# Patient Record
Sex: Female | Born: 1950
Health system: Southern US, Community
[De-identification: ages and names within clinical notes are randomized; demographics above are authoritative.]

## PROBLEM LIST (undated history)

## (undated) DIAGNOSIS — N393 Stress incontinence (female) (male): Secondary | ICD-10-CM

## (undated) DIAGNOSIS — E559 Vitamin D deficiency, unspecified: Secondary | ICD-10-CM

## (undated) DIAGNOSIS — M549 Dorsalgia, unspecified: Secondary | ICD-10-CM

## (undated) DIAGNOSIS — F32A Depression, unspecified: Secondary | ICD-10-CM

## (undated) DIAGNOSIS — H9190 Unspecified hearing loss, unspecified ear: Secondary | ICD-10-CM

## (undated) DIAGNOSIS — S62109A Fracture of unspecified carpal bone, unspecified wrist, initial encounter for closed fracture: Secondary | ICD-10-CM

## (undated) DIAGNOSIS — J189 Pneumonia, unspecified organism: Secondary | ICD-10-CM

## (undated) DIAGNOSIS — K219 Gastro-esophageal reflux disease without esophagitis: Secondary | ICD-10-CM

## (undated) DIAGNOSIS — M810 Age-related osteoporosis without current pathological fracture: Secondary | ICD-10-CM

## (undated) DIAGNOSIS — Z8709 Personal history of other diseases of the respiratory system: Secondary | ICD-10-CM

## (undated) DIAGNOSIS — M1990 Unspecified osteoarthritis, unspecified site: Secondary | ICD-10-CM

## (undated) DIAGNOSIS — J449 Chronic obstructive pulmonary disease, unspecified: Secondary | ICD-10-CM

## (undated) DIAGNOSIS — T7840XA Allergy, unspecified, initial encounter: Secondary | ICD-10-CM

## (undated) DIAGNOSIS — D689 Coagulation defect, unspecified: Secondary | ICD-10-CM

## (undated) DIAGNOSIS — K760 Fatty (change of) liver, not elsewhere classified: Secondary | ICD-10-CM

## (undated) DIAGNOSIS — F419 Anxiety disorder, unspecified: Secondary | ICD-10-CM

## (undated) DIAGNOSIS — E7800 Pure hypercholesterolemia, unspecified: Secondary | ICD-10-CM

## (undated) DIAGNOSIS — R7303 Prediabetes: Secondary | ICD-10-CM

## (undated) DIAGNOSIS — D649 Anemia, unspecified: Secondary | ICD-10-CM

## (undated) DIAGNOSIS — R112 Nausea with vomiting, unspecified: Secondary | ICD-10-CM

## (undated) DIAGNOSIS — I2699 Other pulmonary embolism without acute cor pulmonale: Secondary | ICD-10-CM

## (undated) DIAGNOSIS — K5900 Constipation, unspecified: Secondary | ICD-10-CM

## (undated) DIAGNOSIS — Z780 Asymptomatic menopausal state: Secondary | ICD-10-CM

## (undated) DIAGNOSIS — M2550 Pain in unspecified joint: Secondary | ICD-10-CM

## (undated) DIAGNOSIS — J329 Chronic sinusitis, unspecified: Secondary | ICD-10-CM

## (undated) DIAGNOSIS — T8859XA Other complications of anesthesia, initial encounter: Secondary | ICD-10-CM

## (undated) DIAGNOSIS — I10 Essential (primary) hypertension: Secondary | ICD-10-CM

## (undated) DIAGNOSIS — F1010 Alcohol abuse, uncomplicated: Secondary | ICD-10-CM

## (undated) DIAGNOSIS — T4145XA Adverse effect of unspecified anesthetic, initial encounter: Secondary | ICD-10-CM

## (undated) DIAGNOSIS — F909 Attention-deficit hyperactivity disorder, unspecified type: Secondary | ICD-10-CM

## (undated) DIAGNOSIS — Z87442 Personal history of urinary calculi: Secondary | ICD-10-CM

## (undated) DIAGNOSIS — Z973 Presence of spectacles and contact lenses: Secondary | ICD-10-CM

## (undated) DIAGNOSIS — I82409 Acute embolism and thrombosis of unspecified deep veins of unspecified lower extremity: Secondary | ICD-10-CM

## (undated) DIAGNOSIS — Z9889 Other specified postprocedural states: Secondary | ICD-10-CM

## (undated) DIAGNOSIS — F1910 Other psychoactive substance abuse, uncomplicated: Secondary | ICD-10-CM

## (undated) DIAGNOSIS — F329 Major depressive disorder, single episode, unspecified: Secondary | ICD-10-CM

## (undated) HISTORY — DX: Anemia, unspecified: D64.9

## (undated) HISTORY — DX: Fatty (change of) liver, not elsewhere classified: K76.0

## (undated) HISTORY — DX: Depression, unspecified: F32.A

## (undated) HISTORY — DX: Major depressive disorder, single episode, unspecified: F32.9

## (undated) HISTORY — DX: Alcohol abuse, uncomplicated: F10.10

## (undated) HISTORY — DX: Vitamin D deficiency, unspecified: E55.9

## (undated) HISTORY — DX: Dorsalgia, unspecified: M54.9

## (undated) HISTORY — PX: CYSTECTOMY: SUR359

## (undated) HISTORY — DX: Prediabetes: R73.03

## (undated) HISTORY — DX: Pain in unspecified joint: M25.50

## (undated) HISTORY — PX: CARPAL TUNNEL RELEASE: SHX101

## (undated) HISTORY — DX: Constipation, unspecified: K59.00

## (undated) HISTORY — DX: Coagulation defect, unspecified: D68.9

## (undated) HISTORY — DX: Chronic sinusitis, unspecified: J32.9

## (undated) HISTORY — PX: COLONOSCOPY: SHX174

## (undated) HISTORY — DX: Age-related osteoporosis without current pathological fracture: M81.0

## (undated) HISTORY — DX: Other psychoactive substance abuse, uncomplicated: F19.10

## (undated) HISTORY — DX: Essential (primary) hypertension: I10

## (undated) HISTORY — DX: Allergy, unspecified, initial encounter: T78.40XA

## (undated) HISTORY — DX: Fracture of unspecified carpal bone, unspecified wrist, initial encounter for closed fracture: S62.109A

## (undated) HISTORY — DX: Pure hypercholesterolemia, unspecified: E78.00

## (undated) HISTORY — DX: Asymptomatic menopausal state: Z78.0

## (undated) HISTORY — DX: Unspecified osteoarthritis, unspecified site: M19.90

## (undated) HISTORY — PX: COSMETIC SURGERY: SHX468

## (undated) HISTORY — DX: Anxiety disorder, unspecified: F41.9

---
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
## 1983-01-09 HISTORY — PX: BREAST SURGERY: SHX581

## 1997-07-02 ENCOUNTER — Ambulatory Visit (HOSPITAL_COMMUNITY): Admission: RE | Admit: 1997-07-02 | Discharge: 1997-07-02 | Payer: Self-pay | Admitting: Orthopedic Surgery

## 1997-11-10 ENCOUNTER — Ambulatory Visit (HOSPITAL_BASED_OUTPATIENT_CLINIC_OR_DEPARTMENT_OTHER): Admission: RE | Admit: 1997-11-10 | Discharge: 1997-11-10 | Payer: Self-pay | Admitting: Orthopedic Surgery

## 1998-01-08 HISTORY — PX: NASAL SINUS SURGERY: SHX719

## 1999-07-20 ENCOUNTER — Other Ambulatory Visit: Admission: RE | Admit: 1999-07-20 | Discharge: 1999-07-20 | Payer: Self-pay | Admitting: Gynecology

## 1999-08-02 ENCOUNTER — Other Ambulatory Visit: Admission: RE | Admit: 1999-08-02 | Discharge: 1999-08-02 | Payer: Self-pay | Admitting: Gynecology

## 1999-08-29 ENCOUNTER — Ambulatory Visit (HOSPITAL_COMMUNITY): Admission: RE | Admit: 1999-08-29 | Discharge: 1999-08-29 | Payer: Self-pay | Admitting: Gynecology

## 1999-08-29 ENCOUNTER — Encounter (INDEPENDENT_AMBULATORY_CARE_PROVIDER_SITE_OTHER): Payer: Self-pay | Admitting: Specialist

## 2000-09-27 ENCOUNTER — Other Ambulatory Visit: Admission: RE | Admit: 2000-09-27 | Discharge: 2000-09-27 | Payer: Self-pay | Admitting: Gynecology

## 2001-10-14 ENCOUNTER — Other Ambulatory Visit: Admission: RE | Admit: 2001-10-14 | Discharge: 2001-10-14 | Payer: Self-pay | Admitting: Gynecology

## 2003-02-02 ENCOUNTER — Other Ambulatory Visit: Admission: RE | Admit: 2003-02-02 | Discharge: 2003-02-02 | Payer: Self-pay | Admitting: Family Medicine

## 2003-02-05 ENCOUNTER — Encounter: Admission: RE | Admit: 2003-02-05 | Discharge: 2003-02-05 | Payer: Self-pay | Admitting: Family Medicine

## 2003-02-16 ENCOUNTER — Encounter: Payer: Self-pay | Admitting: Internal Medicine

## 2003-08-19 ENCOUNTER — Encounter: Admission: RE | Admit: 2003-08-19 | Discharge: 2003-08-19 | Payer: Self-pay | Admitting: Family Medicine

## 2005-06-06 ENCOUNTER — Ambulatory Visit: Payer: Self-pay | Admitting: Oncology

## 2005-06-21 LAB — CBC WITH DIFFERENTIAL/PLATELET
BASO%: 0.3 % (ref 0.0–2.0)
Basophils Absolute: 0 10*3/uL (ref 0.0–0.1)
EOS%: 2.9 % (ref 0.0–7.0)
Eosinophils Absolute: 0.1 10*3/uL (ref 0.0–0.5)
HCT: 34.6 % — ABNORMAL LOW (ref 34.8–46.6)
HGB: 11.5 g/dL — ABNORMAL LOW (ref 11.6–15.9)
LYMPH%: 33.3 % (ref 14.0–48.0)
MCH: 31 pg (ref 26.0–34.0)
MCHC: 33.2 g/dL (ref 32.0–36.0)
MCV: 93.6 fL (ref 81.0–101.0)
MONO#: 0.4 10*3/uL (ref 0.1–0.9)
MONO%: 8.3 % (ref 0.0–13.0)
NEUT#: 2.4 10*3/uL (ref 1.5–6.5)
NEUT%: 55.2 % (ref 39.6–76.8)
Platelets: 255 10*3/uL (ref 145–400)
RBC: 3.7 10*6/uL (ref 3.70–5.32)
RDW: 13.4 % (ref 11.3–14.5)
WBC: 4.3 10*3/uL (ref 3.9–10.0)
lymph#: 1.4 10*3/uL (ref 0.9–3.3)

## 2005-06-21 LAB — CHCC SMEAR

## 2005-06-25 LAB — COMPREHENSIVE METABOLIC PANEL
ALT: 15 U/L (ref 0–40)
AST: 18 U/L (ref 0–37)
Albumin: 4.4 g/dL (ref 3.5–5.2)
Alkaline Phosphatase: 66 U/L (ref 39–117)
BUN: 13 mg/dL (ref 6–23)
CO2: 29 mEq/L (ref 19–32)
Calcium: 9 mg/dL (ref 8.4–10.5)
Chloride: 103 mEq/L (ref 96–112)
Creatinine, Ser: 0.88 mg/dL (ref 0.40–1.20)
Glucose, Bld: 108 mg/dL — ABNORMAL HIGH (ref 70–99)
Potassium: 4.2 mEq/L (ref 3.5–5.3)
Sodium: 143 mEq/L (ref 135–145)
Total Bilirubin: 0.4 mg/dL (ref 0.3–1.2)
Total Protein: 6.7 g/dL (ref 6.0–8.3)

## 2005-06-25 LAB — IGG, IGA, IGM
IgA: 138 mg/dL (ref 68–378)
IgG (Immunoglobin G), Serum: 866 mg/dL (ref 694–1618)
IgM, Serum: 106 mg/dL (ref 60–263)

## 2005-06-25 LAB — LACTATE DEHYDROGENASE: LDH: 191 U/L (ref 94–250)

## 2005-06-25 LAB — HEMOGLOBINOPATHY EVALUATION
Hgb A2 Quant: 2.4 % (ref 2.2–3.2)
Hgb A: 97.6 % (ref 96.8–97.8)
Hgb F Quant: 0 % (ref 0.0–0.5)
Hgb S Quant: 0 % (ref 0.0–0.0)

## 2005-06-25 LAB — IRON AND TIBC
%SAT: 24 % (ref 20–55)
Iron: 84 ug/dL (ref 42–145)
TIBC: 353 ug/dL (ref 250–470)
UIBC: 269 ug/dL

## 2005-06-25 LAB — FERRITIN: Ferritin: 55 ng/mL (ref 10–291)

## 2005-06-25 LAB — ERYTHROPOIETIN: Erythropoietin: 21.2 m[IU]/mL (ref 2.6–34.0)

## 2005-06-25 LAB — FOLATE: Folate: 18.5 ng/mL

## 2005-12-10 ENCOUNTER — Ambulatory Visit: Payer: Self-pay | Admitting: Oncology

## 2005-12-12 LAB — CBC WITH DIFFERENTIAL/PLATELET
BASO%: 1.5 % (ref 0.0–2.0)
Basophils Absolute: 0.1 10*3/uL (ref 0.0–0.1)
EOS%: 2.7 % (ref 0.0–7.0)
Eosinophils Absolute: 0.2 10*3/uL (ref 0.0–0.5)
HCT: 34.5 % — ABNORMAL LOW (ref 34.8–46.6)
HGB: 11.6 g/dL (ref 11.6–15.9)
LYMPH%: 24.9 % (ref 14.0–48.0)
MCH: 32 pg (ref 26.0–34.0)
MCHC: 33.7 g/dL (ref 32.0–36.0)
MCV: 95 fL (ref 81.0–101.0)
MONO#: 0.6 10*3/uL (ref 0.1–0.9)
MONO%: 8.1 % (ref 0.0–13.0)
NEUT#: 4.5 10*3/uL (ref 1.5–6.5)
NEUT%: 62.8 % (ref 39.6–76.8)
Platelets: 269 10*3/uL (ref 145–400)
RBC: 3.64 10*6/uL — ABNORMAL LOW (ref 3.70–5.32)
RDW: 14.2 % (ref 11.3–14.5)
WBC: 7.2 10*3/uL (ref 3.9–10.0)
lymph#: 1.8 10*3/uL (ref 0.9–3.3)

## 2007-04-19 ENCOUNTER — Emergency Department (HOSPITAL_COMMUNITY): Admission: EM | Admit: 2007-04-19 | Discharge: 2007-04-20 | Payer: Self-pay | Admitting: Emergency Medicine

## 2007-04-25 ENCOUNTER — Encounter: Payer: Self-pay | Admitting: Internal Medicine

## 2007-04-25 ENCOUNTER — Ambulatory Visit (HOSPITAL_COMMUNITY): Admission: RE | Admit: 2007-04-25 | Discharge: 2007-04-25 | Payer: Self-pay | Admitting: Internal Medicine

## 2007-05-05 ENCOUNTER — Telehealth: Payer: Self-pay | Admitting: Internal Medicine

## 2007-05-06 ENCOUNTER — Ambulatory Visit: Payer: Self-pay | Admitting: Internal Medicine

## 2007-06-18 DIAGNOSIS — K449 Diaphragmatic hernia without obstruction or gangrene: Secondary | ICD-10-CM | POA: Insufficient documentation

## 2007-06-18 DIAGNOSIS — K7689 Other specified diseases of liver: Secondary | ICD-10-CM | POA: Insufficient documentation

## 2007-06-23 ENCOUNTER — Ambulatory Visit: Payer: Self-pay | Admitting: Internal Medicine

## 2007-12-27 ENCOUNTER — Emergency Department (HOSPITAL_COMMUNITY): Admission: EM | Admit: 2007-12-27 | Discharge: 2007-12-27 | Payer: Self-pay | Admitting: Emergency Medicine

## 2008-01-07 ENCOUNTER — Telehealth: Payer: Self-pay | Admitting: Internal Medicine

## 2008-02-04 ENCOUNTER — Telehealth: Payer: Self-pay | Admitting: Gastroenterology

## 2008-02-06 ENCOUNTER — Encounter: Admission: RE | Admit: 2008-02-06 | Discharge: 2008-02-06 | Payer: Self-pay | Admitting: Family Medicine

## 2008-02-06 ENCOUNTER — Telehealth: Payer: Self-pay | Admitting: Internal Medicine

## 2010-05-26 NOTE — Op Note (Signed)
[facility]  Patient:    Sharon Daniels, Sharon Daniels                  MRN: [id number] Proc. Date: 08/29/99 Adm. Date:  [id number] Attending:  Merrily Pew                           Operative Report  PREOPERATIVE DIAGNOSIS:       Menorrhagia.  POSTOPERATIVE DIAGNOSIS:      Endometrial polyp to rule out submucous myoma.  PROCEDURE:                    Hysteroscopic resection endometrial polyp.  SURGEON:                      Timothy P. Fontaine, M.D.  ANESTHESIA:                   General.  SPECIMEN:                     1. Endometrial polyps.                               2. Endometrial curetting.                               3. ______.  FLUIDS:                       Sorbitol discrepancy approximately 100 cc.  ESTIMATED BLOOD LOSS:         Minimal.  COMPLICATIONS:                None.  FINDINGS:                     Multiple endometrial polyps filling the endometrial cavity with some rounded polypoid changes consistent either with small submucous myomas versus sessile polyps.  At the end of the procedure cavity was empty.  Right and left tubal ostia identified.  Fundus, anterior/posterior surfaces, lower uterine segment, endometrial cervical canal all normal.  PROCEDURE:                    Patient underwent general anesthesia.  Was placed in the low dorsal lithotomy position.  Received a perineal vaginal preparation of Betadine scrub and solution and the bladder emptied with in-and-out Foley catheterization by nursing personnel.  Patient was draped in the usual fashion.  Cervix was visualized with a speculum and the anterior lip grasped with a single tooth tenaculum.  The cervix was gently dilated to admit the diagnostic hysteroscope and hysteroscopy was performed with findings of multiple polyps filling the cavity.  The diagnostic hysteroscope was removed and the operative hysteroscope was then replaced with a right angle resectoscopic loop  and through progressive passes the endometrial polyps and sessile changes were excised and sent as a separate specimen.  Subsequently, a sharp curettage was performed with abundant return.  This was initially sent as one specimen and then multiple passes with the polyp forceps removed the residual scrapings within the endometrial cavity and these were sent as a second specimen.  Rehysteroscopy showed an empty cavity with good distention. No evidence of perforation.  The instruments were removed.  Cervix showed adequate hemostasis.  Patient  subsequently awakened, placed in the supine position, and taken to recovery room in good condition having tolerated procedure well.  Of note, initial setup of the tubing from the patient was not connected to the aspirator and initial Sorbitol irrigation was on the operating floor leading to an estimation of the Sorbitol absorption which was reported at 100 cc, although most likely was less than. DD:  08/29/99 TD:  08/29/99 Job: [id number]

## 2010-05-26 NOTE — H&P (Signed)
[facility]  Patient:    Sharon Daniels, Sharon Daniels                    MRN: [id number] Adm. Date:  08/29/99 Attending:  Marcial Pacas P. Fontaine, M.D.                         History and Physical  CHIEF COMPLAINT:              Menorrhagia.  HISTORY OF PRESENT ILLNESS:   A 60 year old G3 P2 AB1 female with history of heavy menses and anemia over the last several months.  The patient underwent ultrasound, which showed a thickened endometrial cavity measuring 16 mm.  She is scheduled for a hysteroscopy D&C.  Patient had begun on Megace 20 mg b.i.d. and has been continued on this, due to her heavy bleeding with her anemia with a hemoglobin in the 9 range, and she is going to continue on this through her hysteroscopy D&C.  PAST SURGICAL HISTORY:        Cesarean section x 2, arthroscopic knee surgery, and sinus surgery.  MEDICATIONS:                  Flonase and Megace.  ALLERGIES:                    None.  HABITS:                       Cigarettes positive, a pack per day.  Alcohol - none.  REVIEW OF SYSTEMS:            Noncontributory.  FAMILY HISTORY:               Noncontributory, noting she does have a history of breast cancer in her mother.  ADMISSION PHYSICAL EXAMINATION:  VITAL SIGNS:                  Afebrile.  Vital signs are stable.  HEENT:                        Normal.  LUNGS:                        Clear.  CARDIAC:                      Regular rate, no rubs, murmurs, or gallops.  ABDOMEN:                      Benign.  PELVIC:                       External, BUS, vagina normal.  Cervix grossly normal.  Uterus normal size, nontender.  Adnexa without masses or tenderness.  ASSESSMENT:                   A 60 year old G3 P2 AB1 female, history of menorrhagia, with intermenstrual bleeding of the last several months, with heavy flow and anemia.  Currently on Megace for bleeding control.  For hysteroscopy D&C.  Patient had an ultrasound which  showed a thickened endometrial cavity, and the options and possibilities, to include submucous myoma, polyps, as well as hyperplastic changes, was discussed with her.  She did undergo a Pipelle biopsy in the office, which showed benign disordered proliferative pattern with  stromal breakdown.  There was one area of simple hyperplasia suggested, without atypia.  The procedure of hysteroscopy D&C was discussed with the patient, to include instrumentation, possible resectoscopic surgery.  The risks, to include bleeding, infection, transfusion, uterine perforation, damage to internal organs, necessitating major exploratory reparative surgeries and future reparative surgeries including ostomy formation was all discussed with her, understood, and accepted.  The patients questions were answered to her satisfaction, and she is ready to proceed with surgery.DD:  08/28/99 TD:  08/28/99 Job: [id number]

## 2010-10-03 LAB — CLOTEST (H. PYLORI), BIOPSY: Helicobacter screen: NEGATIVE

## 2010-10-03 LAB — COMPREHENSIVE METABOLIC PANEL
ALT: 18
AST: 33
Albumin: 4.1
Alkaline Phosphatase: 78
BUN: 9
CO2: 25
Calcium: 9.1
Chloride: 107
Creatinine, Ser: 0.67
GFR calc Af Amer: >60
GFR calc non Af Amer: >60
Glucose, Bld: 126 — ABNORMAL HIGH
Potassium: 4.1
Sodium: 138
Total Bilirubin: 0.9
Total Protein: 6.6

## 2010-10-03 LAB — POCT CARDIAC MARKERS
CKMB, poc: 1.7
Myoglobin, poc: 46.2
Operator id: 1192
Troponin i, poc: <0.05

## 2010-10-03 LAB — URINALYSIS, ROUTINE W REFLEX MICROSCOPIC
Bilirubin Urine: NEGATIVE
Glucose, UA: NEGATIVE
Hgb urine dipstick: NEGATIVE
Ketones, ur: 15 — AB
Nitrite: NEGATIVE
Protein, ur: NEGATIVE
Specific Gravity, Urine: 1.018
Urobilinogen, UA: 0.2
pH: 6.5

## 2010-10-03 LAB — LIPASE, BLOOD: Lipase: 22

## 2011-01-09 HISTORY — PX: COSMETIC SURGERY: SHX468

## 2012-03-30 ENCOUNTER — Ambulatory Visit (INDEPENDENT_AMBULATORY_CARE_PROVIDER_SITE_OTHER): Payer: 59 | Admitting: Family Medicine

## 2012-03-30 VITALS — BP 122/72 | HR 92 | Temp 98.1°F | Resp 16 | Ht 68.0 in | Wt 208.0 lb

## 2012-03-30 DIAGNOSIS — R059 Cough, unspecified: Secondary | ICD-10-CM

## 2012-03-30 DIAGNOSIS — R05 Cough: Secondary | ICD-10-CM

## 2012-03-30 DIAGNOSIS — J0100 Acute maxillary sinusitis, unspecified: Secondary | ICD-10-CM

## 2012-03-30 MED ORDER — IPRATROPIUM BROMIDE 0.03 % NA SOLN: 2.0000 | Freq: Two times a day (BID) | NASAL | Status: DC

## 2012-03-30 MED ORDER — FLUTICASONE PROPIONATE 50 MCG/ACT NA SUSP: 2.0000 | Freq: Every day | NASAL | Status: DC

## 2012-03-30 MED ORDER — AMOXICILLIN-POT CLAVULANATE 875-125 MG PO TABS: 1.0000 | ORAL_TABLET | Freq: Two times a day (BID) | ORAL | Status: DC

## 2012-03-30 NOTE — Progress Notes (Signed)
[address]   [place], [place]  [address]   [phone]  Subjective:    Patient ID: Sharon Daniels, female    DOB: 1950-10-22, 62 y.o.   MRN: [id number]  HPI This 62 y.o. female presents for evaluation of cough, congestion.  Onset of symptoms ten days ago.  History of sinus surgery in past.  No improvement.  +clammy; no fever; +sweats; + chills.  Slept all day yesterday.  +HA.  +ST constant initially but now intermittent.  No ear pain.  Scant rhinorrhea until started taking Mucinex.  +nasal congestion; drainage is thick.  +coughing with green sputum.  +PND.  Foul odor to sputum.  +SOB mild.  No wheezing; using inhaler twice from baseline.  No v/d.  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  No tobacco; quit 12/30/11; has gained eight pounds.  Taking Mucinex-D and Excedrin, Tylenol, Ibuprofen.  S/p flu vaccine.  Started Singulair two weeks ago.  No history of asthma but will wheeze in the Spring with allergies.  PCP:  Andy/[facility]  Review of Systems  Constitutional: Negative for chills, diaphoresis and fatigue.  HENT: Positive for congestion, rhinorrhea, sneezing, voice change, postnasal drip and sinus pressure. Negative for ear pain, sore throat and trouble swallowing.   Respiratory: Positive for cough and shortness of breath. Negative for wheezing.   Gastrointestinal: Negative for nausea, vomiting and diarrhea.  Skin: Negative for rash.  Neurological: Positive for headaches. Negative for dizziness and light-headedness.        Past Medical History  Diagnosis Date  . Allergy   . Arthritis   . Depression   . Substance abuse   . Anemia   . Hypertension     Past Surgical History  Procedure Laterality Date  . Cosmetic surgery      Prior to Admission medications   Medication Sig Start Date End Date Taking? Authorizing Provider  citalopram (CELEXA) 40 MG tablet Take 40 mg by mouth daily.   Yes Historical Provider, MD  estradiol (ESTRACE) 0.1 MG/GM vaginal cream Place 2 g vaginally  daily.   Yes Historical Provider, MD  valsartan (DIOVAN) 80 MG tablet Take 80 mg by mouth daily.   Yes Historical Provider, MD  amoxicillin-clavulanate (AUGMENTIN) 875-125 MG per tablet Take 1 tablet by mouth 2 (two) times daily. 03/30/12   Ethelda Chick, MD  fluticasone (FLONASE) 50 MCG/ACT nasal spray Place 2 sprays into the nose daily. 03/30/12   Ethelda Chick, MD  ipratropium (ATROVENT) 0.03 % nasal spray Place 2 sprays into the nose 2 (two) times daily. 03/30/12   Ethelda Chick, MD    Allergies  Allergen Reactions  . Morphine     REACTION: rash    History   Social History  . Marital Status: Married    Spouse Name: N/A    Number of Children: N/A  . Years of Education: N/A   Occupational History  . Social Worker    Social History Main Topics  . Smoking status: Former Smoker    Types: Cigarettes    Quit date: 12/30/2011  . Smokeless tobacco: Not on file  . Alcohol Use: No  . Drug Use: No  . Sexually Active: Not on file   Other Topics Concern  . Not on file   Social History Narrative  . No narrative on file    Family History  Problem Relation Age of Onset  . Kidney disease Mother   . Atrial fibrillation Mother   . Heart attack Father  Objective:   Physical Exam  Nursing note and vitals reviewed. Constitutional: She is oriented to person, place, and time. She appears well-developed and well-nourished. No distress.  HENT:  Head: Normocephalic and atraumatic.  Right Ear: External ear normal.  Left Ear: External ear normal.  Nose: Mucosal edema and rhinorrhea present. Right sinus exhibits maxillary sinus tenderness. Right sinus exhibits no frontal sinus tenderness. Left sinus exhibits maxillary sinus tenderness. Left sinus exhibits no frontal sinus tenderness.  Mouth/Throat: Oropharynx is clear and moist.  Eyes: Conjunctivae are normal. Pupils are equal, round, and reactive to light.  Neck: Normal range of motion. Neck supple. No thyromegaly present.    Cardiovascular: Normal rate, regular rhythm and normal heart sounds.  Exam reveals no gallop and no friction rub.   No murmur heard. Pulmonary/Chest: Effort normal and breath sounds normal. No respiratory distress. She has no wheezes. She has no rales.  Lymphadenopathy:    She has cervical adenopathy.  Neurological: She is alert and oriented to person, place, and time.  Skin: Skin is warm and dry. No rash noted. She is not diaphoretic.  Psychiatric: She has a normal mood and affect. Her behavior is normal.       Assessment & Plan:  Acute maxillary sinusitis - Plan: amoxicillin-clavulanate (AUGMENTIN) 875-125 MG per tablet, fluticasone (FLONASE) 50 MCG/ACT nasal spray, ipratropium (ATROVENT) 0.03 % nasal spray  Cough    1. Acute Maxillary Sinusitis:  New.  Rx for Augmentin, Flonase, Atrovent nasal spray provided.  Supportive care with rest, fluids. 2.  Cough:  New.  Continue Mucinex D; also continue Albuterol bid for next week.  Supportive care with rest, fluids.    Meds ordered this encounter  Medications  . citalopram (CELEXA) 40 MG tablet    Sig: Take 40 mg by mouth daily.  Marland Kitchen estradiol (ESTRACE) 0.1 MG/GM vaginal cream    Sig: Place 2 g vaginally daily.  . valsartan (DIOVAN) 80 MG tablet    Sig: Take 80 mg by mouth daily.  Marland Kitchen amoxicillin-clavulanate (AUGMENTIN) 875-125 MG per tablet    Sig: Take 1 tablet by mouth 2 (two) times daily.    Dispense:  20 tablet    Refill:  0  . fluticasone (FLONASE) 50 MCG/ACT nasal spray    Sig: Place 2 sprays into the nose daily.    Dispense:  16 g    Refill:  6  . ipratropium (ATROVENT) 0.03 % nasal spray    Sig: Place 2 sprays into the nose 2 (two) times daily.    Dispense:  30 mL    Refill:  5

## 2012-03-30 NOTE — Patient Instructions (Addendum)
Acute maxillary sinusitis - Plan: amoxicillin-clavulanate (AUGMENTIN) 875-125 MG per tablet, fluticasone (FLONASE) 50 MCG/ACT nasal spray, ipratropium (ATROVENT) 0.03 % nasal spray

## 2013-01-08 DIAGNOSIS — I82409 Acute embolism and thrombosis of unspecified deep veins of unspecified lower extremity: Secondary | ICD-10-CM

## 2013-01-08 HISTORY — DX: Acute embolism and thrombosis of unspecified deep veins of unspecified lower extremity: I82.409

## 2013-01-12 ENCOUNTER — Ambulatory Visit (INDEPENDENT_AMBULATORY_CARE_PROVIDER_SITE_OTHER): Payer: 59 | Admitting: Family Medicine

## 2013-01-12 DIAGNOSIS — Z713 Dietary counseling and surveillance: Secondary | ICD-10-CM

## 2013-01-14 ENCOUNTER — Encounter: Payer: Self-pay | Admitting: Internal Medicine

## 2013-05-09 ENCOUNTER — Other Ambulatory Visit: Payer: Self-pay | Admitting: Orthopedic Surgery

## 2013-05-25 ENCOUNTER — Encounter (HOSPITAL_COMMUNITY): Payer: Self-pay | Admitting: Pharmacy Technician

## 2013-05-27 ENCOUNTER — Ambulatory Visit (HOSPITAL_COMMUNITY)
Admission: RE | Admit: 2013-05-27 | Discharge: 2013-05-27 | Disposition: A | Discharge status: Home / Self Care | Payer: 59 | Source: Ambulatory Visit | Attending: Anesthesiology | Admitting: Anesthesiology

## 2013-05-27 ENCOUNTER — Encounter (HOSPITAL_COMMUNITY): Payer: Self-pay

## 2013-05-27 ENCOUNTER — Encounter (INDEPENDENT_AMBULATORY_CARE_PROVIDER_SITE_OTHER): Payer: Self-pay

## 2013-05-27 ENCOUNTER — Encounter (HOSPITAL_COMMUNITY)
Admission: RE | Admit: 2013-05-27 | Discharge: 2013-05-27 | Disposition: A | Discharge status: Home / Self Care | Payer: 59 | Source: Ambulatory Visit | Attending: Orthopedic Surgery | Admitting: Orthopedic Surgery

## 2013-05-27 DIAGNOSIS — Z01812 Encounter for preprocedural laboratory examination: Secondary | ICD-10-CM | POA: Insufficient documentation

## 2013-05-27 DIAGNOSIS — Z87891 Personal history of nicotine dependence: Secondary | ICD-10-CM | POA: Insufficient documentation

## 2013-05-27 DIAGNOSIS — Z01818 Encounter for other preprocedural examination: Secondary | ICD-10-CM | POA: Insufficient documentation

## 2013-05-27 DIAGNOSIS — Z01810 Encounter for preprocedural cardiovascular examination: Secondary | ICD-10-CM | POA: Insufficient documentation

## 2013-05-27 HISTORY — DX: Pneumonia, unspecified organism: J18.9

## 2013-05-27 HISTORY — DX: Adverse effect of unspecified anesthetic, initial encounter: T41.45XA

## 2013-05-27 HISTORY — DX: Personal history of other diseases of the respiratory system: Z87.09

## 2013-05-27 HISTORY — DX: Personal history of urinary calculi: Z87.442

## 2013-05-27 HISTORY — DX: Other complications of anesthesia, initial encounter: T88.59XA

## 2013-05-27 LAB — COMPREHENSIVE METABOLIC PANEL
ALT: 18 U/L (ref 0–35)
AST: 18 U/L (ref 0–37)
Albumin: 3.9 g/dL (ref 3.5–5.2)
Alkaline Phosphatase: 84 U/L (ref 39–117)
BUN: 19 mg/dL (ref 6–23)
CALCIUM: 9.2 mg/dL (ref 8.4–10.5)
CO2: 28 meq/L (ref 19–32)
Chloride: 104 mEq/L (ref 96–112)
Creatinine, Ser: 0.73 mg/dL (ref 0.50–1.10)
GLUCOSE: 99 mg/dL (ref 70–99)
Potassium: 4.4 mEq/L (ref 3.7–5.3)
Sodium: 142 mEq/L (ref 137–147)
Total Protein: 6.7 g/dL (ref 6.0–8.3)

## 2013-05-27 LAB — PROTIME-INR
INR: 1.01 (ref 0.00–1.49)
Prothrombin Time: 13.1 seconds (ref 11.6–15.2)

## 2013-05-27 LAB — APTT: aPTT: 28 seconds (ref 24–37)

## 2013-05-27 LAB — CBC
HCT: 32.9 % — ABNORMAL LOW (ref 36.0–46.0)
HEMOGLOBIN: 10.9 g/dL — AB (ref 12.0–15.0)
MCH: 31.1 pg (ref 26.0–34.0)
MCHC: 33.1 g/dL (ref 30.0–36.0)
MCV: 93.7 fL (ref 78.0–100.0)
PLATELETS: 235 10*3/uL (ref 150–400)
RBC: 3.51 MIL/uL — ABNORMAL LOW (ref 3.87–5.11)
RDW: 13.9 % (ref 11.5–15.5)
WBC: 5.8 10*3/uL (ref 4.0–10.5)

## 2013-05-27 LAB — URINALYSIS, ROUTINE W REFLEX MICROSCOPIC
Bilirubin Urine: NEGATIVE
GLUCOSE, UA: NEGATIVE mg/dL
Hgb urine dipstick: NEGATIVE
Ketones, ur: NEGATIVE mg/dL
LEUKOCYTES UA: NEGATIVE
NITRITE: NEGATIVE
PROTEIN: NEGATIVE mg/dL
Specific Gravity, Urine: 1.025 (ref 1.005–1.030)
Urobilinogen, UA: 0.2 mg/dL (ref 0.0–1.0)
pH: 5.5 (ref 5.0–8.0)

## 2013-05-27 LAB — SURGICAL PCR SCREEN
MRSA, PCR: NEGATIVE
Staphylococcus aureus: NEGATIVE

## 2013-05-27 NOTE — Progress Notes (Signed)
Surgery clearance note 04/28/13 Dr. Ernie Hew on chart

## 2013-05-27 NOTE — Patient Instructions (Signed)
20 Sharon Daniels  05/27/2013   Your procedure is scheduled on: 06/05/13  Report to [facility] at 7:15 AM.  Call this number if you have problems the morning of surgery [phone]: [phone]   Remember:   Do not eat food or drink liquids After Midnight.     Take these medicines the morning of surgery with A SIP OF WATER: omeprazole, claritin, flonase, singulair   Do not wear jewelry, make-up or nail polish.  Do not wear lotions, powders, or perfumes. You may wear deodorant.  Do not shave 48 hours prior to surgery. Men may shave face and neck.  Do not bring valuables to the hospital.  Contacts, dentures or bridgework may not be worn into surgery.  Leave suitcase in the car. After surgery it may be brought to your room.  For patients admitted to the hospital, checkout time is 11:00 AM the day of discharge.   Please read over the following fact sheets that you were given: MRSA Information Paulette Blanch, RN  pre op nurse call if needed [phone]    [facility] - Preparing for Surgery Before surgery, you can play an important role.  Because skin is not sterile, your skin needs to be as free of germs as possible.  You can reduce the number of germs on your skin by washing with CHG (chlorahexidine gluconate) soap before surgery.  CHG is an antiseptic cleaner which kills germs and bonds with the skin to continue killing germs even after washing. Please DO NOT use if you have an allergy to CHG or antibacterial soaps.  If your skin becomes reddened/irritated stop using the CHG and inform your nurse when you arrive at Short Stay. Do not shave (including legs and underarms) for at least 48 hours prior to the first CHG shower.  You may shave your face/neck. Please follow these instructions carefully:  1.  Shower with CHG Soap the night before surgery and the  morning of Surgery.  2.  If you choose to wash your hair, wash your hair first as usual with your  normal  Shampoo.  3.   After you shampoo, rinse your hair and body thoroughly to remove the  shampoo.                            4.  Use CHG as you would any other liquid soap.  You can apply chg directly  to the skin and wash                       Gently with a scrungie or clean washcloth.  5.  Apply the CHG Soap to your body ONLY FROM THE NECK DOWN.   Do not use on face/ open                           Wound or open sores. Avoid contact with eyes, ears mouth and genitals (private parts).                       Wash face,  Genitals (private parts) with your normal soap.             6.  Wash thoroughly, paying special attention to the area where your surgery  will be performed.  7.  Thoroughly rinse your body with warm water from the neck down.  8.  DO NOT shower/wash with your normal soap after using and rinsing off  the CHG Soap.                9.  Pat yourself dry with a clean towel.            10.  Wear clean pajamas.            11.  Place clean sheets on your bed the night of your first shower and do not  sleep with pets. Day of Surgery : Do not apply any lotions/deodorants the morning of surgery.  Please wear clean clothes to the hospital/surgery center.  FAILURE TO FOLLOW THESE INSTRUCTIONS MAY RESULT IN THE CANCELLATION OF YOUR SURGERY PATIENT SIGNATURE_________________________________  NURSE SIGNATURE__________________________________  ________________________________________________________________________  WHAT IS A BLOOD TRANSFUSION? Blood Transfusion Information  A transfusion is the replacement of blood or some of its parts. Blood is made up of multiple cells which provide different functions.  Red blood cells carry oxygen and are used for blood loss replacement.  White blood cells fight against infection.  Platelets control bleeding.  Plasma helps clot blood.  Other blood products are available for specialized needs, such as hemophilia or other clotting disorders. BEFORE THE TRANSFUSION   Who gives blood for transfusions?   Healthy volunteers who are fully evaluated to make sure their blood is safe. This is blood bank blood. Transfusion therapy is the safest it has ever been in the practice of medicine. Before blood is taken from a donor, a complete history is taken to make sure that person has no history of diseases nor engages in risky social behavior (examples are intravenous drug use or sexual activity with multiple partners). The donor's travel history is screened to minimize risk of transmitting infections, such as malaria. The donated blood is tested for signs of infectious diseases, such as HIV and hepatitis. The blood is then tested to be sure it is compatible with you in order to minimize the chance of a transfusion reaction. If you or a relative donates blood, this is often done in anticipation of surgery and is not appropriate for emergency situations. It takes many days to process the donated blood. RISKS AND COMPLICATIONS Although transfusion therapy is very safe and saves many lives, the main dangers of transfusion include:   Getting an infectious disease.  Developing a transfusion reaction. This is an allergic reaction to something in the blood you were given. Every precaution is taken to prevent this. The decision to have a blood transfusion has been considered carefully by your caregiver before blood is given. Blood is not given unless the benefits outweigh the risks. AFTER THE TRANSFUSION  Right after receiving a blood transfusion, you will usually feel much better and more energetic. This is especially true if your red blood cells have gotten low (anemic). The transfusion raises the level of the red blood cells which carry oxygen, and this usually causes an energy increase.  The nurse administering the transfusion will monitor you carefully for complications. HOME CARE INSTRUCTIONS  No special instructions are needed after a transfusion. You may find your energy  is better. Speak with your caregiver about any limitations on activity for underlying diseases you may have. SEEK MEDICAL CARE IF:   Your condition is not improving after your transfusion.  You develop redness or irritation at the intravenous (IV) site. SEEK IMMEDIATE MEDICAL CARE IF:  Any of the following symptoms occur over the next 12 hours:  Shaking chills.  You  have a temperature by mouth above 102 F (38.9 C), not controlled by medicine.  Chest, back, or muscle pain.  People around you feel you are not acting correctly or are confused.  Shortness of breath or difficulty breathing.  Dizziness and fainting.  You get a rash or develop hives.  You have a decrease in urine output.  Your urine turns a dark color or changes to pink, red, or brown. Any of the following symptoms occur over the next 10 days:  You have a temperature by mouth above 102 F (38.9 C), not controlled by medicine.  Shortness of breath.  Weakness after normal activity.  The white part of the eye turns yellow (jaundice).  You have a decrease in the amount of urine or are urinating less often.  Your urine turns a dark color or changes to pink, red, or brown. Document Released: 12/23/1999 Document Revised: 03/19/2011 Document Reviewed: 08/11/2007 ExitCare Patient Information 2014 [place].  _______________________________________________________________________  Incentive Spirometer  An incentive spirometer is a tool that can help keep your lungs clear and active. This tool measures how well you are filling your lungs with each breath. Taking long deep breaths may help reverse or decrease the chance of developing breathing (pulmonary) problems (especially infection) following:  A long period of time when you are unable to move or be active. BEFORE THE PROCEDURE   If the spirometer includes an indicator to show your best effort, your nurse or respiratory therapist will set it to a desired  goal.  If possible, sit up straight or lean slightly forward. Try not to slouch.  Hold the incentive spirometer in an upright position. INSTRUCTIONS FOR USE  1. Sit on the edge of your bed if possible, or sit up as far as you can in bed or on a chair. 2. Hold the incentive spirometer in an upright position. 3. Breathe out normally. 4. Place the mouthpiece in your mouth and seal your lips tightly around it. 5. Breathe in slowly and as deeply as possible, raising the piston or the ball toward the top of the column. 6. Hold your breath for 3-5 seconds or for as long as possible. Allow the piston or ball to fall to the bottom of the column. 7. Remove the mouthpiece from your mouth and breathe out normally. 8. Rest for a few seconds and repeat Steps 1 through 7 at least 10 times every 1-2 hours when you are awake. Take your time and take a few normal breaths between deep breaths. 9. The spirometer may include an indicator to show your best effort. Use the indicator as a goal to work toward during each repetition. 10. After each set of 10 deep breaths, practice coughing to be sure your lungs are clear. If you have an incision (the cut made at the time of surgery), support your incision when coughing by placing a pillow or rolled up towels firmly against it. Once you are able to get out of bed, walk around indoors and cough well. You may stop using the incentive spirometer when instructed by your caregiver.  RISKS AND COMPLICATIONS  Take your time so you do not get dizzy or light-headed.  If you are in pain, you may need to take or ask for pain medication before doing incentive spirometry. It is harder to take a deep breath if you are having pain. AFTER USE  Rest and breathe slowly and easily.  It can be helpful to keep track of a log of your  progress. Your caregiver can provide you with a simple table to help with this. If you are using the spirometer at home, follow these instructions: [place] IF:   You are having difficultly using the spirometer.  You have trouble using the spirometer as often as instructed.  Your pain medication is not giving enough relief while using the spirometer.  You develop fever of 100.5 F (38.1 C) or higher. SEEK IMMEDIATE MEDICAL CARE IF:   You cough up bloody sputum that had not been present before.  You develop fever of 102 F (38.9 C) or greater.  You develop worsening pain at or near the incision site. MAKE SURE YOU:   Understand these instructions.  Will watch your condition.  Will get help right away if you are not doing well or get worse. Document Released: 05/07/2006 Document Revised: 03/19/2011 Document Reviewed: 07/08/2006 [facility] Patient Information 2014 [place], [place].   ________________________________________________________________________

## 2013-06-01 ENCOUNTER — Other Ambulatory Visit: Payer: Self-pay | Admitting: Orthopedic Surgery

## 2013-06-01 NOTE — H&P (Signed)
Sharon Daniels. [name] DOB: 1950/04/23 Married / Language: English / Race: White Female  Date of Admission:  06-05-2013  Chief Complaint:  Bilateral Knee Pain  History of Present Illness The patient is a 63 year old female who comes in for a preoperative History and Physical. The patient is scheduled for a bilateral total knee arthroplasty to be performed by Dr. Dione Plover. Aluisio, MD at [facility] on 06-05-2013. The patient is a 63 year old female who presents for follow up of their knee. The patient is being followed for their bilateral knee pain and osteoarthritis. They are now 7 year(s) out from the last visit. Symptoms reported today include: pain. The patient feels that they are doing poorly and report their pain level to be moderate. Current treatment includes: activity modification. The following medication has been used for pain control: antiinflammatory medication (Aleve). Note for "Follow-up Knee": She has been seeing her PCP over the years and has had a series of viscosupplementation, which helped some. It has been two years since her last injections with her PCP. She states that both knees hurt terribly at this time. They are limiting what she can and cannot do. She has had cortisone and viscosupplement injections with minimal benefit. She is at a stage where the knees are effectively taking over her life and she would like to do something to make herself more active again and to get a more pain-free lifestyle.  She is ready to proceed with surgery.  Risks and benefits of proceeding with one versus both of the knees has been discussed with the patient.and she would like to proceed with bilateral knee replacements. They have been treated conservatively in the past for the above stated problem and despite conservative measures, they continue to have progressive pain and severe functional limitations and dysfunction. They have failed non-operative management including home  exercise, medications, and injections. It is felt that they would benefit from undergoing total joint replacements. Risks and benefits of the procedure have been discussed with the patient and they elect to proceed with surgery. There are no active contraindications to surgery such as ongoing infection or rapidly progressive neurological disease.   Allergies Morphine Sulfate *ANALGESICS - OPIOID*. Itching.   Problem List/Past Medical Primary osteoarthritis of both knees (715.16  M17.0) Osteoarthritis Depression High blood pressure Kidney Stone Anemia Measles Bronchitis. Past History Pneumonia. Past History Alcoholism. Treatment 1992  Family History Chronic Obstructive Lung Disease. mother Depression. mother Drug / Alcohol Addiction. mother Cancer. mother Heart Disease. father Hypertension. mother Osteoporosis. mother   Social History Drug/Alcohol Rehab (Previously). yes Exercise. Exercises weekly; does gym / weights Illicit drug use. no Drug/Alcohol Rehab (Currently). no Alcohol use. current drinker; drinks beer, wine and hard liquor; 15 or more per week Children. 2 Current work status. working part time Tobacco use. former smoker; smoke(d) 1 pack(s) per day Living situation. live with spouse Marital status. married Pain Contract. no    Medication History Aleve (220MG  Tablet, Oral) Active. Valsartan (80MG  Tablet, Oral) Active. Montelukast Sodium (10MG  Tablet, Oral) Active. Claritin (10MG  Tablet, Oral) Active. Combivent Respimat (20-100MCG/ACT Aerosol Soln, Inhalation) Active. Estrace (0.1MG /GM Cream, Vaginal) Active. Primrose Oil Active. Flonase (50MCG/ACT Suspension, Nasal) Active. Magnesium Gluconate (250MG  Tablet, Oral) Active. Metamucil (0.52GM Capsule, Oral) Active. Multivital ( Oral) Active. Nystatin (Mouth-Throat) ( Mouth/Throat) Active. Omeprazole (40MG  Capsule DR, Oral) Active. Potassium (99MG  Tablet, Oral)  Active. Tumeric Active. Vitamin C (500MG  Tablet Chewable, Oral) Active. Vitamin D3 (5000UNIT Capsule, Oral) Active.   Past Surgical History Carpal  Tunnel Repair. right Cesarean Delivery. 2 times Sinus Surgery    Review of Systems General:Not Present- Chills, Fever, Night Sweats, Fatigue, Weight Gain, Weight Loss and Memory Loss. Skin:Not Present- Hives, Itching, Rash, Eczema and Lesions. HEENT:Not Present- Tinnitus, Headache, Double Vision, Visual Loss, Hearing Loss and Dentures. Respiratory:Not Present- Shortness of breath with exertion, Shortness of breath at rest, Allergies, Coughing up blood and Chronic Cough. Cardiovascular:Not Present- Chest Pain, Racing/skipping heartbeats, Difficulty Breathing Lying Down, Murmur, Swelling and Palpitations. Gastrointestinal:Not Present- Bloody Stool, Heartburn, Abdominal Pain, Vomiting, Nausea, Constipation, Diarrhea, Difficulty Swallowing, Jaundice and Loss of appetitie. Female Genitourinary:Not Present- Blood in Urine, Urinary frequency, Weak urinary stream, Discharge, Flank Pain, Incontinence, Painful Urination, Urgency, Urinary Retention and Urinating at Night. Musculoskeletal:Not Present- Muscle Weakness, Muscle Pain, Joint Swelling, Joint Pain, Back Pain, Morning Stiffness and Spasms. Neurological:Not Present- Tremor, Dizziness, Blackout spells, Paralysis, Difficulty with balance and Weakness. Psychiatric:Not Present- Insomnia.    Vitals Pulse: 84 (Regular) Resp.: 14 (Unlabored) BP: 122/78 (Sitting, Right Arm, Standard)   Physical Exam The physical exam findings are as follows:   General Mental Status - Alert, cooperative and good historian. General Appearance- pleasant. Not in acute distress. Orientation- Oriented X3. Build & Nutrition- Well nourished and Well developed.   Head and Neck Head- normocephalic, atraumatic . Neck Global Assessment- supple. no bruit auscultated on the right and  no bruit auscultated on the left.   Eye Vision- Wears corrective lenses. Pupil- Bilateral- Regular and Round. Motion- Bilateral- EOMI.   Chest and Lung Exam Auscultation: Breath sounds:- clear at anterior chest wall and - clear at posterior chest wall. Adventitious sounds:- No Adventitious sounds.   Cardiovascular Auscultation:Rhythm- Regular rate and rhythm. Heart Sounds- S1 WNL and S2 WNL. Murmurs & Other Heart Sounds:Auscultation of the heart reveals - No Murmurs.   Abdomen Inspection:Contour- Generalized mild distention. Palpation/Percussion:Tenderness- Abdomen is non-tender to palpation. Rigidity (guarding)- Abdomen is soft. Auscultation:Auscultation of the abdomen reveals - Bowel sounds normal.   Female Genitourinary Not done, not pertinent to present illness  Musculoskeletal On exam she is alert and oriented in no apparent distress. Her hips show normal range of motion with no discomfort. Both knees show no effusion.  Left knee range is about 5-125 degrees. Moderate crepitus on range of motion. Tenderness medial greater than lateral with no instability.  Right knee with no effusion. Range 5-120 degrees. Moderate crepitus on range of motion. Tenderness medial greater than lateral with no instability. Pulses, sensation, and motor intact in both lower extremities.  RADIOGRAPHS AP of both knees and lateral show severe end stage arthritic change in both knees, medial and patellofemoral with significant varus deformity and lateral subluxation of both tibias. The right side may be slightly worse than the left, but not a tremendous amount.   Assessment & Plan Primary osteoarthritis of both knees (715.16  M17.0)  Note: Plan is for a Bilateral Total Knee Replacements by Dr. Wynelle Link.  Plan is to go to [facility].  PCP - Dr. Ernie Hew - Patient has been seen preoperatively and felt to be stable for surgery.  The patient does not have any contraindications and  will receive TXA (tranexamic acid) prior to surgery.  Signed electronically by Joelene Millin, III PA-C

## 2013-06-05 ENCOUNTER — Encounter (HOSPITAL_COMMUNITY)
Admission: RE | Disposition: A | Discharge status: Rehabilitation Facility | Payer: Self-pay | Source: Ambulatory Visit | Attending: Orthopedic Surgery

## 2013-06-05 ENCOUNTER — Encounter (HOSPITAL_COMMUNITY): Payer: 59 | Admitting: Certified Registered"

## 2013-06-05 ENCOUNTER — Inpatient Hospital Stay (HOSPITAL_COMMUNITY): Payer: 59 | Admitting: Certified Registered"

## 2013-06-05 ENCOUNTER — Encounter (HOSPITAL_COMMUNITY): Payer: Self-pay | Admitting: *Deleted

## 2013-06-05 ENCOUNTER — Inpatient Hospital Stay (HOSPITAL_COMMUNITY)
Admission: RE | Admit: 2013-06-05 | Discharge: 2013-06-09 | DRG: 462 | Disposition: A | Discharge status: Rehabilitation Facility | Payer: 59 | Source: Ambulatory Visit | Attending: Orthopedic Surgery | Admitting: Orthopedic Surgery

## 2013-06-05 DIAGNOSIS — D62 Acute posthemorrhagic anemia: Secondary | ICD-10-CM | POA: Diagnosis not present

## 2013-06-05 DIAGNOSIS — Z6831 Body mass index (BMI) 31.0-31.9, adult: Secondary | ICD-10-CM

## 2013-06-05 DIAGNOSIS — Z01812 Encounter for preprocedural laboratory examination: Secondary | ICD-10-CM

## 2013-06-05 DIAGNOSIS — Z87891 Personal history of nicotine dependence: Secondary | ICD-10-CM

## 2013-06-05 DIAGNOSIS — Z96653 Presence of artificial knee joint, bilateral: Secondary | ICD-10-CM

## 2013-06-05 DIAGNOSIS — M179 Osteoarthritis of knee, unspecified: Secondary | ICD-10-CM | POA: Diagnosis present

## 2013-06-05 DIAGNOSIS — Z8249 Family history of ischemic heart disease and other diseases of the circulatory system: Secondary | ICD-10-CM

## 2013-06-05 DIAGNOSIS — I10 Essential (primary) hypertension: Secondary | ICD-10-CM | POA: Diagnosis present

## 2013-06-05 DIAGNOSIS — Z9289 Personal history of other medical treatment: Secondary | ICD-10-CM

## 2013-06-05 DIAGNOSIS — Z79899 Other long term (current) drug therapy: Secondary | ICD-10-CM

## 2013-06-05 DIAGNOSIS — Z87442 Personal history of urinary calculi: Secondary | ICD-10-CM

## 2013-06-05 DIAGNOSIS — M1710 Unilateral primary osteoarthritis, unspecified knee: Principal | ICD-10-CM | POA: Diagnosis present

## 2013-06-05 HISTORY — PX: TOTAL KNEE ARTHROPLASTY: SHX125

## 2013-06-05 LAB — ABO/RH: ABO/RH(D): A POS

## 2013-06-05 SURGERY — ARTHROPLASTY, KNEE, TOTAL
Anesthesia: Epidural | Site: Knee | Laterality: Bilateral

## 2013-06-05 MED ORDER — PHENYLEPHRINE HCL 10 MG/ML IJ SOLN
INTRAMUSCULAR | Status: DC | PRN
  Administered 2013-06-05 (×3): 80 ug via INTRAVENOUS

## 2013-06-05 MED ORDER — PROMETHAZINE HCL 25 MG/ML IJ SOLN: 6.2500 mg | INTRAMUSCULAR | Status: DC | PRN

## 2013-06-05 MED ORDER — OXYCODONE HCL 5 MG PO TABS
5.0000 mg | ORAL_TABLET | ORAL | Status: DC | PRN
  Administered 2013-06-05: 10 mg via ORAL
  Administered 2013-06-05: 5 mg via ORAL
  Administered 2013-06-05 – 2013-06-09 (×27): 10 mg via ORAL
  Filled 2013-06-05 (×9): qty 2
  Filled 2013-06-05: qty 1
  Filled 2013-06-05 (×20): qty 2

## 2013-06-05 MED ORDER — DEXAMETHASONE SODIUM PHOSPHATE 10 MG/ML IJ SOLN
10.0000 mg | Freq: Once | INTRAMUSCULAR | Status: AC
  Administered 2013-06-05: 10 mg via INTRAVENOUS

## 2013-06-05 MED ORDER — LACTATED RINGERS IV SOLN: INTRAVENOUS | Status: DC

## 2013-06-05 MED ORDER — EPHEDRINE SULFATE 50 MG/ML IJ SOLN
INTRAMUSCULAR | Status: DC | PRN
  Administered 2013-06-05: 10 mg via INTRAVENOUS

## 2013-06-05 MED ORDER — ACETAMINOPHEN 650 MG RE SUPP
650.0000 mg | Freq: Four times a day (QID) | RECTAL | Status: DC | PRN
Start: 2013-06-05 — End: 2013-06-09

## 2013-06-05 MED ORDER — SODIUM CHLORIDE 0.9 % IR SOLN
Status: DC | PRN
  Administered 2013-06-05: 1000 mL

## 2013-06-05 MED ORDER — POLYETHYLENE GLYCOL 3350 17 G PO PACK
17.0000 g | PACK | Freq: Every day | ORAL | Status: DC | PRN
  Administered 2013-06-06 – 2013-06-07 (×2): 17 g via ORAL

## 2013-06-05 MED ORDER — CEFAZOLIN SODIUM-DEXTROSE 2-3 GM-% IV SOLR
2.0000 g | Freq: Four times a day (QID) | INTRAVENOUS | Status: AC
  Administered 2013-06-05 (×2): 2 g via INTRAVENOUS
  Filled 2013-06-05 (×2): qty 50

## 2013-06-05 MED ORDER — SODIUM BICARBONATE 8.4 % IV SOLN
INTRAVENOUS | Status: DC | PRN
  Administered 2013-06-05: 16 mL via EPIDURAL
  Administered 2013-06-05: 6 mL via EPIDURAL
  Administered 2013-06-05: 7 mL via EPIDURAL

## 2013-06-05 MED ORDER — METOCLOPRAMIDE HCL 10 MG PO TABS: 5.0000 mg | ORAL_TABLET | Freq: Three times a day (TID) | ORAL | Status: DC | PRN

## 2013-06-05 MED ORDER — NYSTATIN 100000 UNIT/ML MT SUSP
5.0000 mL | Freq: Every day | OROMUCOSAL | Status: DC
  Administered 2013-06-05 – 2013-06-09 (×5): 500000 [IU] via ORAL
  Filled 2013-06-05 (×5): qty 5

## 2013-06-05 MED ORDER — DEXAMETHASONE SODIUM PHOSPHATE 10 MG/ML IJ SOLN
INTRAMUSCULAR | Status: AC
  Filled 2013-06-05: qty 1

## 2013-06-05 MED ORDER — TRANEXAMIC ACID 100 MG/ML IV SOLN
1000.0000 mg | INTRAVENOUS | Status: AC
  Administered 2013-06-05: 1000 mg via INTRAVENOUS
  Filled 2013-06-05: qty 10

## 2013-06-05 MED ORDER — PROPOFOL 10 MG/ML IV BOLUS
INTRAVENOUS | Status: AC
  Filled 2013-06-05: qty 20

## 2013-06-05 MED ORDER — HYDROMORPHONE HCL PF 1 MG/ML IJ SOLN
0.2500 mg | INTRAMUSCULAR | Status: DC | PRN
  Administered 2013-06-05 (×4): 0.25 mg via INTRAVENOUS

## 2013-06-05 MED ORDER — MONTELUKAST SODIUM 10 MG PO TABS
10.0000 mg | ORAL_TABLET | Freq: Every day | ORAL | Status: DC
Start: 2013-06-05 — End: 2013-06-09
  Administered 2013-06-05 – 2013-06-09 (×5): 10 mg via ORAL
  Filled 2013-06-05 (×5): qty 1

## 2013-06-05 MED ORDER — FENTANYL CITRATE 0.05 MG/ML IJ SOLN
INTRAMUSCULAR | Status: AC
  Filled 2013-06-05: qty 2

## 2013-06-05 MED ORDER — PHENOL 1.4 % MT LIQD
1.0000 | OROMUCOSAL | Status: DC | PRN
  Filled 2013-06-05: qty 177

## 2013-06-05 MED ORDER — BISACODYL 10 MG RE SUPP
10.0000 mg | Freq: Every day | RECTAL | Status: DC | PRN
  Administered 2013-06-08: 10 mg via RECTAL
  Filled 2013-06-05: qty 1

## 2013-06-05 MED ORDER — MIDAZOLAM HCL 5 MG/5ML IJ SOLN
INTRAMUSCULAR | Status: DC | PRN
  Administered 2013-06-05 (×4): 1 mg via INTRAVENOUS

## 2013-06-05 MED ORDER — IRBESARTAN 75 MG PO TABS
75.0000 mg | ORAL_TABLET | Freq: Every day | ORAL | Status: DC
  Administered 2013-06-06 – 2013-06-09 (×4): 75 mg via ORAL
  Filled 2013-06-05 (×5): qty 1

## 2013-06-05 MED ORDER — LACTATED RINGERS IV SOLN
INTRAVENOUS | Status: DC | PRN
  Administered 2013-06-05 (×2): via INTRAVENOUS

## 2013-06-05 MED ORDER — MIDAZOLAM HCL 2 MG/2ML IJ SOLN
INTRAMUSCULAR | Status: AC
  Filled 2013-06-05: qty 2

## 2013-06-05 MED ORDER — NYSTATIN 100000 UNIT/GM EX POWD
Freq: Two times a day (BID) | CUTANEOUS | Status: DC
  Administered 2013-06-05 – 2013-06-09 (×8): via TOPICAL
  Filled 2013-06-05: qty 15

## 2013-06-05 MED ORDER — PHENYLEPHRINE 40 MCG/ML (10ML) SYRINGE FOR IV PUSH (FOR BLOOD PRESSURE SUPPORT)
PREFILLED_SYRINGE | INTRAVENOUS | Status: AC
  Filled 2013-06-05: qty 10

## 2013-06-05 MED ORDER — ONDANSETRON HCL 4 MG PO TABS: 4.0000 mg | ORAL_TABLET | Freq: Four times a day (QID) | ORAL | Status: DC | PRN

## 2013-06-05 MED ORDER — DEXTROSE 5 % IV SOLN
500.0000 mg | Freq: Four times a day (QID) | INTRAVENOUS | Status: DC | PRN
  Administered 2013-06-05: 500 mg via INTRAVENOUS
  Filled 2013-06-05: qty 5

## 2013-06-05 MED ORDER — IPRATROPIUM-ALBUTEROL 20-100 MCG/ACT IN AERS: 1.0000 | INHALATION_SPRAY | Freq: Every day | RESPIRATORY_TRACT | Status: DC

## 2013-06-05 MED ORDER — 0.9 % SODIUM CHLORIDE (POUR BTL) OPTIME
TOPICAL | Status: DC | PRN
  Administered 2013-06-05: 1000 mL

## 2013-06-05 MED ORDER — ACETAMINOPHEN 325 MG PO TABS
650.0000 mg | ORAL_TABLET | Freq: Four times a day (QID) | ORAL | Status: DC | PRN
  Administered 2013-06-07 – 2013-06-08 (×3): 650 mg via ORAL
  Filled 2013-06-05 (×3): qty 2

## 2013-06-05 MED ORDER — PROPOFOL INFUSION 10 MG/ML OPTIME
INTRAVENOUS | Status: DC | PRN
  Administered 2013-06-05: 100 ug/kg/min via INTRAVENOUS

## 2013-06-05 MED ORDER — ONDANSETRON HCL 4 MG/2ML IJ SOLN
INTRAMUSCULAR | Status: AC
  Filled 2013-06-05: qty 2

## 2013-06-05 MED ORDER — HYDROMORPHONE HCL PF 1 MG/ML IJ SOLN
0.5000 mg | INTRAMUSCULAR | Status: DC | PRN
  Administered 2013-06-05: 1 mg via INTRAVENOUS
  Administered 2013-06-05 (×3): 0.5 mg via INTRAVENOUS
  Administered 2013-06-06 – 2013-06-07 (×2): 1 mg via INTRAVENOUS
  Filled 2013-06-05 (×6): qty 1

## 2013-06-05 MED ORDER — IPRATROPIUM-ALBUTEROL 0.5-2.5 (3) MG/3ML IN SOLN
3.0000 mL | Freq: Every day | RESPIRATORY_TRACT | Status: DC
  Administered 2013-06-05 – 2013-06-09 (×5): 3 mL via RESPIRATORY_TRACT
  Filled 2013-06-05 (×5): qty 3

## 2013-06-05 MED ORDER — SODIUM CHLORIDE 0.9 % IJ SOLN
INTRAMUSCULAR | Status: AC
  Filled 2013-06-05: qty 10

## 2013-06-05 MED ORDER — TRAMADOL HCL 50 MG PO TABS: 50.0000 mg | ORAL_TABLET | Freq: Four times a day (QID) | ORAL | Status: DC | PRN

## 2013-06-05 MED ORDER — SODIUM CHLORIDE 0.9 % IR SOLN: Status: DC | PRN

## 2013-06-05 MED ORDER — DOCUSATE SODIUM 100 MG PO CAPS
100.0000 mg | ORAL_CAPSULE | Freq: Two times a day (BID) | ORAL | Status: DC
  Administered 2013-06-05 – 2013-06-09 (×8): 100 mg via ORAL

## 2013-06-05 MED ORDER — DIPHENHYDRAMINE HCL 12.5 MG/5ML PO ELIX: 12.5000 mg | ORAL_SOLUTION | ORAL | Status: DC | PRN

## 2013-06-05 MED ORDER — METOCLOPRAMIDE HCL 5 MG/ML IJ SOLN: 5.0000 mg | Freq: Three times a day (TID) | INTRAMUSCULAR | Status: DC | PRN

## 2013-06-05 MED ORDER — DEXAMETHASONE SODIUM PHOSPHATE 10 MG/ML IJ SOLN
10.0000 mg | Freq: Every day | INTRAMUSCULAR | Status: AC
  Filled 2013-06-05: qty 1

## 2013-06-05 MED ORDER — ACETAMINOPHEN 500 MG PO TABS
1000.0000 mg | ORAL_TABLET | Freq: Four times a day (QID) | ORAL | Status: AC
  Administered 2013-06-05 – 2013-06-06 (×4): 1000 mg via ORAL
  Filled 2013-06-05 (×4): qty 2

## 2013-06-05 MED ORDER — PANTOPRAZOLE SODIUM 40 MG PO TBEC
80.0000 mg | DELAYED_RELEASE_TABLET | Freq: Every day | ORAL | Status: DC
  Administered 2013-06-06 – 2013-06-09 (×4): 80 mg via ORAL
  Filled 2013-06-05 (×4): qty 2

## 2013-06-05 MED ORDER — ONDANSETRON HCL 4 MG/2ML IJ SOLN
4.0000 mg | Freq: Four times a day (QID) | INTRAMUSCULAR | Status: DC | PRN
  Administered 2013-06-05: 4 mg via INTRAVENOUS
  Filled 2013-06-05: qty 2

## 2013-06-05 MED ORDER — BUPIVACAINE HCL (PF) 0.75 % IJ SOLN
INTRAMUSCULAR | Status: DC
  Administered 2013-06-05: 12:00:00 via EPIDURAL
  Filled 2013-06-05 (×6): qty 20

## 2013-06-05 MED ORDER — PHENYLEPHRINE HCL 10 MG/ML IJ SOLN
10.0000 mg | INTRAVENOUS | Status: DC | PRN
  Administered 2013-06-05: 50 ug/min via INTRAVENOUS

## 2013-06-05 MED ORDER — CEFAZOLIN SODIUM-DEXTROSE 2-3 GM-% IV SOLR
2.0000 g | INTRAVENOUS | Status: AC
  Administered 2013-06-05: 2 g via INTRAVENOUS

## 2013-06-05 MED ORDER — FLUTICASONE PROPIONATE 50 MCG/ACT NA SUSP
1.0000 | Freq: Every day | NASAL | Status: DC | PRN
  Filled 2013-06-05: qty 16

## 2013-06-05 MED ORDER — LIDOCAINE-EPINEPHRINE (PF) 2 %-1:200000 IJ SOLN
INTRAMUSCULAR | Status: AC
  Filled 2013-06-05: qty 20

## 2013-06-05 MED ORDER — FENTANYL CITRATE 0.05 MG/ML IJ SOLN
INTRAMUSCULAR | Status: DC | PRN
Start: 2013-06-05 — End: 2013-06-05
  Administered 2013-06-05 (×2): 50 ug via INTRAVENOUS

## 2013-06-05 MED ORDER — ACETAMINOPHEN 10 MG/ML IV SOLN
1000.0000 mg | Freq: Once | INTRAVENOUS | Status: AC
  Administered 2013-06-05: 1000 mg via INTRAVENOUS
  Filled 2013-06-05: qty 100

## 2013-06-05 MED ORDER — PHENYLEPHRINE HCL 10 MG/ML IJ SOLN
INTRAMUSCULAR | Status: AC
  Filled 2013-06-05: qty 1

## 2013-06-05 MED ORDER — METHOCARBAMOL 500 MG PO TABS
500.0000 mg | ORAL_TABLET | Freq: Four times a day (QID) | ORAL | Status: DC | PRN
  Administered 2013-06-05 – 2013-06-09 (×11): 500 mg via ORAL
  Filled 2013-06-05 (×11): qty 1

## 2013-06-05 MED ORDER — EPHEDRINE SULFATE 50 MG/ML IJ SOLN
INTRAMUSCULAR | Status: AC
  Filled 2013-06-05: qty 1

## 2013-06-05 MED ORDER — LORATADINE 10 MG PO TABS
10.0000 mg | ORAL_TABLET | Freq: Every day | ORAL | Status: DC
  Administered 2013-06-05 – 2013-06-09 (×5): 10 mg via ORAL
  Filled 2013-06-05 (×5): qty 1

## 2013-06-05 MED ORDER — MENTHOL 3 MG MT LOZG
1.0000 | LOZENGE | OROMUCOSAL | Status: DC | PRN
  Filled 2013-06-05 (×2): qty 9

## 2013-06-05 MED ORDER — HYDROMORPHONE HCL PF 1 MG/ML IJ SOLN
INTRAMUSCULAR | Status: AC
  Filled 2013-06-05: qty 1

## 2013-06-05 MED ORDER — ONDANSETRON HCL 4 MG/2ML IJ SOLN
INTRAMUSCULAR | Status: DC | PRN
  Administered 2013-06-05: 4 mg via INTRAVENOUS

## 2013-06-05 MED ORDER — FLEET ENEMA 7-19 GM/118ML RE ENEM: 1.0000 | ENEMA | Freq: Once | RECTAL | Status: AC | PRN

## 2013-06-05 MED ORDER — SODIUM CHLORIDE 0.9 % IV SOLN: INTRAVENOUS | Status: DC

## 2013-06-05 MED ORDER — DEXAMETHASONE 6 MG PO TABS
10.0000 mg | ORAL_TABLET | Freq: Every day | ORAL | Status: AC
  Administered 2013-06-06: 10 mg via ORAL
  Filled 2013-06-05: qty 1

## 2013-06-05 MED ORDER — CEFAZOLIN SODIUM-DEXTROSE 2-3 GM-% IV SOLR
INTRAVENOUS | Status: AC
Start: 2013-06-05 — End: 2013-06-05
  Filled 2013-06-05: qty 50

## 2013-06-05 MED ORDER — DEXTROSE-NACL 5-0.9 % IV SOLN
INTRAVENOUS | Status: DC
  Administered 2013-06-05 – 2013-06-06 (×3): via INTRAVENOUS

## 2013-06-05 SURGICAL SUPPLY — 66 items
BAG SPEC THK2 15X12 ZIP CLS (MISCELLANEOUS) ×3
BAG ZIPLOCK 12X15 (MISCELLANEOUS) ×6 IMPLANT
BANDAGE ELASTIC 6 VELCRO ST LF (GAUZE/BANDAGES/DRESSINGS) ×5 IMPLANT
BANDAGE ESMARK 6X9 LF (GAUZE/BANDAGES/DRESSINGS) ×3 IMPLANT
BLADE HEX COATED 2.75 (ELECTRODE) ×1 IMPLANT
BLADE SAG 18X100X1.27 (BLADE) ×4 IMPLANT
BLADE SAW SGTL 11.0X1.19X90.0M (BLADE) ×4 IMPLANT
BLADE SURG SZ10 CARB STEEL (BLADE) ×4 IMPLANT
BNDG CMPR 9X6 STRL LF SNTH (GAUZE/BANDAGES/DRESSINGS) ×3
BNDG COHESIVE 6X5 TAN STRL LF (GAUZE/BANDAGES/DRESSINGS) ×2 IMPLANT
BNDG ESMARK 6X9 LF (GAUZE/BANDAGES/DRESSINGS) ×6
BOWL SMART MIX CTS (DISPOSABLE) ×6 IMPLANT
CAP KNEE ATTUNE RP ×2 IMPLANT
CEMENT HV SMART SET (Cement) ×10 IMPLANT
CUFF TOURN SGL QUICK 34 (TOURNIQUET CUFF) ×6
CUFF TRNQT CYL 34X4X40X1 (TOURNIQUET CUFF) ×3 IMPLANT
DECANTER SPIKE VIAL GLASS SM (MISCELLANEOUS) ×2 IMPLANT
DRAPE EXTREMITY BILATERAL (DRAPE) ×2 IMPLANT
DRAPE EXTREMITY T 121X128X90 (DRAPE) ×2 IMPLANT
DRAPE INCISE IOBAN 66X45 STRL (DRAPES) ×2 IMPLANT
DRAPE POUCH INSTRU U-SHP 10X18 (DRAPES) ×4 IMPLANT
DRAPE U-SHAPE 47X51 STRL (DRAPES) ×8 IMPLANT
DRSG ADAPTIC 3X8 NADH LF (GAUZE/BANDAGES/DRESSINGS) ×5 IMPLANT
DURAPREP 26ML APPLICATOR (WOUND CARE) ×6 IMPLANT
ELECT REM PT RETURN 9FT ADLT (ELECTROSURGICAL) ×4
ELECTRODE REM PT RTRN 9FT ADLT (ELECTROSURGICAL) ×2 IMPLANT
EVACUATOR 1/8 PVC DRAIN (DRAIN) ×6 IMPLANT
FACESHIELD WRAPAROUND (MASK) ×16 IMPLANT
FACESHIELD WRAPAROUND OR TEAM (MASK) ×13 IMPLANT
GLOVE BIO SURGEON STRL SZ7.5 (GLOVE) ×4 IMPLANT
GLOVE BIO SURGEON STRL SZ8 (GLOVE) ×6 IMPLANT
GLOVE BIOGEL PI IND STRL 6.5 (GLOVE) IMPLANT
GLOVE BIOGEL PI IND STRL 8 (GLOVE) ×3 IMPLANT
GLOVE BIOGEL PI INDICATOR 6.5 (GLOVE)
GLOVE BIOGEL PI INDICATOR 8 (GLOVE) ×3
GLOVE SURG SS PI 6.5 STRL IVOR (GLOVE) IMPLANT
GOWN STRL REUS W/TWL LRG LVL3 (GOWN DISPOSABLE) ×4 IMPLANT
GOWN STRL REUS W/TWL XL LVL3 (GOWN DISPOSABLE) ×2 IMPLANT
HANDPIECE INTERPULSE COAX TIP (DISPOSABLE) ×4
IMMOBILIZER KNEE 20 (SOFTGOODS) ×5 IMPLANT
KIT BASIN OR (CUSTOM PROCEDURE TRAY) ×4 IMPLANT
MANIFOLD NEPTUNE II (INSTRUMENTS) ×4 IMPLANT
NDL SAFETY ECLIPSE 18X1.5 (NEEDLE) ×2 IMPLANT
NEEDLE HYPO 18GX1.5 SHARP (NEEDLE) ×4
NS IRRIG 1000ML POUR BTL (IV SOLUTION) ×4 IMPLANT
PACK TOTAL JOINT (CUSTOM PROCEDURE TRAY) ×4 IMPLANT
PAD ABD 8X10 STRL (GAUZE/BANDAGES/DRESSINGS) ×2 IMPLANT
PADDING CAST COTTON 6X4 STRL (CAST SUPPLIES) ×12 IMPLANT
POSITIONER SURGICAL ARM (MISCELLANEOUS) ×2 IMPLANT
SET HNDPC FAN SPRY TIP SCT (DISPOSABLE) ×2 IMPLANT
SPONGE GAUZE 4X4 12PLY (GAUZE/BANDAGES/DRESSINGS) ×4 IMPLANT
SPONGE LAP 18X18 X RAY DECT (DISPOSABLE) ×3 IMPLANT
STOCKINETTE 8 INCH (MISCELLANEOUS) ×2 IMPLANT
STRIP CLOSURE SKIN 1/2X4 (GAUZE/BANDAGES/DRESSINGS) ×10 IMPLANT
SUCTION FRAZIER 12FR DISP (SUCTIONS) ×4 IMPLANT
SUT MNCRL AB 4-0 PS2 18 (SUTURE) ×6 IMPLANT
SUT VIC AB 2-0 CT1 27 (SUTURE) ×18
SUT VIC AB 2-0 CT1 TAPERPNT 27 (SUTURE) ×9 IMPLANT
SUT VLOC 180 0 24IN GS25 (SUTURE) ×6 IMPLANT
SYR 20CC LL (SYRINGE) ×2 IMPLANT
SYR 50ML LL SCALE MARK (SYRINGE) ×2 IMPLANT
TOWEL OR 17X26 10 PK STRL BLUE (TOWEL DISPOSABLE) ×6 IMPLANT
TOWEL OR NON WOVEN STRL DISP B (DISPOSABLE) IMPLANT
TRAY FOLEY CATH 14FRSI W/METER (CATHETERS) ×3 IMPLANT
WATER STERILE IRR 1500ML POUR (IV SOLUTION) ×6 IMPLANT
WRAP KNEE MAXI GEL POST OP (GAUZE/BANDAGES/DRESSINGS) ×6 IMPLANT

## 2013-06-05 NOTE — Op Note (Signed)
Pre-operative diagnosis- Osteoarthritis  Bilateral knee(s)  Post-operative diagnosis- Osteoarthritis Bilateral knee(s)  Procedure-  Bilateral  Total Knee Arthroplasty  Surgeon- Dione Plover. Marchele Decock, MD  Assistant- Arlee Muslim, PA-C   Anesthesia-  Epidural  EBL-* No blood loss amount entered *   Drains Hemovac x 1 each side  Tourniquet time-  Total Tourniquet Time Documented: Thigh (Right) - 35 minutes Total: Thigh (Right) - 35 minutes  Thigh (Left) - 34 minutes Total: Thigh (Left) - 34 minutes     Complications- None  Condition-PACU - hemodynamically stable.   Brief Clinical Note  Sharon Daniels is a 63 y.o. year old female with end stage OA of both knees with progressively worsening pain and dysfunction. She has constant pain, with activity and at rest and significant functional deficits with difficulties even with ADLs. She has had extensive non-op management including analgesics, injections of cortisone and viscosupplements, and home exercise program, but remains in significant pain with significant dysfunction. We discussed replacing both knees in the same setting versus one at a time including procedure, risks, potential complications, rehab course, and pros and cons associated with each and the patient elects to do both knees at the same time. She presents now for right Total Knee Arthroplasty.     Procedure in detail---   The patient is brought into the operating room and positioned supine on the operating table. After successful administration of  Epidural,   a tourniquet is placed high on the  Bilateral thigh(s) and the lower extremities are prepped and draped in the usual sterile fashion. Time out is performed by the operating team and then the  Left lower extremity is wrapped in Esmarch, knee flexed and the tourniquet inflated to 300 mmHg.       A midline incision is made with a ten blade through the subcutaneous tissue to the level of the extensor mechanism. A fresh  blade is used to make a medial parapatellar arthrotomy. Soft tissue over the proximal medial tibia is subperiosteally elevated to the joint line with a knife and into the semimembranosus bursa with a Cobb elevator. Soft tissue over the proximal lateral tibia is elevated with attention being paid to avoiding the patellar tendon on the tibial tubercle. The patella is everted, knee flexed 90 degrees and the ACL and PCL are removed. Findings are Bone on bone medial and patellofemoral with large medial and patellar osteophytes.        The drill is used to create a starting hole in the distal femur and the canal is thoroughly irrigated with sterile saline to remove the fatty contents. The 5 degree Left  valgus alignment guide is placed into the femoral canal and the distal femoral cutting block is pinned to remove 9 mm off the distal femur. Resection is made with an oscillating saw.      The tibia is subluxed forward and the menisci are removed. The extramedullary alignment guide is placed referencing proximally at the medial aspect of the tibial tubercle and distally along the second metatarsal axis and tibial crest. The block is pinned to remove 70mm off the more deficient medial  side. Resection is made with an oscillating saw. Size 5is the most appropriate size for the tibia and the proximal tibia is prepared with the modular drill and keel punch for that size.      The femoral sizing guide is placed and size 6 is most appropriate. Rotation is marked off the epicondylar axis and confirmed by creating a rectangular  flexion gap at 90 degrees. The size 6 cutting block is pinned in this rotation and the anterior, posterior and chamfer cuts are made with the oscillating saw. The intercondylar block is then placed and that cut is made.      Trial size 5 tibial component, trial size 6 posterior stabilized femur and a 7  mm posterior stabilized rotating platform insert trial is placed. Full extension is achieved with  excellent varus/valgus and anterior/posterior balance throughout full range of motion. The patella is everted and thickness measured to be 22  mm. Free hand resection is taken to 12 mm, a 38 template is placed, lug holes are drilled, trial patella is placed, and it tracks normally. Osteophytes are removed off the posterior femur with the trial in place. All trials are removed and the cut bone surfaces prepared with pulsatile lavage. Cement is mixed and once ready for implantation, the size 5 tibial implant, size  6 posterior stabilized femoral component, and the size 38 patella are cemented in place and the patella is held with the clamp. The trial insert is placed and the knee held in full extension.  All extruded cement is removed and once the cement is hard the permanent 7 mm posterior stabilized rotating platform insert is placed into the tibial tray.      The wound is copiously irrigated with saline solution and the extensor mechanism closed over a hemovac drain with #1 V-loc suture. The tourniquet is released for a total tourniquet time of 34  minutes. Flexion against gravity is 140 degrees and the patella tracks normally. Subcutaneous tissue is closed with 2.0 vicryl and subcuticular with running 4.0 Monocryl.       The  Right lower extremity is wrapped in Esmarch, knee flexed and the tourniquet inflated to 300 mmHg.       A midline incision is made with a ten blade through the subcutaneous tissue to the level of the extensor mechanism. A fresh blade is used to make a medial parapatellar arthrotomy. Soft tissue over the proximal medial tibia is subperiosteally elevated to the joint line with a knife and into the semimembranosus bursa with a Cobb elevator. Soft tissue over the proximal lateral tibia is elevated with attention being paid to avoiding the patellar tendon on the tibial tubercle. The patella is everted, knee flexed 90 degrees and the ACL and PCL are removed. Findings are  bone on bone medial and  patellofemoral with large medial and patellar osteophytes.       The drill is used to create a starting hole in the distal femur and the canal is thoroughly irrigated with sterile saline to remove the fatty contents. The 5 degree Right  valgus alignment guide is placed into the femoral canal and the distal femoral cutting block is pinned to remove 9 mm off the distal femur. Resection is made with an oscillating saw.      The tibia is subluxed forward and the menisci are removed. The extramedullary alignment guide is placed referencing proximally at the medial aspect of the tibial tubercle and distally along the second metatarsal axis and tibial crest. The block is pinned to remove 10mm off the more deficient medial  side. Resection is made with an oscillating saw. Size 5is the most appropriate size for the tibia and the proximal tibia is prepared with the modular drill and keel punch for that size.      The femoral sizing guide is placed and size 6 is most appropriate. Rotation  is marked off the epicondylar axis and confirmed by creating a rectangular flexion gap at 90 degrees. The size 6 cutting block is pinned in this rotation and the anterior, posterior and chamfer cuts are made with the oscillating saw. The intercondylar block is then placed and that cut is made.      Trial size 5 tibial component, trial size 6 posterior stabilized femur and a 7  mm posterior stabilized rotating platform insert trial is placed. Full extension is achieved with excellent varus/valgus and anterior/posterior balance throughout full range of motion. The patella is everted and thickness measured to be 22  mm. Free hand resection is taken to 12 mm, a 38 template is placed, lug holes are drilled, trial patella is placed, and it tracks normally. Osteophytes are removed off the posterior femur with the trial in place. All trials are removed and the cut bone surfaces prepared with pulsatile lavage. Cement is mixed and once ready for  implantation, the size 5 tibial implant, size  6 posterior stabilized femoral component, and the size 38 patella are cemented in place and the patella is held with the clamp. The trial insert is placed and the knee held in full extension.   All extruded cement is removed and once the cement is hard the permanent 7 mm posterior stabilized rotating platform insert is placed into the tibial tray.      The wound is copiously irrigated with saline solution and the extensor mechanism closed over a hemovac drain with #1 V-loc suture. The tourniquet is released for a total tourniquet time of 35  minutes. Flexion against gravity is 140 degrees and the patella tracks normally. Subcutaneous tissue is closed with 2.0 vicryl and subcuticular with running 4.0 Monocryl. The incisions are cleaned and dried and steri-strips and  bulky sterile dressings are applied. The limbs are placed into knee immobilizers and the patient is awakened and transported to recovery in stable condition.            Please note that a surgical assistant was a medical necessity for this procedure in order to perform it in a safe and expeditious manner. Surgical assistant was necessary to retract the ligaments and vital neurovascular structures to prevent injury to them and also necessary for proper positioning of the limb to allow for anatomic placement of the prosthesis.   Dione Plover Lucelia Lacey, MD    06/05/2013, 11:25 AM

## 2013-06-05 NOTE — Anesthesia Procedure Notes (Signed)
Epidural Patient location during procedure: holding area Start time: 06/05/2013 8:35 AM End time: 06/05/2013 8:45 AM  Staffing Anesthesiologist: Freddie Apley F Performed by: anesthesiologist   Preanesthetic Checklist Completed: patient identified, site marked, surgical consent, pre-op evaluation, timeout performed, IV checked, risks and benefits discussed, monitors and equipment checked and post-op pain management  Epidural Patient position: sitting Prep: Betadine Patient monitoring: heart rate, continuous pulse ox and blood pressure Approach: midline Location: L2-L3  Needle:  Needle type: Hustead  Needle gauge: 18 G Needle length: 9 cm and 9 Catheter type: closed end flexible Catheter size: 20 Guage Test dose: negative, 1.5% lidocaine and 2% lidocaine with Epi 1:200 K  Additional Notes Test dose 1.5% Lidocaine with epi 1:200,000  Patient tolerated the insertion well without complications.Reason for block:post-op pain management

## 2013-06-05 NOTE — Anesthesia Postprocedure Evaluation (Signed)
Anesthesia Post Note  Patient: Sharon Daniels  Procedure(s) Performed: Procedure(s) (LRB): BILATERAL TOTAL KNEE ARTHROPLASTY (Bilateral)  Anesthesia type: General  Patient location: PACU  Post pain: Pain level controlled  Post assessment: Post-op Vital signs reviewed  Last Vitals:  Filed Vitals:   06/05/13 1353  BP: 111/73  Pulse: 83  Temp: 36.6 C  Resp: 14    Post vital signs: Reviewed  Level of consciousness: sedated  Complications: No apparent anesthesia complications

## 2013-06-05 NOTE — Transfer of Care (Signed)
Immediate Anesthesia Transfer of Care Note  Patient: Sharon Daniels  Procedure(s) Performed: Procedure(s): BILATERAL TOTAL KNEE ARTHROPLASTY (Bilateral)  Patient Location: PACU  Anesthesia Type:Epidural  Level of Consciousness: sedated  Airway & Oxygen Therapy: Patient Spontanous Breathing and Patient connected to nasal cannula oxygen  Post-op Assessment: Report given to PACU RN and Post -op Vital signs reviewed and stable  Post vital signs: Reviewed and stable  Complications: No apparent anesthesia complications

## 2013-06-05 NOTE — H&P (View-Only) (Signed)
Sharon Daniels. Meine DOB: 1950/12/01 Married / Language: English / Race: White Female  Date of Admission:  06-05-2013  Chief Complaint:  Bilateral Knee Pain  History of Present Illness The patient is a 63 year old female who comes in for a preoperative History and Physical. The patient is scheduled for a bilateral total knee arthroplasty to be performed by Dr. Dione Plover. Aluisio, MD at [facility] on 06-05-2013. The patient is a 63 year old female who presents for follow up of their knee. The patient is being followed for their bilateral knee pain and osteoarthritis. They are now 7 year(s) out from the last visit. Symptoms reported today include: pain. The patient feels that they are doing poorly and report their pain level to be moderate. Current treatment includes: activity modification. The following medication has been used for pain control: antiinflammatory medication (Aleve). Note for "Follow-up Knee": She has been seeing her PCP over the years and has had a series of viscosupplementation, which helped some. It has been two years since her last injections with her PCP. She states that both knees hurt terribly at this time. They are limiting what she can and cannot do. She has had cortisone and viscosupplement injections with minimal benefit. She is at a stage where the knees are effectively taking over her life and she would like to do something to make herself more active again and to get a more pain-free lifestyle.  She is ready to proceed with surgery.  Risks and benefits of proceeding with one versus both of the knees has been discussed with the patient.and she would like to proceed with bilateral knee replacements. They have been treated conservatively in the past for the above stated problem and despite conservative measures, they continue to have progressive pain and severe functional limitations and dysfunction. They have failed non-operative management including home  exercise, medications, and injections. It is felt that they would benefit from undergoing total joint replacements. Risks and benefits of the procedure have been discussed with the patient and they elect to proceed with surgery. There are no active contraindications to surgery such as ongoing infection or rapidly progressive neurological disease.   Allergies Morphine Sulfate *ANALGESICS - OPIOID*. Itching.   Problem List/Past Medical Primary osteoarthritis of both knees (715.16  M17.0) Osteoarthritis Depression High blood pressure Kidney Stone Anemia Measles Bronchitis. Past History Pneumonia. Past History Alcoholism. Treatment 1992  Family History Chronic Obstructive Lung Disease. mother Depression. mother Drug / Alcohol Addiction. mother Cancer. mother Heart Disease. father Hypertension. mother Osteoporosis. mother   Social History Drug/Alcohol Rehab (Previously). yes Exercise. Exercises weekly; does gym / weights Illicit drug use. no Drug/Alcohol Rehab (Currently). no Alcohol use. current drinker; drinks beer, wine and hard liquor; 15 or more per week Children. 2 Current work status. working part time Tobacco use. former smoker; smoke(d) 1 pack(s) per day Living situation. live with spouse Marital status. married Pain Contract. no    Medication History Aleve (220MG  Tablet, Oral) Active. Valsartan (80MG  Tablet, Oral) Active. Montelukast Sodium (10MG  Tablet, Oral) Active. Claritin (10MG  Tablet, Oral) Active. Combivent Respimat (20-100MCG/ACT Aerosol Soln, Inhalation) Active. Estrace (0.1MG /GM Cream, Vaginal) Active. Primrose Oil Active. Flonase (50MCG/ACT Suspension, Nasal) Active. Magnesium Gluconate (250MG  Tablet, Oral) Active. Metamucil (0.52GM Capsule, Oral) Active. Multivital ( Oral) Active. Nystatin (Mouth-Throat) ( Mouth/Throat) Active. Omeprazole (40MG  Capsule DR, Oral) Active. Potassium (99MG  Tablet, Oral)  Active. Tumeric Active. Vitamin C (500MG  Tablet Chewable, Oral) Active. Vitamin D3 (5000UNIT Capsule, Oral) Active.   Past Surgical History Carpal  Tunnel Repair. right Cesarean Delivery. 2 times Sinus Surgery    Review of Systems General:Not Present- Chills, Fever, Night Sweats, Fatigue, Weight Gain, Weight Loss and Memory Loss. Skin:Not Present- Hives, Itching, Rash, Eczema and Lesions. HEENT:Not Present- Tinnitus, Headache, Double Vision, Visual Loss, Hearing Loss and Dentures. Respiratory:Not Present- Shortness of breath with exertion, Shortness of breath at rest, Allergies, Coughing up blood and Chronic Cough. Cardiovascular:Not Present- Chest Pain, Racing/skipping heartbeats, Difficulty Breathing Lying Down, Murmur, Swelling and Palpitations. Gastrointestinal:Not Present- Bloody Stool, Heartburn, Abdominal Pain, Vomiting, Nausea, Constipation, Diarrhea, Difficulty Swallowing, Jaundice and Loss of appetitie. Female Genitourinary:Not Present- Blood in Urine, Urinary frequency, Weak urinary stream, Discharge, Flank Pain, Incontinence, Painful Urination, Urgency, Urinary Retention and Urinating at Night. Musculoskeletal:Not Present- Muscle Weakness, Muscle Pain, Joint Swelling, Joint Pain, Back Pain, Morning Stiffness and Spasms. Neurological:Not Present- Tremor, Dizziness, Blackout spells, Paralysis, Difficulty with balance and Weakness. Psychiatric:Not Present- Insomnia.    Vitals Pulse: 84 (Regular) Resp.: 14 (Unlabored) BP: 122/78 (Sitting, Right Arm, Standard)   Physical Exam The physical exam findings are as follows:   General Mental Status - Alert, cooperative and good historian. General Appearance- pleasant. Not in acute distress. Orientation- Oriented X3. Build & Nutrition- Well nourished and Well developed.   Head and Neck Head- normocephalic, atraumatic . Neck Global Assessment- supple. no bruit auscultated on the right and  no bruit auscultated on the left.   Eye Vision- Wears corrective lenses. Pupil- Bilateral- Regular and Round. Motion- Bilateral- EOMI.   Chest and Lung Exam Auscultation: Breath sounds:- clear at anterior chest wall and - clear at posterior chest wall. Adventitious sounds:- No Adventitious sounds.   Cardiovascular Auscultation:Rhythm- Regular rate and rhythm. Heart Sounds- S1 WNL and S2 WNL. Murmurs & Other Heart Sounds:Auscultation of the heart reveals - No Murmurs.   Abdomen Inspection:Contour- Generalized mild distention. Palpation/Percussion:Tenderness- Abdomen is non-tender to palpation. Rigidity (guarding)- Abdomen is soft. Auscultation:Auscultation of the abdomen reveals - Bowel sounds normal.   Female Genitourinary Not done, not pertinent to present illness  Musculoskeletal On exam she is alert and oriented in no apparent distress. Her hips show normal range of motion with no discomfort. Both knees show no effusion.  Left knee range is about 5-125 degrees. Moderate crepitus on range of motion. Tenderness medial greater than lateral with no instability.  Right knee with no effusion. Range 5-120 degrees. Moderate crepitus on range of motion. Tenderness medial greater than lateral with no instability. Pulses, sensation, and motor intact in both lower extremities.  RADIOGRAPHS AP of both knees and lateral show severe end stage arthritic change in both knees, medial and patellofemoral with significant varus deformity and lateral subluxation of both tibias. The right side may be slightly worse than the left, but not a tremendous amount.   Assessment & Plan Primary osteoarthritis of both knees (715.16  M17.0)  Note: Plan is for a Bilateral Total Knee Replacements by Dr. Wynelle Link.  Plan is to go to CIR.  PCP - Dr. Ernie Hew - Patient has been seen preoperatively and felt to be stable for surgery.  The patient does not have any contraindications and  will receive TXA (tranexamic acid) prior to surgery.  Signed electronically by Joelene Millin, III PA-C

## 2013-06-05 NOTE — Interval H&P Note (Signed)
History and Physical Interval Note:  06/05/2013 8:28 AM  Sharon Daniels  has presented today for surgery, with the diagnosis of OA BILATERAL KNEE  The various methods of treatment have been discussed with the patient and family. After consideration of risks, benefits and other options for treatment, the patient has consented to  Procedure(s): BILATERAL TOTAL KNEE ARTHROPLASTY (Bilateral) as a surgical intervention .  The patient's history has been reviewed, patient examined, no change in status, stable for surgery.  I have reviewed the patient's chart and labs.  Questions were answered to the patient's satisfaction.     Pilar Plate Caragh Gasper V

## 2013-06-05 NOTE — Anesthesia Preprocedure Evaluation (Signed)
Anesthesia Evaluation  Patient identified by MRN, date of birth, ID band Patient awake    Reviewed: Allergy & Precautions, H&P , NPO status , Patient's Chart, lab work & pertinent test results  Airway Mallampati: II TM Distance: >3 FB Neck ROM: Full    Dental  (+) Teeth Intact, Dental Advisory Given   Pulmonary neg pulmonary ROS, former smoker,  + rhonchi   Pulmonary exam normal       Cardiovascular hypertension, Rhythm:Regular Rate:Normal     Neuro/Psych Depression negative neurological ROS  negative psych ROS   GI/Hepatic negative GI ROS, Neg liver ROS,   Endo/Other  negative endocrine ROS  Renal/GU negative Renal ROS  negative genitourinary   Musculoskeletal negative musculoskeletal ROS (+)   Abdominal   Peds negative pediatric ROS (+)  Hematology negative hematology ROS (+)   Anesthesia Other Findings   Reproductive/Obstetrics negative OB ROS                           Anesthesia Physical Anesthesia Plan  ASA: II  Anesthesia Plan: Epidural   Post-op Pain Management:    Induction: Intravenous  Airway Management Planned: Simple Face Mask  Additional Equipment:   Intra-op Plan:   Post-operative Plan:   Informed Consent: I have reviewed the patients History and Physical, chart, labs and discussed the procedure including the risks, benefits and alternatives for the proposed anesthesia with the patient or authorized representative who has indicated his/her understanding and acceptance.   Dental advisory given  Plan Discussed with: CRNA  Anesthesia Plan Comments:         Anesthesia Quick Evaluation

## 2013-06-06 LAB — CBC
HCT: 24.4 % — ABNORMAL LOW (ref 36.0–46.0)
Hemoglobin: 7.9 g/dL — ABNORMAL LOW (ref 12.0–15.0)
MCH: 30.9 pg (ref 26.0–34.0)
MCHC: 32.4 g/dL (ref 30.0–36.0)
MCV: 95.3 fL (ref 78.0–100.0)
Platelets: 229 10*3/uL (ref 150–400)
RBC: 2.56 MIL/uL — ABNORMAL LOW (ref 3.87–5.11)
RDW: 13.9 % (ref 11.5–15.5)
WBC: 10.3 10*3/uL (ref 4.0–10.5)

## 2013-06-06 LAB — BASIC METABOLIC PANEL
BUN: 15 mg/dL (ref 6–23)
CHLORIDE: 103 meq/L (ref 96–112)
CO2: 28 mEq/L (ref 19–32)
Calcium: 8.5 mg/dL (ref 8.4–10.5)
Creatinine, Ser: 0.66 mg/dL (ref 0.50–1.10)
Glucose, Bld: 154 mg/dL — ABNORMAL HIGH (ref 70–99)
POTASSIUM: 4.6 meq/L (ref 3.7–5.3)
SODIUM: 139 meq/L (ref 137–147)

## 2013-06-06 LAB — PROTIME-INR
INR: 1.04 (ref 0.00–1.49)
Prothrombin Time: 13.4 seconds (ref 11.6–15.2)

## 2013-06-06 MED ORDER — SODIUM CHLORIDE 0.9 % IV SOLN
INTRAVENOUS | Status: DC
  Administered 2013-06-06 – 2013-06-07 (×3): via EPIDURAL
  Filled 2013-06-06 (×10): qty 20

## 2013-06-06 MED ORDER — WARFARIN - PHARMACIST DOSING INPATIENT: Freq: Every day | Status: DC

## 2013-06-06 MED ORDER — COUMADIN BOOK
1.0000 | Freq: Once | Status: AC
  Administered 2013-06-06: 1
  Filled 2013-06-06: qty 1

## 2013-06-06 MED ORDER — WARFARIN VIDEO
Freq: Once | Status: AC
  Administered 2013-06-07: 12:00:00

## 2013-06-06 MED ORDER — WARFARIN SODIUM 4 MG PO TABS
4.0000 mg | ORAL_TABLET | Freq: Once | ORAL | Status: AC
  Administered 2013-06-06: 4 mg via ORAL
  Filled 2013-06-06: qty 1

## 2013-06-06 NOTE — Progress Notes (Signed)
ANTICOAGULATION CONSULT NOTE - Initial Consult  Pharmacy Consult for warfarin Indication: VTE prophylaxis  Allergies  Allergen Reactions  . Morphine Itching    REACTION: rash, IV    Patient Measurements: Height: 5\' 8"  (172.7 cm) Weight: 210 lb (95.255 kg) IBW/kg (Calculated) : 63.9 Heparin Dosing Weight:   Vital Signs: Temp: 98.5 F (36.9 C) (05/30 0113) Temp src: Oral (05/30 0113) BP: 105/72 mmHg (05/30 0113) Pulse Rate: 84 (05/30 0113)  Labs:  Recent Labs  06/06/13 0450  HGB 7.9*  HCT 24.4*  PLT 229  LABPROT 13.4  INR 1.04  CREATININE 0.66    Estimated Creatinine Clearance: 89.2 ml/min (by C-G formula based on Cr of 0.66).   Medical History: Past Medical History  Diagnosis Date  . Allergy   . Arthritis   . Substance abuse   . Anemia   . Hypertension   . H/O bronchitis   . Pneumonia     hx of 10 years ago  . Depression     hx of  . History of kidney stones     x3  . Complication of anesthesia     "during breast surgery: could hear the surgery, wake up too soon, anxious    Medications:  Scheduled:  . acetaminophen  1,000 mg Oral 4 times per day  . dexamethasone  10 mg Oral Daily   Or  . dexamethasone  10 mg Intravenous Daily  . docusate sodium  100 mg Oral BID  . ipratropium-albuterol  3 mL Nebulization Daily  . irbesartan  75 mg Oral Daily  . loratadine  10 mg Oral Daily  . montelukast  10 mg Oral Daily  . nystatin  5 mL Oral Daily  . nystatin   Topical BID  . pantoprazole  80 mg Oral Daily   Infusions:  . bupivacaine (MARCAINE, SENSORCAINE) epidural    . dextrose 5 % and 0.9% NaCl 75 mL/hr at 06/06/13 0239  . lactated ringers      Assessment: Patient post ortho surgery with Epidural.    Goal of Therapy:  INR 2-3    Plan:  Start with Coumadin 4 mg tonight. Check PT/INR daily. Provide Coumadin education. Follow up with removal of epidural.   Cyndia Diver. 06/06/2013,5:52 AM

## 2013-06-06 NOTE — Progress Notes (Signed)
Patient having mild discomfort both Knees and shins. Good motor function of foot dorsiflexors and plantar flexors. Epidural infusion running at 49ml per hour. Scheduled to start PT today. Doing well. Will leave epidural catheter in place and d/c epidural infusion in the am.

## 2013-06-06 NOTE — Progress Notes (Signed)
   Subjective: 1 Day Post-Op Procedure(s) (LRB): BILATERAL TOTAL KNEE ARTHROPLASTY (Bilateral) Patient reports pain as mild.  Pain is below both knees. Epidural working well We will start therapy today.  Plan is to go Rehab after hospital stay.  Objective: Vital signs in last 24 hours: Temp:  [97.9 F (36.6 C)-99.1 F (37.3 C)] 99.1 F (37.3 C) (05/30 0646) Pulse Rate:  [69-88] 74 (05/30 0646) Resp:  [14-18] 17 (05/30 0646) BP: (98-115)/(54-73) 102/69 mmHg (05/30 0646) SpO2:  [93 %-100 %] 95 % (05/30 0646) Weight:  [210 lb (95.255 kg)] 210 lb (95.255 kg) (05/29 1340)  Intake/Output from previous day:  Intake/Output Summary (Last 24 hours) at 06/06/13 0722 Last data filed at 06/06/13 0647  Gross per 24 hour  Intake 3756.85 ml  Output   2770 ml  Net 986.85 ml    Intake/Output this shift:    Labs:  Recent Labs  06/06/13 0450  HGB 7.9*    Recent Labs  06/06/13 0450  WBC 10.3  RBC 2.56*  HCT 24.4*  PLT 229    Recent Labs  06/06/13 0450  NA 139  K 4.6  CL 103  CO2 28  BUN 15  CREATININE 0.66  GLUCOSE 154*  CALCIUM 8.5    Recent Labs  06/06/13 0450  INR 1.04    EXAM General - Patient is Alert, Appropriate and Oriented Extremity - Neurologically intact Neurovascular intact No cellulitis present Compartment soft Dressing - dressing C/D/I Motor Function - intact, moving foot and toes well on exam.  Hemovac pulled without difficulty.  Past Medical History  Diagnosis Date  . Allergy   . Arthritis   . Substance abuse   . Anemia   . Hypertension   . H/O bronchitis   . Pneumonia     hx of 10 years ago  . Depression     hx of  . History of kidney stones     x3  . Complication of anesthesia     "during breast surgery: could hear the surgery, wake up too soon, anxious    Assessment/Plan: 1 Day Post-Op Procedure(s) (LRB): BILATERAL TOTAL KNEE ARTHROPLASTY (Bilateral) Principal Problem:   OA (osteoarthritis) of knee   Advance diet Up  with therapy Hopeful transfer to rehab next week  DVT Prophylaxis - Coumadin. Begin Lovenox 30 mg SubQ bid tomorrow 12 hours after epidural is pulled Weight-Bearing as tolerated to bilateral leg Keep foley until tomorrow. D/C Foley tomorrow 6 hours after epidural is pulled   [employer] 06/06/2013, 7:22 AM

## 2013-06-06 NOTE — Progress Notes (Signed)
CARE MANAGEMENT NOTE 06/06/2013  Patient:  Sharon Daniels, Sharon Daniels   Account Number:  [id number]  Date Initiated:  06/06/2013  Documentation initiated by:  [facility]  Subjective/Objective Assessment:   BILATERAL TOTAL KNEE ARTHROPLASTY     Action/Plan:   CIR vs HH   Anticipated DC Date:  06/07/2013   Anticipated DC Plan:  [place]  CM consult      Choice offered to / List presented to:             Status of service:  Completed, signed off Medicare Important Message given?   (If response is "NO", the following Medicare IM given date fields will be blank) Date Medicare IM given:   Date Additional Medicare IM given:    Discharge Disposition:  IP REHAB FACILITY  Per UR Regulation:    If discussed at Long Length of Stay Meetings, dates discussed:    Comments:  06/06/2013 1200 NCM spoke to pt and states she wants to go to IP rehab. Jonnie Finner RN CCM Case Mgmt phone [phone]

## 2013-06-06 NOTE — Evaluation (Signed)
Occupational Therapy Evaluation Patient Details Name: Sharon Daniels MRN: [id number] DOB: [birth year] Today's Date: 06/06/2013    History of Present Illness Bil TKAs   Clinical Impression   This 63 yo female admitted and underwent above presents to acute OT with decreased mobility, decreased bend in Bil LEs, increased pain, all affecting pt's ability to care for herself. She will benefit from acute OT with follow up OT on CIR to get back to her PLOF of Independent or Mod I.    Follow Up Recommendations  SNF    Equipment Recommendations   (TBD next venue)       Precautions / Restrictions Precautions Precautions: Fall;Knee Required Braces or Orthoses: Knee Immobilizer - Right;Knee Immobilizer - Left Knee Immobilizer - Right:  (has Epidural in ) Knee Immobilizer - Left:  (has epidural in) Restrictions Weight Bearing Restrictions: No      Mobility Bed Mobility Overal bed mobility: Needs Assistance;+2 for physical assistance Bed Mobility: Supine to Sit     Supine to sit: Mod assist;+2 for physical assistance;HOB elevated     General bed mobility comments: VCs for sequence and to hold legs until she got forward enough on EOB to put feet on floor  Transfers Overall transfer level: Needs assistance Equipment used: Rolling walker (2 wheeled) Transfers: Sit to/from Stand Sit to Stand: Mod assist;+2 physical assistance         General transfer comment: VCs for safe hand placement    Balance Overall balance assessment: Needs assistance Sitting-balance support: Feet supported;No upper extremity supported Sitting balance-[name]: Fair     Standing balance support: Bilateral upper extremity supported Standing balance-[name]: Poor                              ADL Overall ADL's : Needs assistance/impaired Eating/Feeding: Set up;Sitting   Grooming: Set up;Sitting   Upper Body Bathing: Set up;Sitting   Lower Body Bathing: Total assistance  (with +2 physical A sit<>stand (mod))   Upper Body Dressing : Set up;Sitting   Lower Body Dressing: Total assistance (with +2 physical A sit<>stand (mod))   Toilet Transfer: Moderate assistance;+2 for physical assistance;Ambulation;RW (Bil KIs, bed (raised>recliner))   Toileting- Clothing Manipulation and Hygiene: Total assistance (with +2 physical A (mod) for sit<>stand)       Functional mobility during ADLs: +2 for physical assistance;Moderate assistance                 Pertinent Vitals/Pain Increased pain with supine to sit and stand to sit; relieved once in these positions for 1-2 minutes, ice applied as well at end of session; pre-medicated        Extremity/Trunk Assessment Upper Extremity Assessment Upper Extremity Assessment: Overall WFL for tasks assessed   Lower Extremity Assessment Lower Extremity Assessment: Defer to PT evaluation          Cognition Arousal/Alertness: Awake/alert Behavior During Therapy: WFL for tasks assessed/performed Overall Cognitive Status: Within Functional Limits for tasks assessed                                Home Living Family/patient expects to be discharged to:: Inpatient rehab Living Arrangements: Spouse/significant other  OT Diagnosis: Generalized weakness;Acute pain   OT Problem List: Decreased range of motion;Impaired balance (sitting and/or standing);Pain;Obesity;Decreased knowledge of use of DME or AE   OT Treatment/Interventions: Self-care/ADL training;Patient/family education;Balance training;DME and/or AE instruction    OT Goals(Current goals can be found in the care plan section) Acute Rehab OT Goals Patient Stated Goal: to go to rehab then home OT Goal Formulation: With patient Time For Goal Achievement: 06/13/13 Potential to Achieve Goals: Good  OT Frequency: Min 2X/week           Co-evaluation PT/OT/SLP Co-Evaluation/Treatment:  Yes (partial) Reason for Co-Treatment: Complexity of the patient's impairments (multi-system involvement);For patient/therapist safety   OT goals addressed during session: ADL's and self-care;Strengthening/ROM      End of Session Equipment Utilized During Treatment: Gait belt;Rolling walker Nurse Communication:  (Pt up in chair, call PT if pt ready to get back to bed before PT returns)  Activity Tolerance: Patient tolerated treatment well Patient left: in chair;with call bell/phone within reach;with family/visitor present   Time: 3953-2023 OT Time Calculation (min): 21 min Charges:  OT General Charges $OT Visit: 1 Procedure OT Evaluation $Initial OT Evaluation Tier I: 1 Procedure OT Treatments $Self Care/Home Management : 8-22 mins  Clariza Sickman [phone] 06/06/2013, 11:47 AM

## 2013-06-06 NOTE — Addendum Note (Signed)
Addendum created 06/06/13 7425 by Venia Carbon. Royce Macadamia, MD   Modules edited: Notes Section   Notes Section:  File: [id number]

## 2013-06-06 NOTE — Progress Notes (Signed)
Physical Therapy Treatment Patient Details Name: Sharon Daniels MRN: [id number] DOB: [birth year] Today's Date: 06/06/2013    History of Present Illness Bil TKAs    PT Comments      Follow Up Recommendations  CIR     Equipment Recommendations  None recommended by PT    Recommendations for Other Services OT consult     Precautions / Restrictions Precautions Precautions: Fall;Knee Required Braces or Orthoses: Knee Immobilizer - Right;Knee Immobilizer - Left Knee Immobilizer - Right: Discontinue once straight leg raise with < 10 degree lag Knee Immobilizer - Left: Discontinue once straight leg raise with < 10 degree lag Restrictions Weight Bearing Restrictions: No Other Position/Activity Restrictions: wbat    Mobility  Bed Mobility Overal bed mobility: Needs Assistance;+2 for physical assistance Bed Mobility: Sit to Supine     Supine to sit: Mod assist;+2 for physical assistance;HOB elevated Sit to supine: Mod assist;+2 for physical assistance   General bed mobility comments: cues for sequence with physical assist for Bil LE management and to control trunk for descent to supine  Transfers Overall transfer level: Needs assistance Equipment used: Rolling walker (2 wheeled) Transfers: Sit to/from Stand Sit to Stand: Mod assist;+2 physical assistance         General transfer comment: VCs for safe hand placement and to walk LEs fwd/back   Ambulation/Gait Ambulation/Gait assistance: Mod assist;+2 physical assistance Ambulation Distance (Feet): 4 Feet Assistive device: Rolling walker (2 wheeled) Gait Pattern/deviations: Step-to pattern;Decreased step length - right;Decreased step length - left;Shuffle;Trunk flexed Gait velocity: decr   General Gait Details: cues for posture, sequence and position from [employer] Mobility    Modified Rankin (Stroke Patients Only)       Balance Overall balance assessment: Needs  assistance Sitting-balance support: Feet supported;No upper extremity supported Sitting balance-[name]: Fair     Standing balance support: Bilateral upper extremity supported Standing balance-[name]: Poor                      Cognition Arousal/Alertness: Awake/alert Behavior During Therapy: WFL for tasks assessed/performed Overall Cognitive Status: Within Functional Limits for tasks assessed                      Exercises Total Joint Exercises Ankle Circles/Pumps: AROM;Both;15 reps;Supine Quad Sets: AROM;Both;10 reps;Supine;AAROM Heel Slides: AAROM;Both;10 reps;Supine Straight Leg Raises: AAROM;Both;10 reps;Supine    General Comments        Pertinent Vitals/Pain 4/10; premed, ice packs provided, BP witting 89/52; supine 108/64    Home Living Family/patient expects to be discharged to:: Inpatient rehab Living Arrangements: Spouse/significant other                  Prior Function Level of Independence: Independent with assistive device(s)      Comments: used cane or rollator   PT Goals (current goals can now be found in the care plan section) Acute Rehab PT Goals Patient Stated Goal: to go to rehab then home PT Goal Formulation: With patient Time For Goal Achievement: 06/13/13 Potential to Achieve Goals: Good Progress towards PT goals: Progressing toward goals    Frequency  7X/week    PT Plan Current plan remains appropriate    Co-evaluation   Reason for Co-Treatment: Complexity of the patient's impairments (multi-system involvement);For patient/therapist safety   OT goals addressed during session: ADL's and self-care;Strengthening/ROM     End of Session Equipment  Utilized During Treatment: Gait belt;Right knee immobilizer;Left knee immobilizer Activity Tolerance: Patient tolerated treatment well;Other (comment) (decreased BP at rest 89/52; elevated to 108/64 on return to ) Patient left: in bed;with call bell/phone within  reach     Time: 1322-1345 PT Time Calculation (min): 23 min  Charges:  $Gait Training: 8-22 mins $Therapeutic Exercise: 8-22 mins $Therapeutic Activity: 8-22 mins                    G Codes:      Sharon Daniels 2013/06/11, 2:31 PM

## 2013-06-06 NOTE — Evaluation (Signed)
Physical Therapy Evaluation Patient Details Name: Sharon Daniels MRN: [id number] DOB: 08-28-1950 Today's Date: 06/06/2013   History of Present Illness  Bil TKAs  Clinical Impression  Pt s/p Bil TKRs presents with decreased Bil LE strength/ROM and post op pain limiting functional mobility.  Pt will benefit from follow up rehab at CIR level to maximize IND and safety prior to return home with ltd assist    Follow Up Recommendations CIR    Equipment Recommendations  None recommended by PT    Recommendations for Other Services OT consult     Precautions / Restrictions Precautions Precautions: Fall;Knee Required Braces or Orthoses: Knee Immobilizer - Right;Knee Immobilizer - Left Knee Immobilizer - Right:  (has Epidural in ) Knee Immobilizer - Left:  (has epidural in) Restrictions Weight Bearing Restrictions: No Other Position/Activity Restrictions: wbat      Mobility  Bed Mobility Overal bed mobility: Needs Assistance;+2 for physical assistance Bed Mobility: Supine to Sit     Supine to sit: Mod assist;+2 for physical assistance;HOB elevated     General bed mobility comments: VCs for sequence and to hold legs until she got forward enough on EOB to put feet on floor  Transfers Overall transfer level: Needs assistance Equipment used: Rolling walker (2 wheeled) Transfers: Sit to/from Stand Sit to Stand: Mod assist;+2 physical assistance         General transfer comment: VCs for safe hand placement  Ambulation/Gait Ambulation/Gait assistance: +2 physical assistance;Mod assist Ambulation Distance (Feet): 4 Feet Assistive device: Rolling walker (2 wheeled) Gait Pattern/deviations: Step-to pattern;Decreased step length - right;Decreased step length - left;Shuffle;Trunk flexed Gait velocity: decr   General Gait Details: cues for posture, sequence and position from [employer] Mobility    Modified Rankin (Stroke Patients Only)        Balance Overall balance assessment: Needs assistance Sitting-balance support: Feet supported;No upper extremity supported Sitting balance-[name]: Fair     Standing balance support: Bilateral upper extremity supported Standing balance-[name]: Poor                               Pertinent Vitals/Pain 6/10; premed, epidural in place, cold packs provided    Home Living Family/patient expects to be discharged to:: Inpatient rehab Living Arrangements: Spouse/significant other                    Prior Function Level of Independence: Independent with assistive device(s)         Comments: used cane or rollator     Hand Dominance   Dominant Hand: Left    Extremity/Trunk Assessment   Upper Extremity Assessment: Overall WFL for tasks assessed           Lower Extremity Assessment: Defer to PT evaluation RLE Deficits / Details: quads 2/5 with AAROM at knee -10 - 65 LLE Deficits / Details: 2/5 quads with AAROM at knee -10 - 65  Cervical / Trunk Assessment: Normal  Communication   Communication: No difficulties  Cognition Arousal/Alertness: Awake/alert Behavior During Therapy: WFL for tasks assessed/performed Overall Cognitive Status: Within Functional Limits for tasks assessed                      General Comments      Exercises Total Joint Exercises Ankle Circles/Pumps: AROM;Both;15 reps;Supine Quad Sets: AROM;Both;10 reps;Supine;AAROM Heel Slides: AAROM;Both;10 reps;Supine Straight Leg  Raises: AAROM;Both;10 reps;Supine      Assessment/Plan    PT Assessment Patient needs continued PT services  PT Diagnosis Difficulty walking   PT Problem List Decreased strength;Decreased range of motion;Decreased activity tolerance;Decreased mobility;Decreased knowledge of use of DME;Obesity;Pain  PT Treatment Interventions DME instruction;Gait training;Functional mobility training;Therapeutic activities;Therapeutic  exercise;Patient/family education   PT Goals (Current goals can be found in the Care Plan section) Acute Rehab PT Goals Patient Stated Goal: to go to rehab then home PT Goal Formulation: With patient Time For Goal Achievement: 06/13/13 Potential to Achieve Goals: Good    Frequency 7X/week   Barriers to discharge        Co-evaluation   Reason for Co-Treatment: Complexity of the patient's impairments (multi-system involvement);For patient/therapist safety   OT goals addressed during session: ADL's and self-care;Strengthening/ROM       End of Session Equipment Utilized During Treatment: Gait belt;Right knee immobilizer;Left knee immobilizer Activity Tolerance: Patient tolerated treatment well Patient left: in chair;with call bell/phone within reach;with family/visitor present Nurse Communication: Mobility status         Time: 9379-0240 PT Time Calculation (min): 46 min   Charges:   PT Evaluation $Initial PT Evaluation Tier I: 1 Procedure PT Treatments $Gait Training: 8-22 mins $Therapeutic Exercise: 8-22 mins   PT G Codes:          [name] 06/06/2013, 1:02 PM

## 2013-06-07 LAB — CBC
HCT: 21.9 % — ABNORMAL LOW (ref 36.0–46.0)
Hemoglobin: 7.1 g/dL — ABNORMAL LOW (ref 12.0–15.0)
MCH: 31.1 pg (ref 26.0–34.0)
MCHC: 32.4 g/dL (ref 30.0–36.0)
MCV: 96.1 fL (ref 78.0–100.0)
Platelets: 190 10*3/uL (ref 150–400)
RBC: 2.28 MIL/uL — ABNORMAL LOW (ref 3.87–5.11)
RDW: 14.4 % (ref 11.5–15.5)
WBC: 12.5 10*3/uL — AB (ref 4.0–10.5)

## 2013-06-07 LAB — BASIC METABOLIC PANEL
BUN: 16 mg/dL (ref 6–23)
CO2: 27 mEq/L (ref 19–32)
CREATININE: 0.66 mg/dL (ref 0.50–1.10)
Calcium: 8.5 mg/dL (ref 8.4–10.5)
Chloride: 104 mEq/L (ref 96–112)
GFR calc non Af Amer: >90 mL/min (ref >90)
Glucose, Bld: 124 mg/dL — ABNORMAL HIGH (ref 70–99)
POTASSIUM: 4.2 meq/L (ref 3.7–5.3)
SODIUM: 141 meq/L (ref 137–147)

## 2013-06-07 LAB — PREPARE RBC (CROSSMATCH)

## 2013-06-07 LAB — PROTIME-INR
INR: 1.03 (ref 0.00–1.49)
PROTHROMBIN TIME: 13.3 s (ref 11.6–15.2)

## 2013-06-07 MED ORDER — WARFARIN SODIUM 7.5 MG PO TABS
7.5000 mg | ORAL_TABLET | Freq: Once | ORAL | Status: AC
  Administered 2013-06-07: 7.5 mg via ORAL
  Filled 2013-06-07: qty 1

## 2013-06-07 MED ORDER — ENOXAPARIN SODIUM 30 MG/0.3ML ~~LOC~~ SOLN
30.0000 mg | Freq: Two times a day (BID) | SUBCUTANEOUS | Status: DC
  Administered 2013-06-07 – 2013-06-09 (×4): 30 mg via SUBCUTANEOUS
  Filled 2013-06-07 (×6): qty 0.3

## 2013-06-07 MED ORDER — ALUM & MAG HYDROXIDE-SIMETH 200-200-20 MG/5ML PO SUSP
30.0000 mL | Freq: Three times a day (TID) | ORAL | Status: DC | PRN
  Administered 2013-06-07: 30 mL via ORAL
  Filled 2013-06-07: qty 30

## 2013-06-07 MED ORDER — DIPHENHYDRAMINE HCL 25 MG PO CAPS
25.0000 mg | ORAL_CAPSULE | Freq: Once | ORAL | Status: AC
  Administered 2013-06-07: 25 mg via ORAL
  Filled 2013-06-07: qty 1

## 2013-06-07 MED ORDER — ALUM HYDROXIDE-MAG TRISILICATE 80-20 MG PO CHEW
1.0000 | CHEWABLE_TABLET | Freq: Four times a day (QID) | ORAL | Status: DC | PRN
  Filled 2013-06-07: qty 2

## 2013-06-07 NOTE — Progress Notes (Signed)
Epidural discontinued and removed intact. Good postoperative analgesia. Sterile bandage applied to insertion site. No complications noted.

## 2013-06-07 NOTE — Progress Notes (Signed)
Subjective: 2 Days Post-Op Procedure(s) (LRB): BILATERAL TOTAL KNEE ARTHROPLASTY (Bilateral) Patient reports pain as well controlled. Soreness to bilateral knees. Tolerating PO's. Tolerating post-op care well. Denies HA, CP, SOB, or calf pain. No N/V/F/C. Seen by anesthesia this am and epidural was d/'ed without complication.   Objective: Vital signs in last 24 hours: Temp:  [98.2 F (36.8 C)-99.2 F (37.3 C)] 98.6 F (37 C) (05/31 0524) Pulse Rate:  [83-97] 97 (05/31 0524) Resp:  [14-18] 16 (05/31 0524) BP: (89-153)/(58-84) 135/74 mmHg (05/31 0524) SpO2:  [94 %-98 %] 95 % (05/31 0817)  Intake/Output from previous day: 05/30 0701 - 05/31 0700 In: 2121 [P.O.:480; I.V.:1641] Out: 2375 [Urine:2375] Intake/Output this shift: Total I/O In: 240 [P.O.:240] Out: 200 [Urine:200]   Recent Labs  06/06/13 0450 06/07/13 0538  HGB 7.9* 7.1*    Recent Labs  06/06/13 0450 06/07/13 0538  WBC 10.3 12.5*  RBC 2.56* 2.28*  HCT 24.4* 21.9*  PLT 229 190    Recent Labs  06/06/13 0450 06/07/13 0538  NA 139 141  K 4.6 4.2  CL 103 104  CO2 28 27  BUN 15 16  CREATININE 0.66 0.66  GLUCOSE 154* 124*  CALCIUM 8.5 8.5    Recent Labs  06/06/13 0450 06/07/13 0538  INR 1.04 1.03    Alert and oriented x3. RRR, Lungs clear, BS x4. Left Calf soft and non tender. L knee dressing Changed. No DVT signs. No signs of infection or compartment syndrome. LLE neurovascularly intact.  Right Calf soft and non tender. Right knee dressing Changed. No DVT signs. Compartment soft. No signs of infection.  Right LE neurovascular intact.  Assessment/Plan: 2 Days Post-Op Procedure(s) (LRB): BILATERAL TOTAL KNEE ARTHROPLASTY (Bilateral) Work with PT Dressing changed Lovenox start at 2030, on coumadin D/C foley later today Epidural D/c'ed Continue other current care  Acute post-op anemia: Transfuse 2 units PRBC's today   Langley Flatley L Dorothyann Mourer 06/07/2013, 8:55 AM

## 2013-06-07 NOTE — Addendum Note (Signed)
Addendum created 06/07/13 7356 by Ayesha Mohair, MD   Modules edited: Clinical Notes   Clinical Notes:  File: [id number]

## 2013-06-07 NOTE — Progress Notes (Signed)
ANTICOAGULATION CONSULT NOTE - Follow Up Consult  Pharmacy Consult for Warfarin Indication: VTE prophylaxis  Allergies  Allergen Reactions  . Morphine Itching    REACTION: rash, IV    Patient Measurements: Height: 5\' 8"  (172.7 cm) Weight: 210 lb (95.255 kg) IBW/kg (Calculated) : 63.9  Vital Signs: Temp: 98.6 F (37 C) (05/31 0524) Temp src: Oral (05/31 0524) BP: 135/74 mmHg (05/31 0524) Pulse Rate: 97 (05/31 0524)  Labs:  Recent Labs  06/06/13 0450 06/07/13 0538  HGB 7.9* 7.1*  HCT 24.4* 21.9*  PLT 229 190  LABPROT 13.4 13.3  INR 1.04 1.03  CREATININE 0.66 0.66    Estimated Creatinine Clearance: 89.2 ml/min (by C-G formula based on Cr of 0.66).   Medications:  Scheduled:  . docusate sodium  100 mg Oral BID  . ipratropium-albuterol  3 mL Nebulization Daily  . irbesartan  75 mg Oral Daily  . loratadine  10 mg Oral Daily  . montelukast  10 mg Oral Daily  . nystatin  5 mL Oral Daily  . nystatin   Topical BID  . pantoprazole  80 mg Oral Daily  . warfarin   Does not apply Once  . Warfarin - Pharmacist Dosing Inpatient   Does not apply q1800   Infusions:  . bupivacaine (MARCAINE, SENSORCAINE) epidural 14 mL/hr at 06/07/13 0606  . dextrose 5 % and 0.9% NaCl 20 mL/hr at 06/07/13 0600    Assessment: 66 yoF admitted 5/29 for bilateral total knee arthroplasty.  Post-op she has epidural in place.  Pharmacy is consulted to dose warfarin for VTE prophylaxis.  Noted plan for Lovenox once epidural is removed (planning on removal 5/31).    INR 1.03, subtherapeutic as expected.  Low dose warfarin given 5/30 while epidural still in place  CBC: Hgb decreased to 7.1, Plt 190.  Planning transfusion of 2 units PRBC per MD.  No bleeding or complications reported in chart.  No drug-drug interactions noted.  Epidural removed 5/31 at 0830 by anesthesiology.  Intact, no complications.   Goal of Therapy:  INR 2-3 Monitor platelets by anticoagulation protocol: Yes   Plan:    Warfarin 7.5mg  PO today at 1800  Begin Lovenox 30mg  q12h tonight at 2030 (12 hrs after epidural removal)  Daily INR while inpatient  Warfarin education prior to discharge.   Gretta Arab PharmD, BCPS Pager [phone] 06/07/2013 9:06 AM

## 2013-06-07 NOTE — Progress Notes (Signed)
Vitals took off PCA pump. Pt in no distress at this time. Sats 95% on 2plm Experiment, hr  92,rr 15.

## 2013-06-07 NOTE — Progress Notes (Signed)
Physical Therapy Treatment Patient Details Name: Sharon Daniels MRN: [id number] DOB: 02-07-1950 Today's Date: 06/07/2013    History of Present Illness Bil TKAs    PT Comments    Pt in increased pain today. Pt did briefly stand . Recommend CIR.  Follow Up Recommendations  CIR     Equipment Recommendations  None recommended by PT    Recommendations for Other Services       Precautions / Restrictions Precautions Precautions: Fall;Knee Required Braces or Orthoses: Knee Immobilizer - Right;Knee Immobilizer - Left Knee Immobilizer - Right: Discontinue once straight leg raise with < 10 degree lag Knee Immobilizer - Left: Discontinue once straight leg raise with < 10 degree lag    Mobility  Bed Mobility Overal bed mobility: Needs Assistance;+2 for physical assistance Bed Mobility: Sit to Supine     Supine to sit: Mod assist;+2 for physical assistance;HOB elevated Sit to supine: +2 for physical assistance;Mod assist;Max assist   General bed mobility comments: cues for sequence with physical assist for Bil LE management and to control trunk for descent to supine  Transfers Overall transfer level: Needs assistance Equipment used: Rolling walker (2 wheeled) Transfers: Sit to/from Stand Sit to Stand: Max assist;+2 physical assistance;+2 safety/equipment;From elevated surface         General transfer comment: VCs for safe hand placement and to walk LEs fwd/back , increased pain  Ambulation/Gait                 Stairs            Wheelchair Mobility    Modified Rankin (Stroke Patients Only)       Balance                                    Cognition Arousal/Alertness: Awake/alert Behavior During Therapy: Anxious (tearful)                        Exercises Total Joint Exercises Straight Leg Raises: AAROM;Both;10 reps;Supine    General Comments        Pertinent Vitals/Pain 8-9, RN notified.    Home Living                       Prior Function            PT Goals (current goals can now be found in the care plan section) Progress towards PT goals: Progressing toward goals    Frequency  7X/week    PT Plan Current plan remains appropriate    Co-evaluation             End of Session   Activity Tolerance: Patient limited by pain Patient left: in bed;with call bell/phone within reach     Time: 1411-1451 PT Time Calculation (min): 40 min  Charges:  $Therapeutic Exercise: 8-22 mins $Therapeutic Activity: 23-37 mins                    G Codes:      Claretha Cooper 06/07/2013, 4:28 PM

## 2013-06-08 DIAGNOSIS — Z96659 Presence of unspecified artificial knee joint: Secondary | ICD-10-CM

## 2013-06-08 DIAGNOSIS — M1710 Unilateral primary osteoarthritis, unspecified knee: Secondary | ICD-10-CM

## 2013-06-08 LAB — TYPE AND SCREEN
ABO/RH(D): A POS
Antibody Screen: NEGATIVE
Unit division: 0
Unit division: 0

## 2013-06-08 LAB — CBC
HCT: 27.6 % — ABNORMAL LOW (ref 36.0–46.0)
Hemoglobin: 9.2 g/dL — ABNORMAL LOW (ref 12.0–15.0)
MCH: 30.5 pg (ref 26.0–34.0)
MCHC: 33.3 g/dL (ref 30.0–36.0)
MCV: 91.4 fL (ref 78.0–100.0)
PLATELETS: 188 10*3/uL (ref 150–400)
RBC: 3.02 MIL/uL — ABNORMAL LOW (ref 3.87–5.11)
RDW: 16.4 % — AB (ref 11.5–15.5)
WBC: 11.4 10*3/uL — ABNORMAL HIGH (ref 4.0–10.5)

## 2013-06-08 LAB — PROTIME-INR
INR: 1.37 (ref 0.00–1.49)
Prothrombin Time: 16.5 seconds — ABNORMAL HIGH (ref 11.6–15.2)

## 2013-06-08 MED ORDER — WARFARIN SODIUM 7.5 MG PO TABS
7.5000 mg | ORAL_TABLET | Freq: Once | ORAL | Status: AC
  Administered 2013-06-08: 7.5 mg via ORAL
  Filled 2013-06-08: qty 1

## 2013-06-08 NOTE — Progress Notes (Signed)
ANTICOAGULATION CONSULT NOTE - Follow Up Consult  Pharmacy Consult for Warfarin Indication: VTE prophylaxis  Allergies  Allergen Reactions  . Morphine Itching    REACTION: rash, IV    Patient Measurements: Height: 5\' 8"  (172.7 cm) Weight: 210 lb (95.255 kg) IBW/kg (Calculated) : 63.9  Vital Signs: Temp: 99.3 F (37.4 C) (06/01 0500) Temp src: Oral (06/01 0500) BP: 131/74 mmHg (06/01 0500) Pulse Rate: 105 (06/01 0500)  Labs:  Recent Labs  06/06/13 0450 06/07/13 0538 06/08/13 0533  HGB 7.9* 7.1* 9.2*  HCT 24.4* 21.9* 27.6*  PLT 229 190 188  LABPROT 13.4 13.3 16.5*  INR 1.04 1.03 1.37  CREATININE 0.66 0.66  --     Estimated Creatinine Clearance: 89.2 ml/min (by C-G formula based on Cr of 0.66).   Medications:  Scheduled:  . docusate sodium  100 mg Oral BID  . enoxaparin (LOVENOX) injection  30 mg Subcutaneous Q12H  . ipratropium-albuterol  3 mL Nebulization Daily  . irbesartan  75 mg Oral Daily  . loratadine  10 mg Oral Daily  . montelukast  10 mg Oral Daily  . nystatin  5 mL Oral Daily  . nystatin   Topical BID  . pantoprazole  80 mg Oral Daily  . Warfarin - Pharmacist Dosing Inpatient   Does not apply q1800   Infusions:  . bupivacaine (MARCAINE, SENSORCAINE) epidural 14 mL/hr at 06/07/13 0606  . dextrose 5 % and 0.9% NaCl Stopped (06/07/13 1500)   Inpatient warfarin doses administered 5/30-5/31: 4 mg [capped dose per protocol due to presence of epidural catheter], 7.5 mg.  Goal of Therapy:  INR 2-3 Monitor platelets by anticoagulation protocol: Yes   Assessment: 45 yoF s/p bilateral total knee arthroplasty 5/29.  Post-op she had epidural infusion until AM of 5/31 (epidural catheter removed by anesthesiologist at  [phone] 5/31). Pharmacy was consulted to dose warfarin for VTE prophylaxis beginning POD#1 (5/30).  Prophylactic-dose LMWH was started by ortho on 5/31 twelve hours after removal of the epidural catheter.  6/1 POD#3  INR slowly responding  after two doses of warfarin (first dose was low by protocol due to presence of epidural catheter).  Remains on Lovenox 30 mg SQ q12h per ortho  SCr yesterday WNL and stable.  Current Lovenox dosage remains appropriate while CrCl >= 30 mL/min  Hgb improved after 2 units PRBC 5/31. Pltc WNL  No overt bleeding reported..  No significant drug-drug interactions noted.    Plan:   Warfarin 7.5mg  PO today at 1800  Continue prophylactic-dose Lovenox as ordered by ortho.  Daily INR while inpatient  Clayburn Pert, PharmD, BCPS Pager: [phone] 06/08/2013  8:52 AM

## 2013-06-08 NOTE — Progress Notes (Signed)
Pt is hoping to have rehab at [facility] following hospital d/c. Consult is pending. CSW will assist with SNF placement if CIR is unable to accept pt.  Werner Lean LCSW [phone]

## 2013-06-08 NOTE — Progress Notes (Signed)
   Subjective: 3 Days Post-Op Procedure(s) (LRB): BILATERAL TOTAL KNEE ARTHROPLASTY (Bilateral) Patient reports pain as mild.  Doing much better this AM Plan is to go Rehab after hospital stay.  Objective: Vital signs in last 24 hours: Temp:  [98 F (36.7 C)-99.5 F (37.5 C)] 99.3 F (37.4 C) (06/01 0500) Pulse Rate:  [73-105] 105 (06/01 0500) Resp:  [16-20] 20 (06/01 0500) BP: (111-146)/(72-84) 131/74 mmHg (06/01 0500) SpO2:  [94 %-99 %] 95 % (06/01 0500)  Intake/Output from previous day:  Intake/Output Summary (Last 24 hours) at 06/08/13 0636 Last data filed at 06/08/13 0500  Gross per 24 hour  Intake 1716.25 ml  Output   1825 ml  Net -108.75 ml    Intake/Output this shift: Total I/O In: 300 [P.O.:120; I.V.:180] Out: 850 [Urine:850]  Labs:  Recent Labs  06/06/13 0450 06/07/13 0538 06/08/13 0533  HGB 7.9* 7.1* 9.2*    Recent Labs  06/07/13 0538 06/08/13 0533  WBC 12.5* 11.4*  RBC 2.28* 3.02*  HCT 21.9* 27.6*  PLT 190 188    Recent Labs  06/06/13 0450 06/07/13 0538  NA 139 141  K 4.6 4.2  CL 103 104  CO2 28 27  BUN 15 16  CREATININE 0.66 0.66  GLUCOSE 154* 124*  CALCIUM 8.5 8.5    Recent Labs  06/07/13 0538 06/08/13 0533  INR 1.03 1.37    EXAM General - Patient is Alert, Appropriate and Oriented Extremity - Neurologically intact Neurovascular intact No cellulitis present Compartment soft Dressing/Incision - clean, dry, no drainage Motor Function - intact, moving foot and toes well on exam.   Past Medical History  Diagnosis Date  . Allergy   . Arthritis   . Substance abuse   . Anemia   . Hypertension   . H/O bronchitis   . Pneumonia     hx of 10 years ago  . Depression     hx of  . History of kidney stones     x3  . Complication of anesthesia     "during breast surgery: could hear the surgery, wake up too soon, anxious    Assessment/Plan: 3 Days Post-Op Procedure(s) (LRB): BILATERAL TOTAL KNEE ARTHROPLASTY  (Bilateral) Principal Problem:   OA (osteoarthritis) of knee   Up with therapy Await consult from inpatient rehab  DVT Prophylaxis - Lovenox and Coumadin Weight-Bearing as tolerated to bilateral legs  Gaynelle Arabian V 06/08/2013, 6:36 AM

## 2013-06-08 NOTE — Progress Notes (Signed)
Physical Therapy Treatment Patient Details Name: Sharon Daniels MRN: [id number] DOB: 1950/07/05 Today's Date: 06/08/2013    History of Present Illness Bil TKAs    PT Comments    Pt able to perform active SLR on  L LE but not on R so applied KI to R knee only.  Assisted OOB to BSC+ 2 assist then amb limited distance due to c/o feeling "woozy".  Positioned on recliner chair and performed some TKR TE's.  Follow Up Recommendations  CIR     Equipment Recommendations       Recommendations for Other Services       Precautions / Restrictions Precautions Precautions: Fall Required Braces or Orthoses: Knee Immobilizer - Right Knee Immobilizer - Right: Discontinue once straight leg raise with < 10 degree lag Knee Immobilizer - Left:  (pt able to perform SLR L LE so D/C KI for this session) Restrictions Weight Bearing Restrictions: No Other Position/Activity Restrictions: wbat    Mobility  Bed Mobility Overal bed mobility: Needs Assistance;+2 for physical assistance Bed Mobility: Supine to Sit     Supine to sit: +2 for physical assistance;Min assist     General bed mobility comments: 25% VC's onproper tech and min assist to guide B LE off bed until EOB with B feet on floor.  Pt required increased time.  Transfers Overall transfer level: Needs assistance Equipment used: Rolling walker (2 wheeled) Transfers: Sit to/from Stand Sit to Stand: Max assist;+2 physical assistance         General transfer comment: 25% VCs for safe hand placement and to walk LEs fwd/back , increased pain.  Asssited from elevated bed to [facility] then off BSC to amb.    Ambulation/Gait Ambulation/Gait assistance: +2 physical assistance;Min assist Ambulation Distance (Feet): 9 Feet Assistive device: Rolling walker (2 wheeled) Gait Pattern/deviations: Step-to pattern;Decreased step length - right;Decreased step length - left;Wide base of support Gait velocity: decr   General Gait Details: 25% VC's on  proper walker to self placement and breathing to decrease c/o dizziness.  Limited amb distance and tolerance as pt was c/o feeling "woozy".  Recliner brought to her from behind.   Stairs            Wheelchair Mobility    Modified Rankin (Stroke Patients Only)       Balance                                    Cognition Arousal/Alertness: Awake/alert Behavior During Therapy: WFL for tasks assessed/performed Overall Cognitive Status: Within Functional Limits for tasks assessed                      Exercises   10 reps B AP, knee presses and towel squeezes.     General Comments        Pertinent Vitals/Pain C/o 5/10 B Knee pain Pre medicated    Home Living                      Prior Function            PT Goals (current goals can now be found in the care plan section) Acute Rehab PT Goals Patient Stated Goal: to go to rehab then home Progress towards PT goals: Progressing toward goals    Frequency       PT Plan      Co-evaluation  End of Session Equipment Utilized During Treatment: Gait belt;Right knee immobilizer Activity Tolerance: Patient limited by fatigue Patient left: in chair;with call bell/phone within reach     Time: 1205-1245 PT Time Calculation (min): 40 min  Charges:  $Gait Training: 8-22 mins $Therapeutic Exercise: 8-22 mins $Therapeutic Activity: 8-22 mins                    G Codes:      Rica Koyanagi  PTA [facility]  Acute  Rehab Pager      [phone]

## 2013-06-08 NOTE — Progress Notes (Signed)
RT assessed PT- PT will remain on Duoneb QD in place of Combivent Respimat at home- while here in hospital.

## 2013-06-08 NOTE — Progress Notes (Signed)
Physical Therapy Treatment Patient Details Name: Sharon Daniels MRN: [id number] DOB: [birth year] Today's Date: 06/08/2013    History of Present Illness Bil TKAs    PT Comments    PM session.  Assisted pt out of recliner + 2 assit to [facility] then amb a short distance to bed.  Pt progressing and plans to D/C to CIR.  Follow Up Recommendations  CIR     Equipment Recommendations       Recommendations for Other Services       Precautions / Restrictions Precautions Precautions: Fall Required Braces or Orthoses: Knee Immobilizer - Right Knee Immobilizer - Right: Discontinue once straight leg raise with < 10 degree lag Knee Immobilizer - Left:  (pt able to perform SLR L LE so D/C KI for this session) Restrictions Weight Bearing Restrictions: No Other Position/Activity Restrictions: wbat    Mobility  Bed Mobility Overal bed mobility: Needs Assistance;+2 for physical assistance Bed Mobility: Sit to Supine     Supine to sit: +2 for physical assistance;Min assist Sit to supine: +2 for physical assistance;Mod assist;Max assist   General bed mobility comments: 25% VC's onproper tech and mod assist to guide B LE on bed.  Pt required increased time.  Transfers Overall transfer level: Needs assistance Equipment used: Rolling walker (2 wheeled) Transfers: Sit to/from Stand Sit to Stand: Max assist;+2 physical assistance         General transfer comment: 25% VCs for safe hand placement and to walk LEs fwd/back , increased pain.  Asssited from recliner to [facility] then off BSC to amb a short distance to bed.  Ambulation/Gait Ambulation/Gait assistance: +2 physical assistance;Min assist Ambulation Distance (Feet): 7 Feet Assistive device: Rolling walker (2 wheeled) Gait Pattern/deviations: Step-to pattern;Decreased step length - right;Decreased step length - left;Wide base of support Gait velocity: decr   General Gait Details: 25% VC's on proper walker to self placement and  breathing to decrease c/o dizziness.  Limited amb distance and tolerance as pt was c/o fatigue.  Also noted increased anxiety esp during stand to sit.    Stairs            Wheelchair Mobility    Modified Rankin (Stroke Patients Only)       Balance                                    Cognition Arousal/Alertness: Awake/alert Behavior During Therapy: WFL for tasks assessed/performed Overall Cognitive Status: Within Functional Limits for tasks assessed                      Exercises      General Comments        Pertinent Vitals/Pain     Home Living                      Prior Function            PT Goals (current goals can now be found in the care plan section) Acute Rehab PT Goals Patient Stated Goal: to go to rehab then home Progress towards PT goals: Progressing toward goals    Frequency       PT Plan      Co-evaluation             End of Session Equipment Utilized During Treatment: Gait belt;Right knee immobilizer Activity Tolerance: Patient limited by fatigue Patient left: in  bed;with call bell/phone within reach     Time: 1400-1425 PT Time Calculation (min): 25 min  Charges:  $Gait Training: 8-22 mins $Therapeutic Exercise: 8-22 mins $Therapeutic Activity: 8-22 mins                    G Codes:      Rica Koyanagi  PTA [facility]  Acute  Rehab Pager      [phone]

## 2013-06-08 NOTE — Consult Note (Signed)
Physical Medicine and Rehabilitation Consult  Reason for Consult: DJD bilateral knees Referring Physician: Dr. Wynelle Link   HPI: Sharon Daniels is a 63 y.o. 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 Patient elected to undergo B-TKR on 06/05/13 By Dr. Wynelle Link. Post op WBAT and on lovenox/coumadin bridge for DVT prophylaxis. Epidural cath d/c yesterday and patient has some some increase in pain with activity. ABLA with hgb 7.1 treated with units PRBC.  PT ongoing and CIR recommended by PT and MD.    Review of Systems  Constitutional: Positive for malaise/fatigue. Negative for fever.  Eyes: Negative for blurred vision and double vision.  Respiratory: Negative for cough and hemoptysis.   Cardiovascular: Negative for chest pain and palpitations.  Musculoskeletal: Positive for joint pain.  All other systems reviewed and are negative.   Past Medical History  Diagnosis Date  . Allergy   . Arthritis   . Substance abuse   . Anemia   . Hypertension   . H/O bronchitis   . Pneumonia     hx of 10 years ago  . Depression     hx of  . History of kidney stones     x3  . Complication of anesthesia     "during breast surgery: could hear the surgery, wake up too soon, anxious   Past Surgical History  Procedure Laterality Date  . Cosmetic surgery  2013    in the office  . Cesarean section  1989, 1992  . Breast surgery  1985    abscess  . Nasal sinus surgery  2000  . Total knee arthroplasty Bilateral 06/05/2013    Procedure: BILATERAL TOTAL KNEE ARTHROPLASTY;  Surgeon: Gearlean Alf, MD;  Location: [facility];  Service: Orthopedics;  Laterality: Bilateral;   Family History  Problem Relation Age of Onset  . Kidney disease Mother   . Atrial fibrillation Mother   . Heart attack Father    Social History:   Married. Independent PTA. Works part time as a Education officer, museum for [employer]. Per reports that she quit smoking about 17  months ago. Her smoking use included Cigarettes. She smoked 0.00 packs per day for 30 years. She has never used smokeless tobacco. She has couple of alcoholic drinks daily. Per reports that she does not illicit drugs.   Allergies  Allergen Reactions  . Morphine Itching    REACTION: rash, IV   Medications Prior to Admission  Medication Sig Dispense Refill  . Cholecalciferol (VITAMIN D-3) 5000 UNITS TABS Take 5,000 Units by mouth daily.      Marland Kitchen EVENING PRIMROSE OIL PO Take 1 tablet by mouth once a week. Thursdays      . fluticasone (FLONASE) 50 MCG/ACT nasal spray Place 1 spray into both nostrils daily as needed for allergies or rhinitis.      . Ipratropium-Albuterol (COMBIVENT RESPIMAT) 20-100 MCG/ACT AERS respimat Inhale 1 puff into the lungs daily.      Marland Kitchen loratadine (CLARITIN) 10 MG tablet Take 10 mg by mouth daily.      . montelukast (SINGULAIR) 10 MG tablet Take 10 mg by mouth daily.      . Naproxen Sodium (ALEVE) 220 MG CAPS Take 440 mg by mouth 2 (two) times daily as needed (Pain).      . nystatin (MYCOSTATIN) 100000 UNIT/ML suspension Take 5 mLs by mouth daily.      Marland Kitchen nystatin (MYCOSTATIN/NYSTOP) 100000 UNIT/GM POWD Apply  topically 2 (two) times daily. Under breasts      . omeprazole (PRILOSEC) 40 MG capsule Take 40 mg by mouth daily.      Marland Kitchen POTASSIUM PO Take 1 tablet by mouth daily.      . psyllium (HYDROCIL/METAMUCIL) 95 % PACK Take 1 packet by mouth daily.      . TURMERIC PO Take 1 tablet by mouth daily.      . valsartan (DIOVAN) 80 MG tablet Take 80 mg by mouth every morning.       . diclofenac sodium (VOLTAREN) 1 % GEL Apply 1 application topically daily as needed (Knees).      Marland Kitchen estradiol (ESTRACE) 0.1 MG/GM vaginal cream Place 6.96 Applicatorfuls vaginally daily.       Marland Kitchen MAGNESIUM PO Take 1 tablet by mouth daily.      . Multiple Vitamin (MULTIVITAMIN WITH MINERALS) TABS tablet Take 1 tablet by mouth daily.      Marland Kitchen Propylene Glycol (SYSTANE BALANCE) 0.6 % SOLN Apply 1 drop to  eye as needed (Dry eyes).      . vitamin C (ASCORBIC ACID) 500 MG tablet Take 500 mg by mouth daily.        Home: Home Living Family/patient expects to be discharged to:: Inpatient rehab Living Arrangements: Spouse/significant other  Functional History: Prior Function Level of Independence: Independent with assistive device(s) Comments: used cane or rollator Functional Status:  Mobility: Bed Mobility Overal bed mobility: Needs Assistance;+2 for physical assistance Bed Mobility: Sit to Supine Supine to sit: Mod assist;+2 for physical assistance;HOB elevated Sit to supine: +2 for physical assistance;Mod assist;Max assist General bed mobility comments: cues for sequence with physical assist for Bil LE management and to control trunk for descent to supine Transfers Overall transfer level: Needs assistance Equipment used: Rolling walker (2 wheeled) Transfers: Sit to/from Stand Sit to Stand: Max assist;+2 physical assistance;+2 safety/equipment;From elevated surface General transfer comment: VCs for safe hand placement and to walk LEs fwd/back , increased pain Ambulation/Gait Ambulation/Gait assistance: Mod assist;+2 physical assistance Ambulation Distance (Feet): 4 Feet Assistive device: Rolling walker (2 wheeled) Gait Pattern/deviations: Step-to pattern;Decreased step length - right;Decreased step length - left;Shuffle;Trunk flexed Gait velocity: decr General Gait Details: cues for posture, sequence and position from RW    ADL: ADL Overall ADL's : Needs assistance/impaired Eating/Feeding: Set up;Sitting Grooming: Set up;Sitting Upper Body Bathing: Set up;Sitting Lower Body Bathing: Total assistance (with +2 physical A sit<>stand (mod)) Upper Body Dressing : Set up;Sitting Lower Body Dressing: Total assistance (with +2 physical A sit<>stand (mod)) Toilet Transfer: Moderate assistance;+2 for physical assistance;Ambulation;RW (Bil KIs, bed (raised>recliner)) Toileting- Clothing  Manipulation and Hygiene: Total assistance (with +2 physical A (mod) for sit<>stand) Functional mobility during ADLs: +2 for physical assistance;Moderate assistance  Cognition: Cognition Overall Cognitive Status: Within Functional Limits for tasks assessed Orientation Level: Oriented X4 Cognition Arousal/Alertness: Awake/alert Behavior During Therapy: Anxious (tearful) Overall Cognitive Status: Within Functional Limits for tasks assessed  Blood pressure 131/74, pulse 105, temperature 99.3 F (37.4 C), temperature source Oral, resp. rate 20, height 5\' 8"  (1.727 m), weight 95.255 kg (210 lb), SpO2 95.00%. Physical Exam  Nursing note and vitals reviewed. Constitutional: She appears well-developed.  HENT:  Head: Normocephalic and atraumatic.  Eyes: Conjunctivae are normal. Pupils are equal, round, and reactive to light.  Neck: No JVD present. No tracheal deviation present. No thyromegaly present.  Cardiovascular: Normal rate.   Respiratory: No respiratory distress.  GI: She exhibits no distension.  Musculoskeletal:  Expected edema around either knee.  Both knees tender to basic ROM/palpation.   Neurological:  UE's 5/5. KE and HF limited due to knee pain/surgery. Normal ADF/APF bilaterally. No sensory changes.   Psychiatric: She has a normal mood and affect. Her behavior is normal. Thought content normal.    Results for orders placed during the hospital encounter of 06/05/13 (from the past 24 hour(s))  PREPARE RBC (CROSSMATCH)     Status: None   Collection Time    06/07/13  9:30 AM      Result Value Ref Range   Order Confirmation ORDER PROCESSED BY BLOOD BANK    CBC     Status: Abnormal   Collection Time    06/08/13  5:33 AM      Result Value Ref Range   WBC 11.4 (*) 4.0 - 10.5 K/uL   RBC 3.02 (*) 3.87 - 5.11 MIL/uL   Hemoglobin 9.2 (*) 12.0 - 15.0 g/dL   HCT 27.6 (*) 36.0 - 46.0 %   MCV 91.4  78.0 - 100.0 fL   MCH 30.5  26.0 - 34.0 pg   MCHC 33.3  30.0 - 36.0 g/dL   RDW  16.4 (*) 11.5 - 15.5 %   Platelets 188  150 - 400 K/uL  PROTIME-INR     Status: Abnormal   Collection Time    06/08/13  5:33 AM      Result Value Ref Range   Prothrombin Time 16.5 (*) 11.6 - 15.2 seconds   INR 1.37  0.00 - 1.49   No results found.  Assessment/Plan: Diagnosis: OA of bilateral knees.  1. Does the need for close, 24 hr/day medical supervision in concert with the patient's rehab needs make it unreasonable for this patient to be served in a less intensive setting? Yes 2. Co-Morbidities requiring supervision/potential complications: pain, ABLA 3. Due to bladder management, bowel management, safety, skin/wound care, disease management, medication administration, pain management and patient education, does the patient require 24 hr/day rehab nursing? Yes 4. Does the patient require coordinated care of a physician, rehab nurse, PT (1-2 hrs/day, 5 days/week) and OT (1-2 hrs/day, 5 days/week) to address physical and functional deficits in the context of the above medical diagnosis(es)? Yes Addressing deficits in the following areas: balance, endurance, locomotion, strength, transferring, bowel/bladder control, bathing, dressing, feeding, grooming, toileting and psychosocial support 5. Can the patient actively participate in an intensive therapy program of at least 3 hrs of therapy per day at least 5 days per week? Yes 6. The potential for patient to make measurable gains while on inpatient rehab is excellent 7. Anticipated functional outcomes upon discharge from inpatient rehab are modified independent  with PT, modified independent with OT, n/a with SLP. 8. Estimated rehab length of stay to reach the above functional goals is: 7-10 days 9. Does the patient have adequate social supports to accommodate these discharge functional goals? Yes 10. Anticipated D/C setting: Home 11. Anticipated post D/C treatments: HH therapy and Outpatient therapy 12. Overall Rehab/Functional Prognosis:  excellent  RECOMMENDATIONS: This patient's condition is appropriate for continued rehabilitative care in the following setting: CIR Patient has agreed to participate in recommended program. Yes Note that insurance prior authorization may be required for reimbursement for recommended care.  Comment: Rehab Admissions Coordinator to follow up.  Thanks,  Meredith Staggers, MD, Mellody Drown     06/08/2013

## 2013-06-08 NOTE — Progress Notes (Signed)
Occupational Therapy Treatment Patient Details Name: MICHEL ESKELSON MRN: [id number] DOB: 1950-02-26 Today's Date: 06/08/2013          Follow Up Recommendations  CIR          Precautions / Restrictions Precautions Precautions: Fall Required Braces or Orthoses: Knee Immobilizer - Right;Knee Immobilizer - Left Knee Immobilizer - Right: Discontinue once straight leg raise with < 10 degree lag Knee Immobilizer - Left: Discontinue once straight leg raise with < 10 degree lag              ADL Overall ADL's : Needs assistance/impaired                     Lower Body Dressing: With adaptive equipment;Cueing for safety;Maximal assistance;Sitting/lateral leans                 General ADL Comments: Pt sitting in a chair. OT initiated AE teaching.  Pt instructed and use AE- reacher and sock aid.  Explained where to obtain AE. Pt verbalized understanding.                  Cognition   Behavior During Therapy: WFL for tasks assessed/performed Overall Cognitive Status: Within Functional Limits for tasks assessed                       Extremity/Trunk Assessment                        Frequency Min 2X/week     Progress Toward Goals  OT Goals(current goals can now be found in the care plan section)  Progress towards OT goals: Progressing toward goals  Acute Rehab OT Goals Patient Stated Goal: to go to rehab then home  Plan Discharge plan remains appropriate    Co-evaluation                 End of Session     Activity Tolerance Patient tolerated treatment well   Patient Left in chair;with call bell/phone within reach;with family/visitor present   Nurse Communication          Time: 1337-1350 OT Time Calculation (min): 13 min  Charges: OT General Charges $OT Visit: 1 Procedure OT Treatments $Self Care/Home Management : 8-22 mins  Betsy Pries 06/08/2013, 2:15 PM

## 2013-06-08 NOTE — Progress Notes (Signed)
Inpatient Rehabilitation  Note that order has been written for CIR consult.  Will await consult completion.  Please call if questions.     [place] Admissions Coordinator Cell [phone] Office [phone]

## 2013-06-08 NOTE — Progress Notes (Signed)
Inpatient Rehabilitation  Dr. Naaman Plummer has completed CIR consult and is recommending inpatient rehab.  I am following the case and plan to see the patient in the am as well as contact the insurance company.   Please call if questions.  [place] Admissions Coordinator Cell [phone] Office [phone]

## 2013-06-09 ENCOUNTER — Encounter (HOSPITAL_COMMUNITY): Payer: Self-pay | Admitting: *Deleted

## 2013-06-09 ENCOUNTER — Inpatient Hospital Stay (HOSPITAL_COMMUNITY)
Admission: RE | Admit: 2013-06-09 | Discharge: 2013-06-17 | DRG: 945 | Disposition: A | Discharge status: Home Health | Payer: 59 | Source: Intra-hospital | Attending: Physical Medicine & Rehabilitation | Admitting: Physical Medicine & Rehabilitation

## 2013-06-09 DIAGNOSIS — J301 Allergic rhinitis due to pollen: Secondary | ICD-10-CM | POA: Diagnosis present

## 2013-06-09 DIAGNOSIS — Z5189 Encounter for other specified aftercare: Principal | ICD-10-CM

## 2013-06-09 DIAGNOSIS — Z885 Allergy status to narcotic agent status: Secondary | ICD-10-CM

## 2013-06-09 DIAGNOSIS — I824Z9 Acute embolism and thrombosis of unspecified deep veins of unspecified distal lower extremity: Secondary | ICD-10-CM | POA: Diagnosis present

## 2013-06-09 DIAGNOSIS — M179 Osteoarthritis of knee, unspecified: Secondary | ICD-10-CM | POA: Diagnosis present

## 2013-06-09 DIAGNOSIS — K219 Gastro-esophageal reflux disease without esophagitis: Secondary | ICD-10-CM | POA: Diagnosis present

## 2013-06-09 DIAGNOSIS — D62 Acute posthemorrhagic anemia: Secondary | ICD-10-CM | POA: Diagnosis present

## 2013-06-09 DIAGNOSIS — Z8249 Family history of ischemic heart disease and other diseases of the circulatory system: Secondary | ICD-10-CM

## 2013-06-09 DIAGNOSIS — K567 Ileus, unspecified: Secondary | ICD-10-CM | POA: Diagnosis present

## 2013-06-09 DIAGNOSIS — M1710 Unilateral primary osteoarthritis, unspecified knee: Secondary | ICD-10-CM

## 2013-06-09 DIAGNOSIS — K9189 Other postprocedural complications and disorders of digestive system: Secondary | ICD-10-CM

## 2013-06-09 DIAGNOSIS — I10 Essential (primary) hypertension: Secondary | ICD-10-CM | POA: Diagnosis present

## 2013-06-09 DIAGNOSIS — I2699 Other pulmonary embolism without acute cor pulmonale: Secondary | ICD-10-CM | POA: Diagnosis not present

## 2013-06-09 DIAGNOSIS — Z96659 Presence of unspecified artificial knee joint: Secondary | ICD-10-CM

## 2013-06-09 DIAGNOSIS — F411 Generalized anxiety disorder: Secondary | ICD-10-CM | POA: Diagnosis present

## 2013-06-09 DIAGNOSIS — IMO0002 Reserved for concepts with insufficient information to code with codable children: Secondary | ICD-10-CM

## 2013-06-09 DIAGNOSIS — E876 Hypokalemia: Secondary | ICD-10-CM | POA: Diagnosis present

## 2013-06-09 DIAGNOSIS — K5900 Constipation, unspecified: Secondary | ICD-10-CM | POA: Diagnosis present

## 2013-06-09 DIAGNOSIS — Z9289 Personal history of other medical treatment: Secondary | ICD-10-CM

## 2013-06-09 DIAGNOSIS — Z87891 Personal history of nicotine dependence: Secondary | ICD-10-CM

## 2013-06-09 DIAGNOSIS — Z87442 Personal history of urinary calculi: Secondary | ICD-10-CM

## 2013-06-09 DIAGNOSIS — Z841 Family history of disorders of kidney and ureter: Secondary | ICD-10-CM

## 2013-06-09 DIAGNOSIS — D72829 Elevated white blood cell count, unspecified: Secondary | ICD-10-CM | POA: Diagnosis present

## 2013-06-09 HISTORY — DX: Other pulmonary embolism without acute cor pulmonale: I26.99

## 2013-06-09 LAB — PROTIME-INR
INR: 1.68 — ABNORMAL HIGH (ref 0.00–1.49)
Prothrombin Time: 19.3 seconds — ABNORMAL HIGH (ref 11.6–15.2)

## 2013-06-09 MED ORDER — TRAZODONE HCL 50 MG PO TABS: 25.0000 mg | ORAL_TABLET | Freq: Every evening | ORAL | Status: DC | PRN

## 2013-06-09 MED ORDER — BISACODYL 10 MG RE SUPP: 10.0000 mg | Freq: Every day | RECTAL | Status: DC | PRN

## 2013-06-09 MED ORDER — PROCHLORPERAZINE MALEATE 5 MG PO TABS
5.0000 mg | ORAL_TABLET | Freq: Four times a day (QID) | ORAL | Status: DC | PRN
  Filled 2013-06-09: qty 2

## 2013-06-09 MED ORDER — DIPHENHYDRAMINE HCL 12.5 MG/5ML PO ELIX: 12.5000 mg | ORAL_SOLUTION | ORAL | Status: DC | PRN

## 2013-06-09 MED ORDER — IPRATROPIUM-ALBUTEROL 0.5-2.5 (3) MG/3ML IN SOLN: 3.0000 mL | Freq: Four times a day (QID) | RESPIRATORY_TRACT | Status: DC | PRN

## 2013-06-09 MED ORDER — NYSTATIN 100000 UNIT/GM EX POWD
Freq: Two times a day (BID) | CUTANEOUS | Status: DC
  Administered 2013-06-09 – 2013-06-13 (×8): via TOPICAL
  Administered 2013-06-13: 1 g via TOPICAL
  Administered 2013-06-14 – 2013-06-17 (×6): via TOPICAL
  Filled 2013-06-09: qty 15

## 2013-06-09 MED ORDER — WARFARIN SODIUM 7.5 MG PO TABS
7.5000 mg | ORAL_TABLET | Freq: Once | ORAL | Status: AC
  Administered 2013-06-09: 7.5 mg via ORAL
  Filled 2013-06-09: qty 1

## 2013-06-09 MED ORDER — POLYETHYLENE GLYCOL 3350 17 G PO PACK
17.0000 g | PACK | Freq: Every day | ORAL | Status: DC
  Administered 2013-06-10 – 2013-06-15 (×6): 17 g via ORAL
  Filled 2013-06-09 (×9): qty 1

## 2013-06-09 MED ORDER — IRBESARTAN 75 MG PO TABS
75.0000 mg | ORAL_TABLET | Freq: Every day | ORAL | Status: DC
  Administered 2013-06-10 – 2013-06-17 (×8): 75 mg via ORAL
  Filled 2013-06-09 (×10): qty 1

## 2013-06-09 MED ORDER — FLEET ENEMA 7-19 GM/118ML RE ENEM: 1.0000 | ENEMA | Freq: Once | RECTAL | Status: AC | PRN

## 2013-06-09 MED ORDER — WARFARIN SODIUM 7.5 MG PO TABS
7.5000 mg | ORAL_TABLET | Freq: Once | ORAL | Status: DC
  Filled 2013-06-09: qty 1

## 2013-06-09 MED ORDER — ACETAMINOPHEN 325 MG PO TABS: 650.0000 mg | ORAL_TABLET | Freq: Four times a day (QID) | ORAL | Status: DC | PRN

## 2013-06-09 MED ORDER — METHOCARBAMOL 500 MG PO TABS
500.0000 mg | ORAL_TABLET | Freq: Four times a day (QID) | ORAL | Status: DC | PRN
  Administered 2013-06-10 – 2013-06-12 (×2): 500 mg via ORAL
  Filled 2013-06-09 (×2): qty 1

## 2013-06-09 MED ORDER — ACETAMINOPHEN 325 MG PO TABS: 325.0000 mg | ORAL_TABLET | ORAL | Status: DC | PRN

## 2013-06-09 MED ORDER — MONTELUKAST SODIUM 10 MG PO TABS
10.0000 mg | ORAL_TABLET | Freq: Every day | ORAL | Status: DC
  Administered 2013-06-10 – 2013-06-17 (×8): 10 mg via ORAL
  Filled 2013-06-09 (×9): qty 1

## 2013-06-09 MED ORDER — WARFARIN - PHARMACIST DOSING INPATIENT
Freq: Every day | Status: DC
  Administered 2013-06-13 – 2013-06-16 (×2)

## 2013-06-09 MED ORDER — SORBITOL 70 % SOLN
30.0000 mL | Status: AC
  Administered 2013-06-09: 30 mL via ORAL
  Filled 2013-06-09: qty 30

## 2013-06-09 MED ORDER — POLYETHYLENE GLYCOL 3350 17 G PO PACK: 17.0000 g | PACK | Freq: Every day | ORAL | Status: DC | PRN

## 2013-06-09 MED ORDER — METHOCARBAMOL 500 MG PO TABS: 500.0000 mg | ORAL_TABLET | Freq: Four times a day (QID) | ORAL | Status: DC | PRN

## 2013-06-09 MED ORDER — POLYSACCHARIDE IRON COMPLEX 150 MG PO CAPS
150.0000 mg | ORAL_CAPSULE | Freq: Two times a day (BID) | ORAL | Status: DC
  Administered 2013-06-10 – 2013-06-16 (×13): 150 mg via ORAL
  Filled 2013-06-09 (×16): qty 1

## 2013-06-09 MED ORDER — PHENOL 1.4 % MT LIQD
1.0000 | OROMUCOSAL | Status: DC | PRN
  Filled 2013-06-09: qty 177

## 2013-06-09 MED ORDER — ENOXAPARIN SODIUM 30 MG/0.3ML ~~LOC~~ SOLN
30.0000 mg | Freq: Two times a day (BID) | SUBCUTANEOUS | Status: DC
  Administered 2013-06-09 – 2013-06-11 (×4): 30 mg via SUBCUTANEOUS
  Filled 2013-06-09 (×6): qty 0.3

## 2013-06-09 MED ORDER — ENOXAPARIN SODIUM 30 MG/0.3ML ~~LOC~~ SOLN: 30.0000 mg | Freq: Two times a day (BID) | SUBCUTANEOUS | Status: DC

## 2013-06-09 MED ORDER — METOCLOPRAMIDE HCL 5 MG PO TABS: 5.0000 mg | ORAL_TABLET | Freq: Three times a day (TID) | ORAL | Status: DC | PRN

## 2013-06-09 MED ORDER — OXYCODONE HCL 5 MG PO TABS
5.0000 mg | ORAL_TABLET | ORAL | Status: DC | PRN
  Administered 2013-06-09 – 2013-06-16 (×10): 10 mg via ORAL
  Filled 2013-06-09 (×13): qty 2

## 2013-06-09 MED ORDER — ALUM HYDROXIDE-MAG TRISILICATE 80-20 MG PO CHEW
1.0000 | CHEWABLE_TABLET | Freq: Four times a day (QID) | ORAL | Status: DC | PRN
  Filled 2013-06-09: qty 2

## 2013-06-09 MED ORDER — TRAMADOL HCL 50 MG PO TABS: 50.0000 mg | ORAL_TABLET | Freq: Four times a day (QID) | ORAL | Status: DC | PRN

## 2013-06-09 MED ORDER — ALUM & MAG HYDROXIDE-SIMETH 200-200-20 MG/5ML PO SUSP: 30.0000 mL | Freq: Three times a day (TID) | ORAL | Status: DC | PRN

## 2013-06-09 MED ORDER — OXYCODONE HCL ER 10 MG PO T12A
10.0000 mg | EXTENDED_RELEASE_TABLET | Freq: Two times a day (BID) | ORAL | Status: DC
  Administered 2013-06-09 – 2013-06-17 (×16): 10 mg via ORAL
  Filled 2013-06-09 (×16): qty 1

## 2013-06-09 MED ORDER — ONDANSETRON HCL 4 MG PO TABS: 4.0000 mg | ORAL_TABLET | Freq: Four times a day (QID) | ORAL | Status: DC | PRN

## 2013-06-09 MED ORDER — OXYCODONE HCL 5 MG PO TABS: 5.0000 mg | ORAL_TABLET | ORAL | Status: DC | PRN

## 2013-06-09 MED ORDER — PANTOPRAZOLE SODIUM 40 MG PO TBEC
80.0000 mg | DELAYED_RELEASE_TABLET | Freq: Every day | ORAL | Status: DC
  Administered 2013-06-10 – 2013-06-17 (×8): 80 mg via ORAL
  Filled 2013-06-09 (×10): qty 2

## 2013-06-09 MED ORDER — GUAIFENESIN-DM 100-10 MG/5ML PO SYRP: 5.0000 mL | ORAL_SOLUTION | Freq: Four times a day (QID) | ORAL | Status: DC | PRN

## 2013-06-09 MED ORDER — MENTHOL 3 MG MT LOZG
1.0000 | LOZENGE | OROMUCOSAL | Status: DC | PRN
  Filled 2013-06-09: qty 9

## 2013-06-09 MED ORDER — WARFARIN SODIUM 7.5 MG PO TABS: 7.5000 mg | ORAL_TABLET | Freq: Once | ORAL | Status: DC

## 2013-06-09 MED ORDER — FLUTICASONE PROPIONATE 50 MCG/ACT NA SUSP
1.0000 | Freq: Every day | NASAL | Status: DC | PRN
  Filled 2013-06-09: qty 16

## 2013-06-09 MED ORDER — PROCHLORPERAZINE 25 MG RE SUPP
12.5000 mg | Freq: Four times a day (QID) | RECTAL | Status: DC | PRN
  Filled 2013-06-09: qty 1

## 2013-06-09 MED ORDER — NYSTATIN 100000 UNIT/ML MT SUSP
5.0000 mL | Freq: Every day | OROMUCOSAL | Status: DC
  Administered 2013-06-10 – 2013-06-17 (×8): 500000 [IU] via ORAL
  Filled 2013-06-09 (×9): qty 5

## 2013-06-09 MED ORDER — PROCHLORPERAZINE EDISYLATE 5 MG/ML IJ SOLN
5.0000 mg | Freq: Four times a day (QID) | INTRAMUSCULAR | Status: DC | PRN
  Filled 2013-06-09: qty 2

## 2013-06-09 MED ORDER — LORATADINE 10 MG PO TABS
10.0000 mg | ORAL_TABLET | Freq: Every day | ORAL | Status: DC
  Administered 2013-06-10 – 2013-06-17 (×8): 10 mg via ORAL
  Filled 2013-06-09 (×10): qty 1

## 2013-06-09 MED ORDER — TRAMADOL HCL 50 MG PO TABS
50.0000 mg | ORAL_TABLET | Freq: Four times a day (QID) | ORAL | Status: DC | PRN
  Administered 2013-06-10 – 2013-06-12 (×3): 50 mg via ORAL
  Filled 2013-06-09 (×3): qty 1

## 2013-06-09 MED ORDER — ALUM HYDROXIDE-MAG TRISILICATE 80-20 MG PO CHEW: 1.0000 | CHEWABLE_TABLET | Freq: Four times a day (QID) | ORAL | Status: DC | PRN

## 2013-06-09 MED ORDER — DSS 100 MG PO CAPS: 100.0000 mg | ORAL_CAPSULE | Freq: Two times a day (BID) | ORAL | Status: DC

## 2013-06-09 NOTE — H&P (Signed)
Physical Medicine and Rehabilitation Admission H&P  CC: Endstage OA bilateral Knees.  HPI: Sharon Daniels is a 63 y.o. 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 Patient elected to undergo B-TKR on 06/05/13 By Dr. Wynelle Link. Post op WBAT and on lovenox/coumadin bridge for DVT prophylaxis. Is WBAT with CPM to assist with ROM. ABLA with hgb 7.1 treated with 2 units PRBC. Therapies initiated and patient limited by fatigue, anxiety as well as dizziness question orthostasis. Therapies initiated and CIR recommended by Rehab team and MD therefore patient admitted today.    ROS  Knee pain, occasional hypoxia at night during activities. Constipation Otherwise ROS neg  Past Medical History   Diagnosis  Date   .  Allergy    .  Arthritis    .  Substance abuse    .  Anemia    .  Hypertension    .  H/O bronchitis    .  Pneumonia      hx of 10 years ago   .  Depression      hx of   .  History of kidney stones      x3   .  Complication of anesthesia      "during breast surgery: could hear the surgery, wake up too soon, anxious    Past Surgical History   Procedure  Laterality  Date   .  Cosmetic surgery   2013     in the office   .  Cesarean section   1989, 1992   .  Breast surgery   1985     abscess   .  Nasal sinus surgery   2000   .  Total knee arthroplasty  Bilateral  06/05/2013     Procedure: BILATERAL TOTAL KNEE ARTHROPLASTY; Surgeon: Gearlean Alf, MD; Location: [facility]; Service: Orthopedics; Laterality: Bilateral;    Family History   Problem  Relation  Age of Onset   .  Kidney disease  Mother    .  Atrial fibrillation  Mother    .  Heart attack  Father     Social History: Married. Independent PTA. Works part time as a Education officer, museum for [employer]. Per reports that she quit smoking about 17 months ago. Her smoking use included Cigarettes. She smoked 0.00 packs per day for 30 years. She has never used smokeless tobacco. She  has couple of alcoholic drinks daily. Per reports that she does not illicit drugs.  Allergies   Allergen  Reactions   .  Morphine  Itching     REACTION: rash, IV    Medications Prior to Admission   Medication  Sig  Dispense  Refill   .  Cholecalciferol (VITAMIN D-3) 5000 UNITS TABS  Take 5,000 Units by mouth daily.     Marland Kitchen  EVENING PRIMROSE OIL PO  Take 1 tablet by mouth once a week. Thursdays     .  fluticasone (FLONASE) 50 MCG/ACT nasal spray  Place 1 spray into both nostrils daily as needed for allergies or rhinitis.     .  Ipratropium-Albuterol (COMBIVENT RESPIMAT) 20-100 MCG/ACT AERS respimat  Inhale 1 puff into the lungs daily.     Marland Kitchen  loratadine (CLARITIN) 10 MG tablet  Take 10 mg by mouth daily.     .  montelukast (SINGULAIR) 10 MG tablet  Take 10 mg by mouth daily.     .  Naproxen Sodium (ALEVE) 220 MG  CAPS  Take 440 mg by mouth 2 (two) times daily as needed (Pain).     .  nystatin (MYCOSTATIN) 100000 UNIT/ML suspension  Take 5 mLs by mouth daily.     Marland Kitchen  nystatin (MYCOSTATIN/NYSTOP) 100000 UNIT/GM POWD  Apply topically 2 (two) times daily. Under breasts     .  omeprazole (PRILOSEC) 40 MG capsule  Take 40 mg by mouth daily.     Marland Kitchen  POTASSIUM PO  Take 1 tablet by mouth daily.     .  psyllium (HYDROCIL/METAMUCIL) 95 % PACK  Take 1 packet by mouth daily.     .  TURMERIC PO  Take 1 tablet by mouth daily.     .  valsartan (DIOVAN) 80 MG tablet  Take 80 mg by mouth every morning.     .  diclofenac sodium (VOLTAREN) 1 % GEL  Apply 1 application topically daily as needed (Knees).     Marland Kitchen  estradiol (ESTRACE) 0.1 MG/GM vaginal cream  Place 1.02 Applicatorfuls vaginally daily.     Marland Kitchen  MAGNESIUM PO  Take 1 tablet by mouth daily.     .  Multiple Vitamin (MULTIVITAMIN WITH MINERALS) TABS tablet  Take 1 tablet by mouth daily.     Marland Kitchen  Propylene Glycol (SYSTANE BALANCE) 0.6 % SOLN  Apply 1 drop to eye as needed (Dry eyes).     .  vitamin C (ASCORBIC ACID) 500 MG tablet  Take 500 mg by mouth daily.       Home:  Home Living  Family/patient expects to be discharged to:: Inpatient rehab  Living Arrangements: Spouse/significant other  Available Help at Discharge: Family;Available 24 hours/day;Other (Comment) (husband, daughter coming in from [employer])  Type of Home: House  Home Layout: Two level;Able to live on main level with bedroom/bathroom  Lives With: Spouse  Functional History:  Prior Function  Level of Independence: Independent with assistive device(s)  Comments: used cane or rollator  Functional Status:  Mobility:  Bed Mobility  Overal bed mobility: Needs Assistance;+2 for physical assistance  Bed Mobility: Sit to Supine  Supine to sit: +2 for physical assistance;Min assist  Sit to supine: +2 for physical assistance;Mod assist;Max assist  General bed mobility comments: 25% VC's onproper tech and mod assist to guide B LE on bed. Pt required increased time.  Transfers  Overall transfer level: Needs assistance  Equipment used: Rolling walker (2 wheeled)  Transfers: Sit to/from Stand  Sit to Stand: Max assist;+2 physical assistance  General transfer comment: 25% VCs for safe hand placement and to walk LEs fwd/back , increased pain. Asssited from recliner to [facility] then off BSC to amb a short distance to bed.  Ambulation/Gait  Ambulation/Gait assistance: +2 physical assistance;Min assist  Ambulation Distance (Feet): 7 Feet  Assistive device: Rolling walker (2 wheeled)  Gait Pattern/deviations: Step-to pattern;Decreased step length - right;Decreased step length - left;Wide base of support  Gait velocity: decr  General Gait Details: 25% VC's on proper walker to self placement and breathing to decrease c/o dizziness. Limited amb distance and tolerance as pt was c/o fatigue. Also noted increased anxiety esp during stand to sit.   ADL:  ADL  Overall ADL's : Needs assistance/impaired  Eating/Feeding: Set up;Sitting  Grooming: Set up;Sitting  Upper Body Bathing: Set up;Sitting  Lower  Body Bathing: Total assistance (with +2 physical A sit<>stand (mod))  Upper Body Dressing : Set up;Sitting  Lower Body Dressing: With adaptive equipment;Cueing for safety;Maximal assistance;Sitting/lateral leans  Toilet Transfer: Moderate assistance;+2 for  physical assistance;Ambulation;RW (Bil KIs, bed (raised>recliner))  Toileting- Clothing Manipulation and Hygiene: Total assistance (with +2 physical A (mod) for sit<>stand)  Functional mobility during ADLs: +2 for physical assistance;Moderate assistance  General ADL Comments: Pt sitting in a chair. OT initiated AE teaching. Pt instructed and use AE- reacher and sock aid. Explained where to obtain AE. Pt verbalized understanding.  Cognition:  Cognition  Overall Cognitive Status: Within Functional Limits for tasks assessed  Orientation Level: Oriented X4  Cognition  Arousal/Alertness: Awake/alert  Behavior During Therapy: WFL for tasks assessed/performed  Overall Cognitive Status: Within Functional Limits for tasks assessed    Physical Exam:  Blood pressure 100/64, pulse 101, temperature 99.6 F (37.6 C), temperature source Oral, resp. rate 16, height 5\' 8"  (1.727 m), weight 95.255 kg (210 lb), SpO2 95.00%.    Constitutional: She appears well-developed.  HENT:  Head: Normocephalic and atraumatic.  Eyes: Conjunctivae are normal. Pupils are equal, round, and reactive to light.  Neck: No JVD present. No tracheal deviation present. No thyromegaly present.  Cardiovascular: Normal rate. No murmur Respiratory: No respiratory distress.  GI: She exhibits mild distension. Scarce bowel sounds Musculoskeletal:   edema around either knee. Both knees tender to basic ROM/palpation. Inner thighs red, warm--no breakdown  Neurological: alert and oriented x 3. Cognitively appropriate UE's 5/5. KE and HF limited due to knee pain/surgery--grossly 1+ to 2 respectively. Normal ADF/APF bilaterally. No sensory changes. Dtr;s 1+ Psychiatric: She has a normal  mood and affect. Her behavior is normal. Thought content normal.   Results for orders placed during the hospital encounter of 06/05/13 (from the past 48 hour(s))   CBC Status: Abnormal    Collection Time    06/08/13 5:33 AM   Result  Value  Ref Range    WBC  11.4 (*)  4.0 - 10.5 K/uL    RBC  3.02 (*)  3.87 - 5.11 MIL/uL    Hemoglobin  9.2 (*)  12.0 - 15.0 g/dL    Comment:  DELTA CHECK NOTED     POST TRANSFUSION SPECIMEN    HCT  27.6 (*)  36.0 - 46.0 %    MCV  91.4  78.0 - 100.0 fL    MCH  30.5  26.0 - 34.0 pg    MCHC  33.3  30.0 - 36.0 g/dL    RDW  16.4 (*)  11.5 - 15.5 %    Platelets  188  150 - 400 K/uL   PROTIME-INR Status: Abnormal    Collection Time    06/08/13 5:33 AM   Result  Value  Ref Range    Prothrombin Time  16.5 (*)  11.6 - 15.2 seconds    INR  1.37  0.00 - 1.49   PROTIME-INR Status: Abnormal    Collection Time    06/09/13 5:24 AM   Result  Value  Ref Range    Prothrombin Time  19.3 (*)  11.6 - 15.2 seconds    INR  1.68 (*)  0.00 - 1.49    No results found.  Medical Problem List and Plan:  1. Functional deficits secondary to OA of bilateral knees s/p bilateral TKA's 2. DVT Prophylaxis/Anticoagulation: Pharmaceutical: Coumadin and Lovenox till INR therapeutic X 4 weeks followed by ASA 81 mg for 4 weeks.  3. Pain Management: Will add OxyContin CR 10 mg bid for more consistent relief as is using an average of oxycodone 70 mg/daily since surgery. Will continue prn oxycodone and advised patient to use ice past therapy sessions.  4. Mood: Team to provide ego support to help with anxiety issues. LCSW to follow for evaluation and support.  5. Neuropsych: This patient is capable of making decisions on her own behalf.  6. ABLA: Add iron supplement. Recheck labs in am.  7. HTN: Monitor BP every 8 hours. Blood pressures on low side therefore will set parameters on Avapro. Check orthostatic BP/pulse. Encourage/push po fluids.  8. H/o Seasonal allergies: continue Singulair and  Claritin. Encourage IS every hour to help with pulmonary toilet. Continue oxygen per [place] to maintain adequate oxygenation.  9. GERD: Stable on protonix.  10. Constipation: No BM post op. Will schedule Miralax daily and augment program as needed.  -sorbitol tonight   -consider KUB as she's fairly distended 11. Reactive leucocytosis: Recheck labs in am. Will monitor temp curve as well as incision for signs of infection.    Post Admission Physician Evaluation:  Functional deficits secondary To OA of bilateral knees s/p bilateral TKA's 1. Patient is admitted to receive collaborative, interdisciplinary care between the physiatrist, rehab nursing staff, and therapy team. 2. Patient's level of medical complexity and substantial therapy needs in context of that medical necessity cannot be provided at a lesser intensity of care such as a SNF. 3. Patient has experienced substantial functional loss from his/her baseline which was documented above under the "Functional History" and "Functional Status" headings. Judging by the patient's diagnosis, physical exam, and functional history, the patient has potential for functional progress which will result in measurable gains while on inpatient rehab. These gains will be of substantial and practical use upon discharge in facilitating mobility and self-care at the household level. 4. Physiatrist will provide 24 hour management of medical needs as well as oversight of the therapy plan/treatment and provide guidance as appropriate regarding the interaction of the two. 5. 24 hour rehab nursing will assist with bladder management, bowel management, safety, skin/wound care, disease management, medication administration, pain management and patient education and help integrate therapy concepts, techniques,education, etc. 6. PT will assess and treat for/with: Lower extremity strength, range of motion, stamina, balance, functional mobility, safety, adaptive techniques and  equipment, knee rom, pain. Goals are: mod I. 7. OT will assess and treat for/with: ADL's, functional mobility, safety, upper extremity strength, adaptive techniques and equipment, knee ROM, pain. Goals are: mod I. 8. SLP will assess and treat for/with: n/a. Goals are: n/a. 9. Case Management and Social Worker will assess and treat for psychological issues and discharge planning. 10. Team conference will be held weekly to assess progress toward goals and to determine barriers to discharge. 11. Patient will receive at least 3 hours of therapy per day at least 5 days per week. 12. ELOS: 10 days  13. Prognosis: excellent  Meredith Staggers, MD, [facility]   06/09/2013

## 2013-06-09 NOTE — Progress Notes (Signed)
Inpatient Rehabilitation  I met with Mrs. Cabanilla this am and discussed possible CIR  Admission.  I answered her questions and provided her a booklet on the program. She is requesting rehab admission.  I have submitted for insurance authorization and await their decision. I discussed with Arlee Muslim, PA and with pt's RN Judson Roch. I notified SW of the plan.   I plan to admit today pending authorization.  Please call if questions.  [place] Admissions Coordinator Cell [phone] Office [phone]

## 2013-06-09 NOTE — Progress Notes (Signed)
Came to visit patient on behalf of [facility] Care Management services for [place] employees/dependents with [facility] insurance. Explained [facility] program to her. She states she may be interested but wanted to speak with her doctor about it first. Left brochure and contact information at bedside. Will follow up with patient at later date on inpatient rehab unit. Linden Dolin- RN, BSN- [facility] [phone]

## 2013-06-09 NOTE — Progress Notes (Signed)
PMR Admission Coordinator Pre-Admission Assessment  Patient: Sharon Daniels is an 63 y.o., female  MRN: [id number]  DOB: 03-09-1950  Height: 5\' 8"  (172.7 cm)  Weight: 95.255 kg (210 lb)  Insurance Information  HMO: PPO: PCP: IPA: 80/20: OTHER:  PRIMARYJanit Bern Policy#: [id number] Subscriber: self  CM Name: Anda Kraft Phone#: [phone] Fax#: [phone] attn: Anda Kraft  Pre-Cert#: [id number] Employer: [employer]  Benefits: Phone #: [phone] Name: Fax only  Eff. Date: 04/09/10 Deduct: none Out of Pocket Max: $4000 Life Max: none  CIR: $500-80/20% SNF: 80/20%  Outpatient: 80% Co-Pay: 20%  Home Health: 80% Co-Pay: 20%  DME: 80% Co-Pay: 20%  Providers: In network   SECONDARY: Policy#: Subscriber:  CM Name: Phone#: Fax#:  Pre-Cert#: Employer:  Benefits: Phone #: Name:  Eff. Date: Deduct: Out of Pocket Max: Life Max:  CIR: SNF:  Outpatient: Co-Pay:  Home Health: Co-Pay:  DME: Co-Pay:  Medicaid Application Date: Case Manager:  Disability Application Date: Case Worker:  Emergency Contact Information  Contact Information    Name  Relation  Home  Work  Mobile    [place] A  Spouse  [phone]   [place]  Daughter  [phone]   [phone]      Current Medical History  Patient Admitting Diagnosis: OA of bilateral knees.  History of Present Illness: PHILIP ECKERSLEY is a 63 y.o. 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 Patient elected to undergo B-TKR on 06/05/13 By Dr. Wynelle Link. Post op WBAT and on lovenox/coumadin bridge for DVT prophylaxis. Is WBAT with CPM to assist with ROM. ABLA with hgb 7.1 treated with 2 units PRBC. Therapies initiated and patient limited by fatigue, anxiety as well as dizziness question orthostasis. Therapies initiated and CIR recommended by Rehab team and MD therefore patient admitted today.    Past Medical History  Past Medical History    Diagnosis  Date   .  Allergy    .  Arthritis    .  Substance abuse    .  Anemia    .  Hypertension    .  H/O bronchitis    .  Pneumonia      hx of 10 years ago   .  Depression      hx of   .  History of kidney stones      x3   .  Complication of anesthesia      "during breast surgery: could hear the surgery, wake up too soon, anxious    Family History  family history includes Atrial fibrillation in her mother; Heart attack in her father; Kidney disease in her mother.  Prior Rehab/Hospitalizations: none  Current Medications  Current facility-administered medications:acetaminophen (TYLENOL) suppository 650 mg, 650 mg, Rectal, Q6H PRN, Gearlean Alf, MD; acetaminophen (TYLENOL) tablet 650 mg, 650 mg, Oral, Q6H PRN, Gearlean Alf, MD, 650 mg at 06/08/13 2112; alum & mag hydroxide-simeth (MAALOX/MYLANTA) 200-200-20 MG/5ML suspension 30 mL, 30 mL, Oral, Q8H PRN, Gearlean Alf, MD, 30 mL at 06/07/13 1523  alum hydroxide-mag trisilicate (GAVISCON) 23-53 MG per chewable tablet 1-2 tablet, 1-2 tablet, Oral, QID PRN, Verlin Fester L Stilwell, PA-C; bisacodyl (DULCOLAX) suppository 10 mg, 10 mg, Rectal, Daily PRN, Gaynelle Arabian V, MD, 10 mg at 06/08/13 1738; dextrose 5 %-0.9 % sodium chloride infusion, , Intravenous, Continuous, Gaynelle Arabian V, MD; diphenhydrAMINE (BENADRYL) 12.5 MG/5ML elixir 12.5-25 mg, 12.5-25 mg, Oral, Q4H PRN, Gaynelle Arabian  V, MD  docusate sodium (COLACE) capsule 100 mg, 100 mg, Oral, BID, Gaynelle Arabian V, MD, 100 mg at 06/09/13 0941; enoxaparin (LOVENOX) injection 30 mg, 30 mg, Subcutaneous, Q12H, Bryson L Stilwell, PA-C, 30 mg at 06/09/13 0739; fluticasone (FLONASE) 50 MCG/ACT nasal spray 1 spray, 1 spray, Each Nare, Daily PRN, Gaynelle Arabian V, MD; HYDROmorphone (DILAUDID) injection 0.5-1 mg, 0.5-1 mg, Intravenous, Q1H PRN, Gaynelle Arabian V, MD, 1 mg at 06/07/13 1528  ipratropium-albuterol (DUONEB) 0.5-2.5 (3) MG/3ML nebulizer solution 3 mL, 3 mL, Nebulization, Daily, Gaynelle Arabian V, MD, 3 mL at 06/08/13 0848; irbesartan (AVAPRO) tablet 75 mg, 75 mg, Oral, Daily, Gaynelle Arabian V, MD, 75 mg at 06/09/13 0941; loratadine (CLARITIN) tablet 10 mg, 10 mg, Oral, Daily, Gaynelle Arabian V, MD, 10 mg at 06/09/13 0941; menthol-cetylpyridinium (CEPACOL) lozenge 3 mg, 1 lozenge, Oral, PRN, Gearlean Alf, MD  methocarbamol (ROBAXIN) 500 mg in dextrose 5 % 50 mL IVPB, 500 mg, Intravenous, Q6H PRN, Gaynelle Arabian V, MD, 500 mg at 06/05/13 1412; methocarbamol (ROBAXIN) tablet 500 mg, 500 mg, Oral, Q6H PRN, Gaynelle Arabian V, MD, 500 mg at 06/09/13 0740; metoCLOPramide (REGLAN) injection 5-10 mg, 5-10 mg, Intravenous, Q8H PRN, Gaynelle Arabian V, MD; metoCLOPramide (REGLAN) tablet 5-10 mg, 5-10 mg, Oral, Q8H PRN, Gearlean Alf, MD  montelukast (SINGULAIR) tablet 10 mg, 10 mg, Oral, Daily, Gaynelle Arabian V, MD, 10 mg at 06/09/13 0941; nystatin (MYCOSTATIN) 100000 UNIT/ML suspension 500,000 Units, 5 mL, Oral, Daily, Gaynelle Arabian V, MD, 500,000 Units at 06/09/13 0941; nystatin (MYCOSTATIN/NYSTOP) topical powder, , Topical, BID, Gaynelle Arabian V, MD; ondansetron (ZOFRAN) injection 4 mg, 4 mg, Intravenous, Q6H PRN, Gaynelle Arabian V, MD, 4 mg at 06/05/13 1616  ondansetron (ZOFRAN) tablet 4 mg, 4 mg, Oral, Q6H PRN, Gaynelle Arabian V, MD; oxyCODONE (Oxy IR/ROXICODONE) immediate release tablet 5-10 mg, 5-10 mg, Oral, Q3H PRN, Gaynelle Arabian V, MD, 10 mg at 06/09/13 0941; pantoprazole (PROTONIX) EC tablet 80 mg, 80 mg, Oral, Daily, Gaynelle Arabian V, MD, 80 mg at 06/09/13 0941; phenol (CHLORASEPTIC) mouth spray 1 spray, 1 spray, Mouth/Throat, PRN, Gearlean Alf, MD  polyethylene glycol (MIRALAX / GLYCOLAX) packet 17 g, 17 g, Oral, Daily PRN, Gaynelle Arabian V, MD, 17 g at 06/07/13 2018; traMADol (ULTRAM) tablet 50-100 mg, 50-100 mg, Oral, Q6H PRN, Gaynelle Arabian V, MD; warfarin (COUMADIN) tablet 7.5 mg, 7.5 mg, Oral, ONCE-1800, Dustin Brigitte Pulse, [facility]; Warfarin - Pharmacist Dosing Inpatient, , Does not apply, q1800,  Gearlean Alf, MD  Patients Current Diet: Regular diet  Precautions / Restrictions  Precautions  Precautions: Fall  Precautions/Special Needs: Weight Bearing Status  Precaution Comments: WBAT bilaterally  Restrictions  Weight Bearing Restrictions: No  Other Position/Activity Restrictions: wbat  Prior Activity Level  Community (5-7x/wk): Pt. 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 Recently has not been able to due to limitations from her [place] Devices/Equipment: Eyeglasses;Cane (specify quad or straight);Walker (specify type)  Prior Functional Level  Prior Function  Level of Independence: Independent with assistive device(s)  Comments: used cane or rollator  Current Functional Level  Cognition  Overall Cognitive Status: Within Functional Limits for tasks assessed  Orientation Level: Oriented X4   Extremity Assessment  (includes Sensation/Coordination)     ADLs  Overall ADL's : Needs assistance/impaired  Eating/Feeding: Set up;Sitting  Grooming: Set up;Sitting  Upper Body Bathing: Set  up;Sitting  Lower Body Bathing: Total assistance (with +2 physical A sit<>stand (mod))  Upper Body Dressing : Set up;Sitting  Lower Body Dressing: With adaptive equipment;Cueing for safety;Maximal assistance;Sitting/lateral leans  Toilet Transfer: Moderate assistance;+2 for physical assistance;Ambulation;RW (Bil KIs, bed (raised>recliner))  Toileting- Clothing Manipulation and Hygiene: Total assistance (with +2 physical A (mod) for sit<>stand)  Functional mobility during ADLs: +2 for physical assistance;Moderate assistance  General ADL Comments: Pt sitting in a chair. OT initiated AE teaching. Pt instructed and use AE- reacher and sock aid. Explained where to obtain AE. Pt verbalized understanding.   Mobility  Overal bed mobility: Needs  Assistance;+2 for physical assistance  Bed Mobility: Sit to Supine  Supine to sit: +2 for physical assistance;Min assist  Sit to supine: +2 for physical assistance;Mod assist;Max assist  General bed mobility comments: 25% VC's onproper tech and mod assist to guide B LE on bed. Pt required increased time.   Transfers  Overall transfer level: Needs assistance  Equipment used: Rolling walker (2 wheeled)  Transfers: Sit to/from Stand  Sit to Stand: Max assist;+2 physical assistance  General transfer comment: 25% VCs for safe hand placement and to walk LEs fwd/back , increased pain. Asssited from recliner to [facility] then off BSC to amb a short distance to bed.   Ambulation / Gait / Stairs / Wheelchair Mobility  Ambulation/Gait  Ambulation/Gait assistance: +2 physical assistance;Min assist  Ambulation Distance (Feet): 7 Feet  Assistive device: Rolling walker (2 wheeled)  Gait Pattern/deviations: Step-to pattern;Decreased step length - right;Decreased step length - left;Wide base of support  Gait velocity: decr  General Gait Details: 25% VC's on proper walker to self placement and breathing to decrease c/o dizziness. Limited amb distance and tolerance as pt was c/o fatigue. Also noted increased anxiety esp during stand to sit.   Posture / Balance    Special needs/care consideration  BiPAP/CPAP no  CPM Yes, bilateral  Continuous Drip IV no  Dialysis no  Life Vest no  Oxygen no  Special Bed no  Trach Size no  Wound Vac (area) no  Skin 2 knee surgical incisions, rash under breasts  Bowel mgmt: Small BM 06/08/13  Bladder mgmt: continent, has been using bed pan  Diabetic mgmt no   Previous Home Environment  Living Arrangements: Spouse/significant other  Lives With: Spouse  Available Help at Discharge: Family;Available 24 hours/day;Other (Comment) (husband, daughter coming in from [place])  Type of Home: House  Home Layout: Two level;Able to live on main level with bedroom/bathroom  Bathroom  Shower/Tub: Tourist information centre manager: Handicapped height  Bathroom Accessibility: Yes  How Accessible: Accessible via walker  [place]: No  Discharge Living Setting  Plans for Discharge Living Setting: Patient's home  Type of Home at Discharge: House  Discharge Home Layout: Two level;Able to live on main level with bedroom/bathroom  Discharge Bathroom Shower/Tub: Walk-in shower  Discharge Bathroom Toilet: Handicapped height  Discharge Bathroom Accessibility: Yes  How Accessible: Accessible via walker  Does the patient have any problems obtaining your medications?: No  Social/Family/Support Systems  Patient Roles: Spouse;Parent  Anticipated Caregiver: Husband Alex as well as daughter who will be coming in from [place]  Ability/Limitations of Caregiver: husband works and Publishing copy thru [employer]; daughter can stay for a week  Caregiver Availability: 24/7  Discharge Plan Discussed with Primary Caregiver: No  Does Caregiver/Family have Issues with Lodging/Transportation while Pt is in Rehab?: No  Goals/Additional Needs  Patient/Family Goal for Rehab: mod (I) for PT and OT  Expected length of stay: 7 to 10 days  Cultural Considerations: none  Dietary Needs: regular diet  Equipment Needs: TBD  Pt/Family Agrees to Admission and willing to participate: Yes  Program Orientation Provided & Reviewed with Pt/Caregiver Including Roles & Responsibilities: Yes  Decrease burden of Care through IP rehab admission:  no  Possible need for SNF placement upon discharge: Not anticipated  Patient Condition: This patient's condition remains as documented in the consult dated 06/08/13, in which the Rehabilitation Physician determined and documented that the patient's condition is appropriate for intensive rehabilitative care in an inpatient rehabilitation facility. Will admit to inpatient rehab today.  Preadmission Screen Completed By: Tressie Stalker , 06/09/2013 9:51 AM   ______________________________________________________________________  Discussed status with Dr. Naaman Plummer on 06/09/13 at 1259 and received telephone approval for admission today.  Admission Coordinator: Gerlean Ren, time 1259 Sudie Grumbling 06/09/13  Cosigned by: Meredith Staggers, MD [06/09/2013 1:13 PM]

## 2013-06-09 NOTE — Progress Notes (Signed)
   Subjective: 4 Days Post-Op Procedure(s) (LRB): BILATERAL TOTAL KNEE ARTHROPLASTY (Bilateral) Patient reports pain as mild.   Patient seen in rounds with Dr. Wynelle Link. Patient is well, and has had no acute complaints or problems. Seen by CIR. Appreciate consult.  Possible transfer over to [facility] if approved by insurance.  Possibility for today if approval comes thru.  Plan is to go Rehab after hospital stay.  Objective: Vital signs in last 24 hours: Temp:  [98.8 F (37.1 C)-100 F (37.8 C)] 99.6 F (37.6 C) (06/02 0601) Pulse Rate:  [87-101] 101 (06/02 0601) Resp:  [14-16] 16 (06/02 0601) BP: (97-125)/(64-80) 100/64 mmHg (06/02 0601) SpO2:  [92 %-98 %] 98 % (06/02 0601)  Intake/Output from previous day:  Intake/Output Summary (Last 24 hours) at 06/09/13 0843 Last data filed at 06/09/13 0341  Gross per 24 hour  Intake    240 ml  Output    500 ml  Net   -260 ml    Labs:  Recent Labs  06/07/13 0538 06/08/13 0533  HGB 7.1* 9.2*    Recent Labs  06/07/13 0538 06/08/13 0533  WBC 12.5* 11.4*  RBC 2.28* 3.02*  HCT 21.9* 27.6*  PLT 190 188    Recent Labs  06/07/13 0538  NA 141  K 4.2  CL 104  BUN 16  CREATININE 0.66  GLUCOSE 124*  CALCIUM 8.5    Recent Labs  06/07/13 0538 06/08/13 0533 06/09/13 0524  INR 1.03 1.37 1.68*    EXAM General - Patient is Alert, Appropriate and Oriented Extremity - Neurovascular intact Sensation intact distally Dorsiflexion/Plantar flexion intact No cellulitis present Dressing/Incision - clean, dry, no drainage, healing Motor Function - intact, moving feet and toes well on exam.    Past Medical History  Diagnosis Date  . Allergy   . Arthritis   . Substance abuse   . Anemia   . Hypertension   . H/O bronchitis   . Pneumonia     hx of 10 years ago  . Depression     hx of  . History of kidney stones     x3  . Complication of anesthesia     "during breast surgery: could hear the surgery, wake up too soon,  anxious    Assessment/Plan: 4 Days Post-Op Procedure(s) (LRB): BILATERAL TOTAL KNEE ARTHROPLASTY (Bilateral) Principal Problem:   OA (osteoarthritis) of knee Active Problems:   Postoperative anemia due to acute blood loss   Transfusion history  Estimated body mass index is 31.94 kg/(m^2) as calculated from the following:   Height as of this encounter: 5\' 8"  (1.727 m).   Weight as of this encounter: 95.255 kg (210 lb). Up with therapy HGB 9.2 yesterday following blood transfusion.  DVT Prophylaxis - Lovenox and Coumadin  Weight-Bearing as tolerated to both legs  Take Coumadin for four weeks and then discontinue.  The dose may need to be adjusted based upon the INR.  Please follow the INR and titrate Coumadin dose for a therapeutic range between 2.0 and 3.0 INR.  After completing the four weeks of Coumadin, the patient should take an 81 mg Aspirin daily for four additional weeks.  Lovenox injections will start later this evening after the epidural has been removed and continue until the INR is therapeutic at or greater than 2.0.  When INR reaches the therapeutic level of equal to or greater than 2.0, the patient may discontinue the Lovenox injections.  Arlee Muslim, PA-C Orthopaedic Surgery 06/09/2013, 8:43 AM

## 2013-06-09 NOTE — Progress Notes (Signed)
ANTICOAGULATION CONSULT NOTE - Follow Up Consult  Pharmacy Consult for Warfarin Indication: VTE prophylaxis  Allergies  Allergen Reactions  . Morphine Itching    REACTION: rash, IV    Patient Measurements: Height: 5\' 8"  (172.7 cm) Weight: 210 lb (95.255 kg) IBW/kg (Calculated) : 63.9  Vital Signs:  Temp: 99.6 F (37.6 C) (06/02 0601) Temp src: Oral (06/02 0601) BP: 100/64 mmHg (06/02 0601) Pulse Rate: 101 (06/02 0601)  Labs:  Recent Labs  06/07/13 0538 06/08/13 0533 06/09/13 0524  HGB 7.1* 9.2*  --   HCT 21.9* 27.6*  --   PLT 190 188  --   LABPROT 13.3 16.5* 19.3*  INR 1.03 1.37 1.68*  CREATININE 0.66  --   --     Estimated Creatinine Clearance: 89.2 ml/min (by C-G formula based on Cr of 0.66).   Medications:  Scheduled:  . docusate sodium  100 mg Oral BID  . enoxaparin (LOVENOX) injection  30 mg Subcutaneous Q12H  . ipratropium-albuterol  3 mL Nebulization Daily  . irbesartan  75 mg Oral Daily  . loratadine  10 mg Oral Daily  . montelukast  10 mg Oral Daily  . nystatin  5 mL Oral Daily  . nystatin   Topical BID  . pantoprazole  80 mg Oral Daily  . Warfarin - Pharmacist Dosing Inpatient   Does not apply q1800   Infusions:  . bupivacaine (MARCAINE, SENSORCAINE) epidural 14 mL/hr at 06/07/13 0606  . dextrose 5 % and 0.9% NaCl Stopped (06/07/13 1500)   Inpatient warfarin doses administered 5/30-6/1: 4 mg [capped dose per protocol due to presence of epidural catheter], 7.5 mg x 1, 7.5mg  x 1  Goal of Therapy:  INR 2-3 Monitor platelets by anticoagulation protocol: Yes   Assessment: 21 yoF s/p bilateral total knee arthroplasty 5/29.  Post-op she had epidural infusion until AM of 5/31 (epidural catheter removed by anesthesiologist at  [phone] 5/31). Pharmacy was consulted to dose warfarin for VTE prophylaxis beginning POD#1 (5/30).  Prophylactic-dose LMWH was started by ortho on 5/31 twelve hours after removal of the epidural catheter.  6/2 POD#4  INR =  1.68 - responding to 7.5mg  x 2 doses but remains SUBtherapeutic. (first dose on 5/30 was reduced by protocol due to presence of epidural catheter).  Remains on Lovenox 30 mg SQ q12h per ortho  SCr yesterday WNL and stable.  Current Lovenox dosage remains appropriate while CrCl >= 30 mL/min  Hgb improved after 2 units PRBC 5/31. Pltc WNL  No overt bleeding reported..  No significant drug-drug interactions noted.  Plan:   Warfarin 7.5mg  PO today at 1800 (then expect will need to reduce dose)  Continue prophylactic-dose Lovenox as ordered by ortho until INR > 2.  Daily INR while inpatient  Doreene Eland, PharmD, BCPS.   Pager: [phone] 06/09/2013  8:13 AM

## 2013-06-09 NOTE — PMR Pre-admission (Signed)
PMR Admission Coordinator Pre-Admission Assessment  Patient: Sharon Daniels is an 63 y.o., female MRN: [id number] DOB: 1950-08-18 Height: 5\' 8"  (172.7 cm) Weight: 95.255 kg (210 lb)              Insurance Information HMO:     PPO:      PCP:      IPA:      80/20:      OTHER:  PRIMARYJanit Bern     Policy#: [id number]      Subscriber:  self CM Name:  Anda Kraft      Phone#: [phone] [id number]     Fax#: [phone] attn: Anda Kraft Pre-Cert#: [id number]      Employer:  [employer] Benefits:  Phone #:  [phone]     Name:  Fax only Eff. Date: 04/09/10     Deduct:  none      Out of Pocket Max: $4000      Life Max:  none CIR:  $500-80/20%      SNF: 80/20% Outpatient: 80%     Co-Pay: 20% Home Health: 80%      Co-Pay: 20% DME: 80%     Co-Pay: 20% Providers:  In network  SECONDARY:       Policy#:      Subscriber:  CM Name:       Phone#:      Fax#:  Pre-Cert#:       Employer:  Benefits:  Phone #:      Name:  Eff. Date:      Deduct:       Out of Pocket Max:       Life Max:  CIR:       SNF:  Outpatient:      Co-Pay:  Home Health:       Co-Pay:  DME:      Co-Pay:   Medicaid Application Date:       Case Manager:  Disability Application Date:       Case Worker:   Emergency Contact Information Contact Information   Name Relation Home Work Mobile   Howe A Spouse [phone]  [place] Daughter [phone]  [phone]     Current Medical History  Patient Admitting Diagnosis: OA of bilateral knees.   History of Present Illness: Sharon Daniels is a 63 y.o. 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 Patient elected to undergo B-TKR on 06/05/13 By Dr. Wynelle Link. Post op WBAT and on lovenox/coumadin bridge for DVT prophylaxis. Is WBAT with CPM to assist with ROM. ABLA with hgb 7.1 treated with 2 units PRBC. Therapies initiated and patient limited by fatigue, anxiety as well as dizziness  question orthostasis. Therapies initiated and CIR recommended by Rehab team and MD therefore patient admitted today.       Past Medical History  Past Medical History  Diagnosis Date  . Allergy   . Arthritis   . Substance abuse   . Anemia   . Hypertension   . H/O bronchitis   . Pneumonia     hx of 10 years ago  . Depression     hx of  . History of kidney stones     x3  . Complication of anesthesia     "during breast surgery: could hear the surgery, wake up too soon, anxious    Family History  family history includes Atrial fibrillation in her mother; Heart attack in her  father; Kidney disease in her mother.  Prior Rehab/Hospitalizations: none   Current Medications  Current facility-administered medications:acetaminophen (TYLENOL) suppository 650 mg, 650 mg, Rectal, Q6H PRN, Gearlean Alf, MD;  acetaminophen (TYLENOL) tablet 650 mg, 650 mg, Oral, Q6H PRN, Gearlean Alf, MD, 650 mg at 06/08/13 2112;  alum & mag hydroxide-simeth (MAALOX/MYLANTA) 200-200-20 MG/5ML suspension 30 mL, 30 mL, Oral, Q8H PRN, Gearlean Alf, MD, 30 mL at 06/07/13 1523 alum hydroxide-mag trisilicate (GAVISCON) 35-00 MG per chewable tablet 1-2 tablet, 1-2 tablet, Oral, QID PRN, Verlin Fester L Stilwell, PA-C;  bisacodyl (DULCOLAX) suppository 10 mg, 10 mg, Rectal, Daily PRN, Gearlean Alf, MD, 10 mg at 06/08/13 1738;  dextrose 5 %-0.9 % sodium chloride infusion, , Intravenous, Continuous, Gearlean Alf, MD;  diphenhydrAMINE (BENADRYL) 12.5 MG/5ML elixir 12.5-25 mg, 12.5-25 mg, Oral, Q4H PRN, Gearlean Alf, MD docusate sodium (COLACE) capsule 100 mg, 100 mg, Oral, BID, Gaynelle Arabian V, MD, 100 mg at 06/09/13 0941;  enoxaparin (LOVENOX) injection 30 mg, 30 mg, Subcutaneous, Q12H, Bryson L Stilwell, PA-C, 30 mg at 06/09/13 0739;  fluticasone (FLONASE) 50 MCG/ACT nasal spray 1 spray, 1 spray, Each Nare, Daily PRN, Gaynelle Arabian V, MD;  HYDROmorphone (DILAUDID) injection 0.5-1 mg, 0.5-1 mg, Intravenous, Q1H PRN,  Gaynelle Arabian V, MD, 1 mg at 06/07/13 1528 ipratropium-albuterol (DUONEB) 0.5-2.5 (3) MG/3ML nebulizer solution 3 mL, 3 mL, Nebulization, Daily, Gaynelle Arabian V, MD, 3 mL at 06/08/13 0848;  irbesartan (AVAPRO) tablet 75 mg, 75 mg, Oral, Daily, Gaynelle Arabian V, MD, 75 mg at 06/09/13 0941;  loratadine (CLARITIN) tablet 10 mg, 10 mg, Oral, Daily, Gaynelle Arabian V, MD, 10 mg at 06/09/13 0941;  menthol-cetylpyridinium (CEPACOL) lozenge 3 mg, 1 lozenge, Oral, PRN, Gearlean Alf, MD methocarbamol (ROBAXIN) 500 mg in dextrose 5 % 50 mL IVPB, 500 mg, Intravenous, Q6H PRN, Gaynelle Arabian V, MD, 500 mg at 06/05/13 1412;  methocarbamol (ROBAXIN) tablet 500 mg, 500 mg, Oral, Q6H PRN, Gaynelle Arabian V, MD, 500 mg at 06/09/13 0740;  metoCLOPramide (REGLAN) injection 5-10 mg, 5-10 mg, Intravenous, Q8H PRN, Gaynelle Arabian V, MD;  metoCLOPramide (REGLAN) tablet 5-10 mg, 5-10 mg, Oral, Q8H PRN, Gearlean Alf, MD montelukast (SINGULAIR) tablet 10 mg, 10 mg, Oral, Daily, Gaynelle Arabian V, MD, 10 mg at 06/09/13 0941;  nystatin (MYCOSTATIN) 100000 UNIT/ML suspension 500,000 Units, 5 mL, Oral, Daily, Gaynelle Arabian V, MD, 500,000 Units at 06/09/13 0941;  nystatin (MYCOSTATIN/NYSTOP) topical powder, , Topical, BID, Gaynelle Arabian V, MD;  ondansetron (ZOFRAN) injection 4 mg, 4 mg, Intravenous, Q6H PRN, Gaynelle Arabian V, MD, 4 mg at 06/05/13 1616 ondansetron (ZOFRAN) tablet 4 mg, 4 mg, Oral, Q6H PRN, Gaynelle Arabian V, MD;  oxyCODONE (Oxy IR/ROXICODONE) immediate release tablet 5-10 mg, 5-10 mg, Oral, Q3H PRN, Gaynelle Arabian V, MD, 10 mg at 06/09/13 0941;  pantoprazole (PROTONIX) EC tablet 80 mg, 80 mg, Oral, Daily, Gaynelle Arabian V, MD, 80 mg at 06/09/13 0941;  phenol (CHLORASEPTIC) mouth spray 1 spray, 1 spray, Mouth/Throat, PRN, Gearlean Alf, MD polyethylene glycol (MIRALAX / GLYCOLAX) packet 17 g, 17 g, Oral, Daily PRN, Gaynelle Arabian V, MD, 17 g at 06/07/13 2018;  traMADol (ULTRAM) tablet 50-100 mg, 50-100 mg, Oral, Q6H PRN, Gaynelle Arabian  V, MD;  warfarin (COUMADIN) tablet 7.5 mg, 7.5 mg, Oral, ONCE-1800, [place] Riley, [facility];  Warfarin - Pharmacist Dosing Inpatient, , Does not apply, q1800, Gearlean Alf, MD  Patients Current Diet:   Regular diet  Precautions / Restrictions Precautions Precautions: Fall  Precautions/Special Needs: Weight Bearing Status Precaution Comments: WBAT bilaterally Restrictions Weight Bearing Restrictions: No Other Position/Activity Restrictions: wbat   Prior Activity Level Community (5-7x/wk): Pt. works on weekends as as Education officer, museum at [employer].  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  Recently has not been able to  due to limitations from her [place] Devices/Equipment: Eyeglasses;Cane (specify quad or straight);Walker (specify type)  Prior Functional Level Prior Function Level of Independence: Independent with assistive device(s) Comments: used cane or rollator  Current Functional Level Cognition  Overall Cognitive Status: Within Functional Limits for tasks assessed Orientation Level: Oriented X4    Extremity Assessment (includes Sensation/Coordination)          ADLs  Overall ADL's : Needs assistance/impaired Eating/Feeding: Set up;Sitting Grooming: Set up;Sitting Upper Body Bathing: Set up;Sitting Lower Body Bathing: Total assistance (with +2 physical A sit<>stand (mod)) Upper Body Dressing : Set up;Sitting Lower Body Dressing: With adaptive equipment;Cueing for safety;Maximal assistance;Sitting/lateral leans Toilet Transfer: Moderate assistance;+2 for physical assistance;Ambulation;RW (Bil KIs, bed (raised>recliner)) Toileting- Clothing Manipulation and Hygiene: Total assistance (with +2 physical A (mod) for sit<>stand) Functional mobility during ADLs: +2 for physical assistance;Moderate assistance General ADL Comments: Pt sitting in a chair. OT initiated AE teaching.   Pt instructed and use AE- reacher and sock aid.  Explained where to obtain AE. Pt verbalized understanding.      Mobility  Overal bed mobility: Needs Assistance;+2 for physical assistance Bed Mobility: Sit to Supine Supine to sit: +2 for physical assistance;Min assist Sit to supine: +2 for physical assistance;Mod assist;Max assist General bed mobility comments: 25% VC's onproper tech and mod assist to guide B LE on bed.  Pt required increased time.    Transfers  Overall transfer level: Needs assistance Equipment used: Rolling walker (2 wheeled) Transfers: Sit to/from Stand Sit to Stand: Max assist;+2 physical assistance General transfer comment: 25% VCs for safe hand placement and to walk LEs fwd/back , increased pain.  Asssited from recliner to [facility] then off BSC to amb a short distance to bed.    Ambulation / Gait / Stairs / Wheelchair Mobility  Ambulation/Gait Ambulation/Gait assistance: +2 physical assistance;Min assist Ambulation Distance (Feet): 7 Feet Assistive device: Rolling walker (2 wheeled) Gait Pattern/deviations: Step-to pattern;Decreased step length - right;Decreased step length - left;Wide base of support Gait velocity: decr General Gait Details: 25% VC's on proper walker to self placement and breathing to decrease c/o dizziness.  Limited amb distance and tolerance as pt was c/o fatigue.  Also noted increased anxiety esp during stand to sit.     Posture / Balance      Special needs/care consideration BiPAP/CPAP  no CPM  Yes, bilateral Continuous Drip IV  no Dialysis   no         Life Vest  no Oxygen  no Special Bed  no Trach Size  no Wound Vac (area)  no       Skin  2 knee surgical incisions, rash under breasts                               Bowel mgmt:   Small BM 06/08/13 Bladder mgmt: continent, has been using bed pan Diabetic mgmt no     Previous Home Environment Living Arrangements: Spouse/significant other  Lives With: Spouse Available Help at Discharge:  Family;Available 24 hours/day;Other (Comment) (husband, daughter coming in  from [place]) Type of Home: House Home Layout: Two level;Able to live on main level with bedroom/bathroom Bathroom Shower/Tub: Multimedia programmer: Handicapped height Bathroom Accessibility: Yes How Accessible: Accessible via walker [place]: No  Discharge Living Setting Plans for Discharge Living Setting: Patient's home Type of Home at Discharge: House Discharge Home Layout: Two level;Able to live on main level with bedroom/bathroom Discharge Bathroom Shower/Tub: Walk-in shower Discharge Bathroom Toilet: Handicapped height Discharge Bathroom Accessibility: Yes How Accessible: Accessible via walker Does the patient have any problems obtaining your medications?: No  Social/Family/Support Systems Patient Roles: Spouse;Parent Anticipated Caregiver: Husband Alex as well as daughter who will be coming in from [place] Ability/Limitations of Caregiver: husband works and Publishing copy thru [employer]; daughter can stay for a week Caregiver Availability: 24/7 Discharge Plan Discussed with Primary Caregiver: No Does Caregiver/Family have Issues with Lodging/Transportation while Pt is in Rehab?: No    Goals/Additional Needs Patient/Family Goal for Rehab: mod (I) for PT and OT Expected length of stay: 7 to 10 days Cultural Considerations: none Dietary Needs: regular diet Equipment Needs: TBD Pt/Family Agrees to Admission and willing to participate: Yes Program Orientation Provided & Reviewed with Pt/Caregiver Including Roles  & Responsibilities: Yes   Decrease burden of Care through IP rehab admission:  no  Possible need for SNF placement upon discharge:  Not anticipated   Patient Condition: This patient's condition remains as documented in the consult dated 06/08/13, in which the Rehabilitation Physician determined and documented that the patient's condition is appropriate for intensive  rehabilitative care in an inpatient rehabilitation facility. Will admit to inpatient rehab today.  Preadmission Screen Completed By:  Tressie Stalker , 06/09/2013 9:51 AM ______________________________________________________________________   Discussed status with Dr. Naaman Plummer on 06/09/13 at  1259  and received telephone approval for admission today.  Admission Coordinator:  Gerlean Ren, time 1259 Sudie Grumbling 06/09/13

## 2013-06-09 NOTE — Progress Notes (Signed)
Physical Therapy Treatment Patient Details Name: Sharon Daniels MRN: [id number] DOB: 11-22-1950 Today's Date: 06/09/2013    History of Present Illness Bil TKAs    PT Comments    Pt OOB in recliner requesting to void.  + 2 assist to [facility] pt required increased VC's as she demon increased anxiety/urgency to void.  50% VC's on turn completion and hand placement along with instruction to walk B LE's forward as she declines to sit.  Pt amb limited distance from [facility] to bed then assisted back to bed.  Follow Up Recommendations  CIR     Equipment Recommendations       Recommendations for Other Services       Precautions / Restrictions Precautions Precautions: Fall Precaution Comments: WBAT bilaterally Required Braces or Orthoses: Knee Immobilizer - Right Restrictions Weight Bearing Restrictions: No Other Position/Activity Restrictions: wbat    Mobility  Bed Mobility Overal bed mobility: Needs Assistance;+2 for physical assistance Bed Mobility: Sit to Supine       Sit to supine: +2 for physical assistance;Mod assist;Max assist      Transfers Overall transfer level: Needs assistance Equipment used: Rolling walker (2 wheeled) Transfers: Sit to/from Stand Sit to Stand: Max assist;+2 physical assistance         General transfer comment: increased time and 50% VC's on proper tech and hand placement as pt demon increased anxiety/urgency to void.  Assisted out of recliner tyo BSC then off BSC to bed.  Ambulation/Gait   Ambulation Distance (Feet): 3 Feet Assistive device: Rolling walker (2 wheeled) Gait Pattern/deviations: Step-to pattern;Decreased step length - right;Decreased step length - left;Wide base of support Gait velocity: decr   General Gait Details: 50% VC's on proper safety with turns and to walk B LE's out during stand to sit.  Still requires + 2 assist.  KI on R LE.     Stairs            Wheelchair Mobility    Modified Rankin (Stroke Patients  Only)       Balance                                    Cognition                            Exercises      General Comments        Pertinent Vitals/Pain C/o max fatigue    Home Living     Available Help at Discharge: Family;Available 24 hours/day;Other (Comment) (husband, daughter coming in from [place]) Type of Home: House     Home Layout: Two level;Able to live on main level with bedroom/bathroom        Prior Function            PT Goals (current goals can now be found in the care plan section) Progress towards PT goals: Progressing toward goals    Frequency  7X/week    PT Plan      Co-evaluation             End of Session Equipment Utilized During Treatment: Gait belt;Right knee immobilizer Activity Tolerance: Patient limited by fatigue Patient left: in bed;with call bell/phone within reach     Time: 1129-1155 PT Time Calculation (min): 26 min  Charges:  $Gait Training: 8-22 mins $Therapeutic Activity: 8-22 mins  G Codes:      Rica Koyanagi  PTA [facility]  Acute  Rehab Pager      [phone]

## 2013-06-09 NOTE — Discharge Summary (Signed)
Physician Discharge Summary   Patient ID: Sharon Daniels MRN: [id number] DOB/AGE: 06-28-1950 63 y.o.  Admit date: 06/05/2013 Discharge date: Tentative Date of Discharge - 06/09/2013 (Pending Insurance Approval)  Primary Diagnosis:  Osteoarthritis Bilateral knee(s)  Admission Diagnoses:  Past Medical History  Diagnosis Date  . Allergy   . Arthritis   . Substance abuse   . Anemia   . Hypertension   . H/O bronchitis   . Pneumonia     hx of 10 years ago  . Depression     hx of  . History of kidney stones     x3  . Complication of anesthesia     "during breast surgery: could hear the surgery, wake up too soon, anxious   Discharge Diagnoses:   Principal Problem:   OA (osteoarthritis) of knee Active Problems:   Postoperative anemia due to acute blood loss   Transfusion history  Estimated body mass index is 31.94 kg/(m^2) as calculated from the following:   Height as of this encounter: _0  (1.727 m).   Weight as of this encounter: 95.255 kg (210 lb).  Procedure:  Procedure(s) (LRB): BILATERAL TOTAL KNEE ARTHROPLASTY (Bilateral)   Consults: [facility]  HPI: Sharon Daniels is a 63 y.o. year old female with end stage OA of both knees with progressively worsening pain and dysfunction. She has constant pain, with activity and at rest and significant functional deficits with difficulties even with ADLs. She has had extensive non-op management including analgesics, injections of cortisone and viscosupplements, and home exercise program, but remains in significant pain with significant dysfunction. We discussed replacing both knees in the same setting versus one at a time including procedure, risks, potential complications, rehab course, and pros and cons associated with each and the patient elects to do both knees at the same time. She presents now for right Total Knee Arthroplasty.   Laboratory Data: Admission on 06/05/2013  Component Date Value Ref Range Status    . ABO/RH(D) 06/05/2013 A POS   Final  . Antibody Screen 06/05/2013 NEG   Final  . Sample Expiration 06/05/2013 06/08/2013   Final  . Unit Number 06/05/2013 [id number]   Final  . Blood Component Type 06/05/2013 RED CELLS,LR   Final  . Unit division 06/05/2013 00   Final  . Status of Unit 06/05/2013 ISSUED,FINAL   Final  . Transfusion Status 06/05/2013 OK TO TRANSFUSE   Final  . Crossmatch Result 06/05/2013 Compatible   Final  . Unit Number 06/05/2013 [id number]   Final  . Blood Component Type 06/05/2013 RED CELLS,LR   Final  . Unit division 06/05/2013 00   Final  . Status of Unit 06/05/2013 ISSUED,FINAL   Final  . Transfusion Status 06/05/2013 OK TO TRANSFUSE   Final  . Crossmatch Result 06/05/2013 Compatible   Final  . ABO/RH(D) 06/05/2013 A POS   Final  . WBC 06/06/2013 10.3  4.0 - 10.5 K/uL Final  . RBC 06/06/2013 2.56* 3.87 - 5.11 MIL/uL Final  . Hemoglobin 06/06/2013 7.9* 12.0 - 15.0 g/dL Final  . HCT 06/06/2013 24.4* 36.0 - 46.0 % Final  . MCV 06/06/2013 95.3  78.0 - 100.0 fL Final  . MCH 06/06/2013 30.9  26.0 - 34.0 pg Final  . MCHC 06/06/2013 32.4  30.0 - 36.0 g/dL Final  . RDW 06/06/2013 13.9  11.5 - 15.5 % Final  . Platelets 06/06/2013 229  150 - 400 K/uL Final  . Sodium 06/06/2013 139  137 - 147 mEq/L Final  .  Potassium 06/06/2013 4.6  3.7 - 5.3 mEq/L Final  . Chloride 06/06/2013 103  96 - 112 mEq/L Final  . CO2 06/06/2013 28  19 - 32 mEq/L Final  . Glucose, Bld 06/06/2013 154* 70 - 99 mg/dL Final  . BUN 06/06/2013 15  6 - 23 mg/dL Final  . Creatinine, Ser 06/06/2013 0.66  0.50 - 1.10 mg/dL Final  . Calcium 06/06/2013 8.5  8.4 - 10.5 mg/dL Final  . GFR calc non Af Amer 06/06/2013 >90  >90 mL/min Final  . GFR calc Af Amer 06/06/2013 >90  >90 mL/min Final   Comment: (NOTE)                          The eGFR has been calculated using the CKD EPI equation.                          This calculation has not been validated in all clinical situations.                           eGFR's persistently <90 mL/min signify possible Chronic Kidney                          Disease.  Marland Kitchen Prothrombin Time 06/06/2013 13.4  11.6 - 15.2 seconds Final  . INR 06/06/2013 1.04  0.00 - 1.49 Final  . WBC 06/07/2013 12.5* 4.0 - 10.5 K/uL Final  . RBC 06/07/2013 2.28* 3.87 - 5.11 MIL/uL Final  . Hemoglobin 06/07/2013 7.1* 12.0 - 15.0 g/dL Final  . HCT 06/07/2013 21.9* 36.0 - 46.0 % Final  . MCV 06/07/2013 96.1  78.0 - 100.0 fL Final  . MCH 06/07/2013 31.1  26.0 - 34.0 pg Final  . MCHC 06/07/2013 32.4  30.0 - 36.0 g/dL Final  . RDW 06/07/2013 14.4  11.5 - 15.5 % Final  . Platelets 06/07/2013 190  150 - 400 K/uL Final  . Sodium 06/07/2013 141  137 - 147 mEq/L Final  . Potassium 06/07/2013 4.2  3.7 - 5.3 mEq/L Final  . Chloride 06/07/2013 104  96 - 112 mEq/L Final  . CO2 06/07/2013 27  19 - 32 mEq/L Final  . Glucose, Bld 06/07/2013 124* 70 - 99 mg/dL Final  . BUN 06/07/2013 16  6 - 23 mg/dL Final  . Creatinine, Ser 06/07/2013 0.66  0.50 - 1.10 mg/dL Final  . Calcium 06/07/2013 8.5  8.4 - 10.5 mg/dL Final  . GFR calc non Af Amer 06/07/2013 >90  >90 mL/min Final  . GFR calc Af Amer 06/07/2013 >90  >90 mL/min Final   Comment: (NOTE)                          The eGFR has been calculated using the CKD EPI equation.                          This calculation has not been validated in all clinical situations.                          eGFR's persistently <90 mL/min signify possible Chronic Kidney                          Disease.  Marland Kitchen  Prothrombin Time 06/07/2013 13.3  11.6 - 15.2 seconds Final  . INR 06/07/2013 1.03  0.00 - 1.49 Final  . Order Confirmation 06/07/2013 ORDER PROCESSED BY BLOOD BANK   Final  . WBC 06/08/2013 11.4* 4.0 - 10.5 K/uL Final  . RBC 06/08/2013 3.02* 3.87 - 5.11 MIL/uL Final  . Hemoglobin 06/08/2013 9.2* 12.0 - 15.0 g/dL Final   Comment: DELTA CHECK NOTED                          POST TRANSFUSION SPECIMEN  . HCT 06/08/2013 27.6* 36.0 - 46.0 % Final  . MCV  06/08/2013 91.4  78.0 - 100.0 fL Final  . MCH 06/08/2013 30.5  26.0 - 34.0 pg Final  . MCHC 06/08/2013 33.3  30.0 - 36.0 g/dL Final  . RDW 06/08/2013 16.4* 11.5 - 15.5 % Final  . Platelets 06/08/2013 188  150 - 400 K/uL Final  . Prothrombin Time 06/08/2013 16.5* 11.6 - 15.2 seconds Final  . INR 06/08/2013 1.37  0.00 - 1.49 Final  . Prothrombin Time 06/09/2013 19.3* 11.6 - 15.2 seconds Final  . INR 06/09/2013 1.68* 0.00 - 1.49 Final  Hospital Outpatient Visit on 05/27/2013  Component Date Value Ref Range Status  . Color, Urine 05/27/2013 YELLOW  YELLOW Final  . APPearance 05/27/2013 CLEAR  CLEAR Final  . Specific Gravity, Urine 05/27/2013 1.025  1.005 - 1.030 Final  . pH 05/27/2013 5.5  5.0 - 8.0 Final  . Glucose, UA 05/27/2013 NEGATIVE  NEGATIVE mg/dL Final  . Hgb urine dipstick 05/27/2013 NEGATIVE  NEGATIVE Final  . Bilirubin Urine 05/27/2013 NEGATIVE  NEGATIVE Final  . Ketones, ur 05/27/2013 NEGATIVE  NEGATIVE mg/dL Final  . Protein, ur 05/27/2013 NEGATIVE  NEGATIVE mg/dL Final  . Urobilinogen, UA 05/27/2013 0.2  0.0 - 1.0 mg/dL Final  . Nitrite 05/27/2013 NEGATIVE  NEGATIVE Final  . Leukocytes, UA 05/27/2013 NEGATIVE  NEGATIVE Final   MICROSCOPIC NOT DONE ON URINES WITH NEGATIVE PROTEIN, BLOOD, LEUKOCYTES, NITRITE, OR GLUCOSE <1000 mg/dL.  Marland Kitchen MRSA, PCR 05/27/2013 NEGATIVE  NEGATIVE Final  . Staphylococcus aureus 05/27/2013 NEGATIVE  NEGATIVE Final   Comment:                                 The Xpert SA Assay (FDA                          approved for NASAL specimens                          in patients over 35 years of age),                          is one component of                          a comprehensive surveillance                          program.  Test performance has                          been validated by [employer] for patients greater                          than or equal to 97 year old.                          It is not intended                           to diagnose infection nor to                          guide or monitor treatment.  Marland Kitchen aPTT 05/27/2013 28  24 - 37 seconds Final  . WBC 05/27/2013 5.8  4.0 - 10.5 K/uL Final  . RBC 05/27/2013 3.51* 3.87 - 5.11 MIL/uL Final  . Hemoglobin 05/27/2013 10.9* 12.0 - 15.0 g/dL Final  . HCT 05/27/2013 32.9* 36.0 - 46.0 % Final  . MCV 05/27/2013 93.7  78.0 - 100.0 fL Final  . MCH 05/27/2013 31.1  26.0 - 34.0 pg Final  . MCHC 05/27/2013 33.1  30.0 - 36.0 g/dL Final  . RDW 05/27/2013 13.9  11.5 - 15.5 % Final  . Platelets 05/27/2013 235  150 - 400 K/uL Final  . Sodium 05/27/2013 142  137 - 147 mEq/L Final  . Potassium 05/27/2013 4.4  3.7 - 5.3 mEq/L Final  . Chloride 05/27/2013 104  96 - 112 mEq/L Final  . CO2 05/27/2013 28  19 - 32 mEq/L Final  . Glucose, Bld 05/27/2013 99  70 - 99 mg/dL Final  . BUN 05/27/2013 19  6 - 23 mg/dL Final  . Creatinine, Ser 05/27/2013 0.73  0.50 - 1.10 mg/dL Final  . Calcium 05/27/2013 9.2  8.4 - 10.5 mg/dL Final  . Total Protein 05/27/2013 6.7  6.0 - 8.3 g/dL Final  . Albumin 05/27/2013 3.9  3.5 - 5.2 g/dL Final  . AST 05/27/2013 18  0 - 37 U/L Final  . ALT 05/27/2013 18  0 - 35 U/L Final  . Alkaline Phosphatase 05/27/2013 84  39 - 117 U/L Final  . Total Bilirubin 05/27/2013 <0.2* 0.3 - 1.2 mg/dL Final  . GFR calc non Af Amer 05/27/2013 >90  >90 mL/min Final  . GFR calc Af Amer 05/27/2013 >90  >90 mL/min Final   Comment: (NOTE)                          The eGFR has been calculated using the CKD EPI equation.                          This calculation has not been validated in all clinical situations.                          eGFR's persistently <90 mL/min signify possible Chronic Kidney                          Disease.  Marland Kitchen Prothrombin Time 05/27/2013 13.1  11.6 - 15.2 seconds Final  . INR 05/27/2013 1.01  0.00 - 1.49 Final     X-Rays:Dg Chest 2 View  05/27/2013   CLINICAL DATA:  Preop for bilateral knee replacement ; history of previous  tobacco use  EXAM: CHEST  2 VIEW  COMPARISON:  DG CHEST 2 VIEW dated 08/19/2003  FINDINGS: The heart size and mediastinal contours are within normal limits. Both lungs are clear. There is tortuosity of the descending thoracic aorta. Mild degenerative disc change is present at multiple thoracic levels.  IMPRESSION: No active cardiopulmonary disease.   Electronically Signed   By: David  Martinique   On: 05/27/2013 16:56    EKG: Orders placed during the hospital encounter of 05/27/13  . EKG 12-LEAD  . EKG 12-LEAD     Hospital Course: Patient was admitted to [facility] and taken to the OR and underwent the above stated procedure well without complications.  Patient tolerated the procedure well and was later transferred to the recovery room and then to the orthopaedic floor for postoperative care. Anesthesia was consulted postoperatively to place an epidural in for postoperative pain management. The patient was also given PO and IV analgesics for pain control following their surgery.  They were given 24 hours of postoperative antibiotics and started on DVT prophylaxis in the form of Lovenox and Coumadin after the epidural had been removed.   PT and OT were ordered for total joint protocol.  Discharge planning consulted to help with postop disposition and equipment needs.  Patient had a good night on the evening of surgery and started to get up OOB with therapy on day one. Epidural was working well. Hemovac drains were pulled without difficulty on day one.  Continued to work with therapy into day two.  Dressings were changed on day two and both incisions were healing well.  The epidural was removed without difficulty by Anesthesia on day two.  Lovenox was started 12 hours after the epidural was removed.  She was noted to have a drop in He HGB to 7.1 so she was given two units of blood.  By day three, the patient started to show progress with therapy.  She was feeling much better following the transfusion.   They continued to receive therapy each day for continued total knee protocol.  The incisions were healing well.  They continued to progress on day four at which time the patient was seen in rounds and was ready to CIR.  At the time of this summary, the staff was waiting on insurance approval for rehab.  This summary was prepared in preparation of transfer to CIR if insurance approval comes in.  Take Coumadin for 4 weeks and then discontinue.  The dose may need to be adjusted based upon the INR.  Please follow the INR and titrate Coumadin dose for a therapeutic range between 2.0 and 3.0 INR.  After completing the 4 weeks of Coumadin, the patient should take an 81 mg Aspirin daily for four more weeks.  Continue Lovenox injections until the INR is therapeutic at or greater than 2.0.  When INR reaches the therapeutic level of equal to or greater than 2.0, the patient may discontinue the Lovenox injections.   Diet: Cardiac diet Activity:WBAT to both legs Follow-up:in 2 weeks Disposition - [facility] Discharged Condition: good at the time of this summary.  Awaiting insurance approval.   Discharge Instructions   CPM    Complete by:  As directed   Continuous passive motion machine (CPM):      Use the CPM from Zero to 40 degreess for 3-4 hours per day per leg.      You may increase by 5-10 degress per day.  Break it up into 2 or 3 sessions per day.  Use CPM for 2-3 weeks or until you are told to stop.     Call MD / Call 911    Complete by:  As directed   If you experience chest pain or shortness of breath, CALL 911 and be transported to the hospital emergency room.  If you develope a fever above 101 F, pus (white drainage) or increased drainage or redness at the wound, or calf pain, call your surgeon's office.     Change dressing    Complete by:  As directed   Change dressing daily with sterile 4 x 4 inch gauze dressing and apply TED hose. Do not submerge the incision under water.      Constipation Prevention    Complete by:  As directed   Drink plenty of fluids.  Prune juice may be helpful.  You may use a stool softener, such as Colace (over the counter) 100 mg twice a day.  Use MiraLax (over the counter) for constipation as needed.     Diet general    Complete by:  As directed      Discharge instructions    Complete by:  As directed   Pick up stool softner and laxative for home. Do not submerge incision under water. May shower. Continue to use ice for pain and swelling from surgery.  Take Coumadin for four weeks and then discontinue.  The dose may need to be adjusted based upon the INR.  Please follow the INR and titrate Coumadin dose for a therapeutic range between 2.0 and 3.0 INR.  After completing the four weeks of Coumadin, the patient should take an 81 mg Aspirin daily for four additional weeks.  Lovenox injections twice a day.  When INR reaches the therapeutic level of equal to or greater than 2.0, the patient may discontinue the Lovenox injections.     Do not put a pillow under the knee. Place it under the heel.    Complete by:  As directed      Do not sit on low chairs, stoools or toilet seats, as it may be difficult to get up from low surfaces    Complete by:  As directed      Driving restrictions    Complete by:  As directed   No driving until released by the physician.     Increase activity slowly as tolerated    Complete by:  As directed      Lifting restrictions    Complete by:  As directed   No lifting until released by the physician.     Patient may shower    Complete by:  As directed   You may shower without a dressing once there is no drainage.  Do not wash over the wound.  If drainage remains, do not shower until drainage stops.     TED hose    Complete by:  As directed   Use stockings (TED hose) for 3 weeks on both leg(s).  You may remove them at night for sleeping.     Weight bearing as tolerated    Complete by:  As directed               Medication List    STOP taking these medications       ALEVE 220 MG Caps  Generic drug:  Naproxen Sodium     diclofenac sodium 1 % Gel  Commonly known as:  VOLTAREN     estradiol 0.1 MG/GM vaginal cream  Commonly known  as:  ESTRACE     EVENING PRIMROSE OIL PO     multivitamin with minerals Tabs tablet     TURMERIC PO     vitamin C 500 MG tablet  Commonly known as:  ASCORBIC ACID     Vitamin D-3 5000 UNITS Tabs      TAKE these medications       acetaminophen 325 MG tablet  Commonly known as:  TYLENOL  Take 2 tablets (650 mg total) by mouth every 6 (six) hours as needed for mild pain (or Fever >/= 101).     alum & mag hydroxide-simeth 200-200-20 MG/5ML suspension  Commonly known as:  MAALOX/MYLANTA  Take 30 mLs by mouth every 8 (eight) hours as needed for indigestion or heartburn.     alum hydroxide-mag trisilicate 83-09 MG Chew chewable tablet  Commonly known as:  GAVISCON  Chew 1-2 tablets by mouth 4 (four) times daily as needed for indigestion or heartburn.     bisacodyl 10 MG suppository  Commonly known as:  DULCOLAX  Place 1 suppository (10 mg total) rectally daily as needed for moderate constipation.     COMBIVENT RESPIMAT 20-100 MCG/ACT Aers respimat  Generic drug:  Ipratropium-Albuterol  Inhale 1 puff into the lungs daily.     diphenhydrAMINE 12.5 MG/5ML elixir  Commonly known as:  BENADRYL  Take 5-10 mLs (12.5-25 mg total) by mouth every 4 (four) hours as needed for itching.     DSS 100 MG Caps  Take 100 mg by mouth 2 (two) times daily.     enoxaparin 30 MG/0.3ML injection  Commonly known as:  LOVENOX  Inject 0.3 mLs (30 mg total) into the skin every 12 (twelve) hours. Continue Lovenox injections until the INR is therapeutic at or greater than 2.0.  When INR reaches the therapeutic level of equal to or greater than 2.0, the patient may discontinue the Lovenox injections.     fluticasone 50 MCG/ACT nasal spray  Commonly known as:  FLONASE  Place 1  spray into both nostrils daily as needed for allergies or rhinitis.     loratadine 10 MG tablet  Commonly known as:  CLARITIN  Take 10 mg by mouth daily.     MAGNESIUM PO  Take 1 tablet by mouth daily.     methocarbamol 500 MG tablet  Commonly known as:  ROBAXIN  Take 1 tablet (500 mg total) by mouth every 6 (six) hours as needed for muscle spasms.     metoCLOPramide 5 MG tablet  Commonly known as:  REGLAN  Take 1-2 tablets (5-10 mg total) by mouth every 8 (eight) hours as needed for nausea (if ondansetron (ZOFRAN) ineffective.).     montelukast 10 MG tablet  Commonly known as:  SINGULAIR  Take 10 mg by mouth daily.     nystatin 100000 UNIT/GM Powd  Apply topically 2 (two) times daily. Under breasts     nystatin 100000 UNIT/ML suspension  Commonly known as:  MYCOSTATIN  Take 5 mLs by mouth daily.     omeprazole 40 MG capsule  Commonly known as:  PRILOSEC  Take 40 mg by mouth daily.     ondansetron 4 MG tablet  Commonly known as:  ZOFRAN  Take 1 tablet (4 mg total) by mouth every 6 (six) hours as needed for nausea.     oxyCODONE 5 MG immediate release tablet  Commonly known as:  Oxy IR/ROXICODONE  Take 1-2 tablets (5-10 mg total) by mouth every 3 (three) hours as needed  for moderate pain, severe pain or breakthrough pain.     polyethylene glycol packet  Commonly known as:  MIRALAX / GLYCOLAX  Take 17 g by mouth daily as needed for mild constipation.     POTASSIUM PO  Take 1 tablet by mouth daily.     psyllium 95 % Pack  Commonly known as:  HYDROCIL/METAMUCIL  Take 1 packet by mouth daily.     SYSTANE BALANCE 0.6 % Soln  Generic drug:  Propylene Glycol  Apply 1 drop to eye as needed (Dry eyes).     traMADol 50 MG tablet  Commonly known as:  ULTRAM  Take 1-2 tablets (50-100 mg total) by mouth every 6 (six) hours as needed (mild to moderate pain).     valsartan 80 MG tablet  Commonly known as:  DIOVAN  Take 80 mg by mouth every morning.     warfarin 7.5 MG  tablet  Commonly known as:  COUMADIN  Take 1 tablet (7.5 mg total) by mouth one time only at 6 PM. Take Coumadin for a total of four weeks.  The dose may need to be adjusted based upon the INR.  Please follow the INR and titrate Coumadin dose for a therapeutic range between 2.0 and 3.0 INR.  After completing the four weeks of Coumadin, the patient should take an 81 mg Aspirin daily for four more weeks.           Follow-up Information   Follow up with Gearlean Alf, MD. Schedule an appointment as soon as possible for a visit in 2 weeks.   Specialty:  Orthopedic Surgery   Contact information:   [address] [place] [address] [phone]       Signed: Arlee Muslim, PA-C Orthopaedic Surgery 06/09/2013, 8:52 AM

## 2013-06-09 NOTE — Progress Notes (Signed)
Inpatient Rehabilitation  I have notified pt. and Sarah RN that pt. will be admitted to [facility] rehab this afternoon.  Room number will be 4W02 for [facility] transport.  Please call if questions.  [place] Admissions Coordinator Cell [phone] Office [phone]

## 2013-06-09 NOTE — Discharge Instructions (Signed)
° °Dr. Frank Aluisio °Total Joint Specialist °[facility] °[address] °[place], [place] [address] °[phone] ° °TOTAL KNEE REPLACEMENT POSTOPERATIVE DIRECTIONS ° ° ° °Knee Rehabilitation, Guidelines Following Surgery  °Results after knee surgery are often greatly improved when you follow the exercise, range of motion and muscle strengthening exercises prescribed by your doctor. Safety measures are also important to protect the knee from further injury. Any time any of these exercises cause you to have increased pain or swelling in your knee joint, decrease the amount until you are comfortable again and slowly increase them. If you have problems or questions, call your caregiver or physical therapist for advice.  ° °HOME CARE INSTRUCTIONS  °Remove items at home which could result in a fall. This includes throw rugs or furniture in walking pathways.  °Continue medications as instructed at time of discharge. °You may have some home medications which will be placed on hold until you complete the course of blood thinner medication.  °You may start showering once you are discharged home but do not submerge the incision under water. Just pat the incision dry and apply a dry gauze dressing on daily. °Walk with walker as instructed.  °You may resume a sexual relationship in one month or when given the OK by  your doctor.  °· Use walker as long as suggested by your caregivers. °· Avoid periods of inactivity such as sitting longer than an hour when not asleep. This helps prevent blood clots.  °You may put full weight on your legs and walk as much as is comfortable.  °You may return to work once you are cleared by your doctor.  °Do not drive a car for 6 weeks or until released by you surgeon.  °· Do not drive while taking narcotics.  °Wear the elastic stockings for three weeks following surgery during the day but you may remove then at night. °Make sure you keep all of your appointments after your  operation with all of your doctors and caregivers. You should call the office at the above phone number and make an appointment for approximately two weeks after the date of your surgery. °Change the dressing daily and reapply a dry dressing each time. °Please pick up a stool softener and laxative for home use as long as you are requiring pain medications. °· Continue to use ice on the knee for pain and swelling from surgery. You may notice swelling that will progress down to the foot and ankle.  This is normal after surgery.  Elevate the leg when you are not up walking on it.   °It is important for you to complete the blood thinner medication as prescribed by your doctor. °· Continue to use the breathing machine which will help keep your temperature down.  It is common for your temperature to cycle up and down following surgery, especially at night when you are not up moving around and exerting yourself.  The breathing machine keeps your lungs expanded and your temperature down. ° °RANGE OF MOTION AND STRENGTHENING EXERCISES  °Rehabilitation of the knee is important following a knee injury or an operation. After just a few days of immobilization, the muscles of the thigh which control the knee become weakened and shrink (atrophy). Knee exercises are designed to build up the tone and strength of the thigh muscles and to improve knee motion. Often times heat used for twenty to thirty minutes before working out will loosen up your tissues and help with improving the   range of motion but do not use heat for the first two weeks following surgery. These exercises can be done on a training (exercise) mat, on the floor, on a table or on a bed. Use what ever works the best and is most comfortable for you Knee exercises include:  Leg Lifts - While your knee is still immobilized in a splint or cast, you can do straight leg raises. Lift the leg to 60 degrees, hold for 3 sec, and slowly lower the leg. Repeat 10-20 times 2-3  times daily. Perform this exercise against resistance later as your knee gets better.  Quad and Hamstring Sets - Tighten up the muscle on the front of the thigh (Quad) and hold for 5-10 sec. Repeat this 10-20 times hourly. Hamstring sets are done by pushing the foot backward against an object and holding for 5-10 sec. Repeat as with quad sets.  A rehabilitation program following serious knee injuries can speed recovery and prevent re-injury in the future due to weakened muscles. Contact your doctor or a physical therapist for more information on knee rehabilitation.   SKILLED REHAB INSTRUCTIONS: If the patient is transferred to a skilled rehab facility following release from the hospital, a list of the current medications will be sent to the facility for the patient to continue.  When discharged from the skilled rehab facility, please have the facility set up the patient's [place] prior to being released. Also, the skilled facility will be responsible for providing the patient with their medications at time of release from the facility to include their pain medication, the muscle relaxants, and their blood thinner medication. If the patient is still at the rehab facility at time of the two week follow up appointment, the skilled rehab facility will also need to assist the patient in arranging follow up appointment in our office and any transportation needs.  MAKE SURE YOU:  Understand these instructions.  Will watch your condition.  Will get help right away if you are not doing well or get worse.    Pick up stool softner and laxative for home. Do not submerge incision under water. May shower. Continue to use ice for pain and swelling from surgery.  Take Coumadin for four weeks and then discontinue.  The dose may need to be adjusted based upon the INR.  Please follow the INR and titrate Coumadin dose for a therapeutic range between 2.0 and 3.0 INR.  After completing the four  weeks of Coumadin, the patient should take an 81 mg Aspirin daily for four additional weeks.  Lovenox injections twice a day.  When INR reaches the therapeutic level of equal to or greater than 2.0, the patient may discontinue the Lovenox injections.

## 2013-06-10 ENCOUNTER — Inpatient Hospital Stay (HOSPITAL_COMMUNITY): Payer: 59 | Admitting: Occupational Therapy

## 2013-06-10 ENCOUNTER — Inpatient Hospital Stay (HOSPITAL_COMMUNITY): Payer: 59

## 2013-06-10 DIAGNOSIS — D62 Acute posthemorrhagic anemia: Secondary | ICD-10-CM

## 2013-06-10 DIAGNOSIS — Z96659 Presence of unspecified artificial knee joint: Secondary | ICD-10-CM

## 2013-06-10 DIAGNOSIS — IMO0002 Reserved for concepts with insufficient information to code with codable children: Secondary | ICD-10-CM

## 2013-06-10 DIAGNOSIS — M1710 Unilateral primary osteoarthritis, unspecified knee: Secondary | ICD-10-CM

## 2013-06-10 LAB — COMPREHENSIVE METABOLIC PANEL
ALBUMIN: 2.5 g/dL — AB (ref 3.5–5.2)
ALK PHOS: 74 U/L (ref 39–117)
ALT: 15 U/L (ref 0–35)
AST: 20 U/L (ref 0–37)
BUN: 15 mg/dL (ref 6–23)
CALCIUM: 8.4 mg/dL (ref 8.4–10.5)
CO2: 29 mEq/L (ref 19–32)
Chloride: 97 mEq/L (ref 96–112)
Creatinine, Ser: 0.66 mg/dL (ref 0.50–1.10)
GFR calc Af Amer: >90 mL/min (ref >90)
GFR calc non Af Amer: >90 mL/min (ref >90)
Glucose, Bld: 117 mg/dL — ABNORMAL HIGH (ref 70–99)
POTASSIUM: 4.2 meq/L (ref 3.7–5.3)
SODIUM: 137 meq/L (ref 137–147)
TOTAL PROTEIN: 5.8 g/dL — AB (ref 6.0–8.3)
Total Bilirubin: 0.4 mg/dL (ref 0.3–1.2)

## 2013-06-10 LAB — CBC WITH DIFFERENTIAL/PLATELET
BASOS PCT: 0 % (ref 0–1)
Basophils Absolute: 0 10*3/uL (ref 0.0–0.1)
EOS ABS: 0.3 10*3/uL (ref 0.0–0.7)
EOS PCT: 4 % (ref 0–5)
HCT: 25.1 % — ABNORMAL LOW (ref 36.0–46.0)
Hemoglobin: 8.4 g/dL — ABNORMAL LOW (ref 12.0–15.0)
Lymphocytes Relative: 21 % (ref 12–46)
Lymphs Abs: 1.5 10*3/uL (ref 0.7–4.0)
MCH: 31.1 pg (ref 26.0–34.0)
MCHC: 33.5 g/dL (ref 30.0–36.0)
MCV: 93 fL (ref 78.0–100.0)
Monocytes Absolute: 0.8 10*3/uL (ref 0.1–1.0)
Monocytes Relative: 11 % (ref 3–12)
Neutro Abs: 4.7 10*3/uL (ref 1.7–7.7)
Neutrophils Relative %: 64 % (ref 43–77)
PLATELETS: 241 10*3/uL (ref 150–400)
RBC: 2.7 MIL/uL — ABNORMAL LOW (ref 3.87–5.11)
RDW: 15.1 % (ref 11.5–15.5)
WBC: 7.3 10*3/uL (ref 4.0–10.5)

## 2013-06-10 LAB — PROTIME-INR
INR: 1.79 — ABNORMAL HIGH (ref 0.00–1.49)
PROTHROMBIN TIME: 20.3 s — AB (ref 11.6–15.2)

## 2013-06-10 LAB — URINALYSIS, ROUTINE W REFLEX MICROSCOPIC
Glucose, UA: NEGATIVE mg/dL
Hgb urine dipstick: NEGATIVE
KETONES UR: 15 mg/dL — AB
Leukocytes, UA: NEGATIVE
Nitrite: NEGATIVE
PROTEIN: NEGATIVE mg/dL
Specific Gravity, Urine: 1.026 (ref 1.005–1.030)
Urobilinogen, UA: 1 mg/dL (ref 0.0–1.0)
pH: 6 (ref 5.0–8.0)

## 2013-06-10 MED ORDER — SORBITOL 70 % SOLN
45.0000 mL | Freq: Once | Status: AC
  Administered 2013-06-10: 45 mL via ORAL

## 2013-06-10 MED ORDER — BISACODYL 10 MG RE SUPP
10.0000 mg | Freq: Once | RECTAL | Status: AC
  Administered 2013-06-10: 10 mg via RECTAL
  Filled 2013-06-10: qty 1

## 2013-06-10 MED ORDER — WARFARIN SODIUM 7.5 MG PO TABS
7.5000 mg | ORAL_TABLET | Freq: Once | ORAL | Status: AC
  Administered 2013-06-10: 7.5 mg via ORAL
  Filled 2013-06-10: qty 1

## 2013-06-10 MED ORDER — FLEET ENEMA 7-19 GM/118ML RE ENEM: 1.0000 | ENEMA | Freq: Every day | RECTAL | Status: DC | PRN

## 2013-06-10 NOTE — Progress Notes (Signed)
O2 saturation found to be 79% on RA during routine vitals. Placed on 2L O2 per order and sats increased to 92%. Ortho tech in process of placing pt on CPM at this time. Continue to monitor. Hortencia Conradi RN

## 2013-06-10 NOTE — Progress Notes (Signed)
Patient information reviewed and entered into eRehab system by Saranya Harlin, RN, CRRN, PPS Coordinator.  Information including medical coding and functional independence measure will be reviewed and updated through discharge.    

## 2013-06-10 NOTE — Evaluation (Signed)
Occupational Therapy Assessment and Plan & Session Notes  Patient Details  Name: ALIEAH BRINTON MRN: [id number] Date of Birth: [birth year]  OT Diagnosis: acute pain and muscle weakness (generalized) Rehab Potential: Rehab Potential: Good ELOS: 7-10 days   Today's Date: 06/10/2013  Problem List:  Patient Active Problem List   Diagnosis Date Noted  . Postoperative anemia due to acute blood loss 06/09/2013  . Transfusion history 06/09/2013  . OA (osteoarthritis) of knee 06/05/2013  . HIATAL HERNIA 06/18/2007  . FATTY LIVER DISEASE 06/18/2007    Past Medical History:  Past Medical History  Diagnosis Date  . Allergy   . Arthritis   . Substance abuse   . Anemia   . Hypertension   . H/O bronchitis   . Pneumonia     hx of 10 years ago  . Depression     hx of  . History of kidney stones     x3  . Complication of anesthesia     "during breast surgery: could hear the surgery, wake up too soon, anxious   Past Surgical History:  Past Surgical History  Procedure Laterality Date  . Cosmetic surgery  2013    in the office  . Cesarean section  1989, 1992  . Breast surgery  1985    abscess  . Nasal sinus surgery  2000  . Total knee arthroplasty Bilateral 06/05/2013    Procedure: BILATERAL TOTAL KNEE ARTHROPLASTY;  Surgeon: Gearlean Alf, MD;  Location: [facility];  Service: Orthopedics;  Laterality: Bilateral;    Clinical Impression: CATHREN SWEEN is a 63 y.o. 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 Patient elected to undergo B-TKR on 06/05/13 By Dr. Wynelle Link. Post op WBAT and on lovenox/coumadin bridge for DVT prophylaxis. Is WBAT with CPM to assist with ROM. ABLA with hgb 7.1 treated with 2 units PRBC. Therapies initiated and patient limited by fatigue, anxiety as well as dizziness question orthostasis. Therapies initiated and CIR recommended by Rehab team and MD therefore patient admitted today. Patient  transferred to CIR on 06/09/2013 .    Patient currently requires min>total assist with basic self-care skills and IADL secondary to muscle weakness and muscle joint tightness, decreased cardiorespiratoy endurance, increased anxiety and decreased sitting balance, decreased standing balance, decreased postural control and decreased balance strategies.  Prior to hospitalization, patient could complete ADLs at mod I level. Patient states she was occasionally using a rollator for indoor and a cane for community.   Patient will benefit from skilled intervention to increase independence with basic self-care skills prior to discharge home with care partner.  Anticipate patient will require intermittent supervision and additional occupational therapy is TBD.   OT - End of Session Activity Tolerance: Tolerates 10 - 20 min activity with multiple rests Endurance Deficit: Yes Endurance Deficit Description: Patient with decreased cardiovascular endurance, 02 sats on eval =89-94%. Sats increased with education & cueing for pursed lip breathing OT Assessment Rehab Potential: Good Barriers to Discharge:  (none known at this time) OT Patient demonstrates impairments in the following area(s): Balance;Edema;Endurance;Motor;Pain;Perception;Safety;Sensory;Skin Integrity OT Basic ADL's Functional Problem(s): Grooming;Bathing;Dressing;Toileting OT Advanced ADL's Functional Problem(s): Simple Meal Preparation OT Transfers Functional Problem(s): Toilet;Tub/Shower OT Additional Impairment(s): None OT Plan OT Intensity: Minimum of 1-2 x/day, 45 to 90 minutes OT Frequency: 5 out of 7 days OT Duration/Estimated Length of Stay: 7-10 days OT Treatment/Interventions: Balance/vestibular training;Community reintegration;Discharge planning;DME/adaptive equipment instruction;Functional mobility training;Pain management;Patient/family education;Psychosocial support;Self Care/advanced ADL  retraining;Skin care/wound  managment;Splinting/orthotics;Therapeutic Activities;Therapeutic Exercise;UE/LE Strength taining/ROM;UE/LE Coordination activities;Wheelchair propulsion/positioning OT Self Feeding Anticipated Outcome(s): independent OT Basic Self-Care Anticipated Outcome(s): mod I OT Toileting Anticipated Outcome(s): mod I OT Bathroom Transfers Anticipated Outcome(s): mod I OT Recommendation Patient destination: Home Follow Up Recommendations:  (OT: TBD) Equipment Recommended: 3 in 1 bedside comode;Tub/shower bench;Tub/shower seat Equipment Details: shower seat vs bench  Precautions/Restrictions  Precautions Precautions: Fall Precaution Comments: WBAT bilaterally Required Braces or Orthoses: Knee Immobilizer - Right Knee Immobilizer - Right: Discontinue once straight leg raise with < 10 degree lag Restrictions Weight Bearing Restrictions: No  General Chart Reviewed: Yes Family/Caregiver Present: No  Vital Signs Therapy Vitals Temp: 99 F (37.2 C) Temp src: Oral Pulse Rate: 91 Patient Position (if appropriate): Orthostatic Vitals Oxygen Therapy SpO2: 93 % O2 Device: Nasal cannula O2 Flow Rate (L/min): 2 L/min  Pain Pain Assessment Pain Assessment: No/denies pain Pain Score: 7  Pain Type: Surgical pain Pain Location: Knee Pain Orientation: Right;Left Pain Descriptors / Indicators: Throbbing Pain Frequency: Constant Pain Onset: Gradual Pain Intervention(s): RN made aware;Repositioned Multiple Pain Sites: No  Home Living/Prior Functioning Home Living Available Help at Discharge: Family;Available 24 hours/day;Other (Comment) (Husband and daughter (from [place]) will be staying with patient for a week) Type of Home: House Home Access: Stairs to enter Entrance Stairs-Number of Steps: 2 stairs Entrance Stairs-Rails: None Home Layout: Two level;Able to live on main level with bedroom/bathroom  Lives With: Spouse IADL History Homemaking Responsibilities: No (patient reports she has  hired help for IADLs) Current License: Yes 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 Function Level of Independence: Independent with basic ADLs;Independent with gait;Requires assistive device for independence Driving: Yes Comments: used cane for outside and rollator inside  ADL - See FIM  Vision/Perception  Vision- History Baseline Vision/History: Wears glasses Wears Glasses: At all times Patient Visual Report: No change from baseline Vision- Assessment Vision Assessment?: No apparent visual deficits   Cognition Overall Cognitive Status: Within Functional Limits for tasks assessed Orientation Level: Oriented X4 Memory: Appears intact Awareness: Appears intact Problem Solving: Appears intact Safety/Judgment: Appears intact Comments: increased anxiety during OT evaluation secondary to pain & discomfort?  Sensation Sensation Additional Comments: BUEs appear intact Coordination Gross Motor Movements are Fluid and Coordinated: Yes (BUEs appear intact) Fine Motor Movements are Fluid and Coordinated: Yes (BUEs appear intact)  Motor  Motor Motor: Within Functional Limits  Trunk/Postural Assessment  Cervical Assessment Cervical Assessment: Within Functional Limits Thoracic Assessment Thoracic Assessment: Exceptions to [facility] (slightly flexed posture) Lumbar Assessment Lumbar Assessment: Exceptions to [facility] (posterior tilt; body habitus limiting assessment) Postural Control Postural Control: Within Functional Limits   Balance Balance Balance Assessed: Yes Static Sitting Balance Static Sitting - Level of Assistance: 5: Stand by assistance Dynamic Sitting Balance Dynamic Sitting - Level of Assistance: 5: Stand by assistance Static Standing Balance Static Standing - Level of Assistance: 5: Stand by assistance (with RW) Dynamic Standing Balance Dynamic Standing - Level of Assistance: 4: Min assist (with RW)  Extremity/Trunk  Assessment RUE Assessment RUE Assessment: Within Functional Limits (can benefit from BUE strengthening) LUE Assessment LUE Assessment: Within Functional Limits (can benefit from BUE strengthening)  FIM:  FIM - Eating Eating Activity: 0: Activity did not occur FIM - Grooming Grooming: 0: Activity did not occur FIM - Bathing Bathing: 0: Activity did not occur FIM - Upper Body Dressing/Undressing Upper body dressing/undressing: 1: Total-Patient completed less than 25% of tasks (seated EOB) FIM - Lower Body Dressing/Undressing Lower body  dressing/undressing: 1: Total-Patient completed less than 25% of tasks (sit<>stand EOB) FIM - Toileting Toileting: 1: Total-Patient completed zero steps, helper did all 3 FIM - Radio producer Devices: Nurse, learning disability Transfers: 4-To toilet/BSC: Min A (steadying Pt. > 75%) FIM - Tub/Shower Transfers Tub/shower Transfers: 0-Activity did not occur or was simulated   Refer to Care Plan for Long Term Goals  Recommendations for other services: None, at this time.  Discharge Criteria: Patient will be discharged from OT if patient refuses treatment 3 consecutive times without medical reason, if treatment goals not met, if there is a change in medical status, if patient makes no progress towards goals or if patient is discharged from hospital.  The above assessment, treatment plan, treatment alternatives and goals were discussed and mutually agreed upon: by patient  -----------------------------------------------------------------------------------------------------------------------------  SESSION NOTES  Session #1  [phone] - 60 Minutes Individual Therapy Patient with 7/10 at beginning of session; RN aware. Patient with 5/10 complaints of pain at end of session Initial 1:1 occupational therapy evaluation completed. Focused skilled intervention on bed mobility, sitting balance/tolerance/endurance seated EOB,  UB/LB dressing, sit<>stands, functional mobility/ambulation using RW, toilet transfer, and overall activity tolerance/endurance. Patient with complaints of dizziness when ambulating > BSC:  BP=102/65, 02=89-94, HR=99; therapist encouraged pursed lip breathing when 02 >90% and 02 increased within ~1 minute on room air. Left patient seated on [facility] with PT present for next therapy session.   Session #2 [id number] - 54 Minutes Individual Therapy Patient with 7/10 complaints of pain RN aware Patient received supine in bed. Patient engaged in bed mobility with supervision and sat EOB. Therapist left height of bed at lowest level and patient unable to stand from this level. Therapist raised height of bed ~6 inches and patient able to stand after 3rd attempt with minimal assistance. From here, patient ambulated into bathroom where drop arm BSC was placed over elevated toilet seat; transfer onto toilet with close supervision and off toilet with close supervision. Patient then ambulated into room for w/c transfer, then patient propelled self to sink for grooming task of washing hands, washing face, and brushing teeth.  At end of session, left patient seated in w/c at sink with RN present. During toileting, patient with complaints of not being able to urinate and having to "manipulate" to get urine to come out; RN made aware of this.   Estelle June 06/10/2013, 1:27 PM

## 2013-06-10 NOTE — Progress Notes (Signed)
[facility] PHYSICAL MEDICINE & REHABILITATION     PROGRESS NOTE    Subjective/Complaints: Pt sitting eob with OT. Had an "unpleasant" night. Feels feverish. Didn't move bowels. Feels stiffer A 12 point review of systems has been performed and if not noted above is otherwise negative.   Objective: Vital Signs: Blood pressure 105/65, pulse 91, temperature 99 F (37.2 C), temperature source Oral, resp. rate 18, height 5\' 8"  (1.727 m), weight 99.6 kg (219 lb 9.3 oz), SpO2 93.00%. No results found.  Recent Labs  06/08/13 0533 06/10/13 0510  WBC 11.4* 7.3  HGB 9.2* 8.4*  HCT 27.6* 25.1*  PLT 188 241    Recent Labs  06/10/13 0510  NA 137  K 4.2  CL 97  GLUCOSE 117*  BUN 15  CREATININE 0.66  CALCIUM 8.4   CBG (last 3)  No results found for this basename: GLUCAP,  in the last 72 hours  Wt Readings from Last 3 Encounters:  06/10/13 99.6 kg (219 lb 9.3 oz)  06/05/13 95.255 kg (210 lb)  06/05/13 95.255 kg (210 lb)    Physical Exam:  Constitutional: She appears well-developed.  HENT:  Head: Normocephalic and atraumatic.  Eyes: Conjunctivae are normal. Pupils are equal, round, and reactive to light.  Neck: No JVD present. No tracheal deviation present. No thyromegaly present.  Cardiovascular: Normal rate. No murmur  Respiratory: No respiratory distress.  GI: She exhibits persistent distension. Scarce bowel sounds Musculoskeletal:  edema around either knee. Both knees tender to basic ROM/palpation.  Skin: wounds intact. No drainage or erythema Neurological: alert and oriented x 3. Cognitively appropriate UE's 5/5. KE and HF limited due to knee pain/surgery--grossly 1+ to 2 respectively. Normal ADF/APF bilaterally. No sensory changes. Dtr;s 1+ Psychiatric: She has a normal mood and affect. Her behavior is normal. Thought content normal   Assessment/Plan: 1. Functional deficits secondary to OA of bilateral knees s/p bilateral TKA's  which require 3+ hours per day of  interdisciplinary therapy in a comprehensive inpatient rehab setting. Physiatrist is providing close team supervision and 24 hour management of active medical problems listed below. Physiatrist and rehab team continue to assess barriers to discharge/monitor patient progress toward functional and medical goals. FIM:                   Comprehension Comprehension Mode: Auditory Comprehension: 7-Follows complex conversation/direction: With no assist  Expression Expression Mode: Verbal Expression: 7-Expresses complex ideas: With no assist  Social Interaction Social Interaction: 7-Interacts appropriately with others - No medications needed.  Problem Solving Problem Solving: 6-Solves complex problems: With extra time  Memory Memory: 5-Requires cues to use assistive device Medical Problem List and Plan:  1. 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 DVT Prophylaxis/Anticoagulation: Pharmaceutical: Coumadin and Lovenox till INR therapeutic X 4 weeks followed by ASA 81 mg for 4 weeks.  3. Pain Management: added OxyContin CR 10 mg bid for more consistent pain relief   - Will continue prn oxycodone and advised patient to use ice past therapy sessions.  4. Mood: Team to provide ego support to help with anxiety issues. LCSW to follow for evaluation and support.  5. Neuropsych: This patient is capable of making decisions on her own behalf.  6. ABLA:continue iron supplement. Recheck hgb Friday 7. HTN: Monitor BP every 8 hours. Blood pressures on low side therefore will set parameters on Avapro. Check orthostatic BP/pulse. Encourage/push po fluids.  8. H/o Seasonal allergies: continue Singulair and Claritin. Encourage  IS every hour to help with pulmonary toilet. Wean oxygen as able  9. GERD: Stable on protonix.  10. Constipation: No BM post op. Will schedule Miralax daily and augment program as needed.  -dulcolax suppository/fleets enema today -consider  KUB as she's fairly distended  11. Reactive leucocytosis: resolved  -low grade temp   LOS (Days) 1 A FACE TO FACE EVALUATION WAS PERFORMED  Meredith Staggers 06/10/2013 8:15 AM

## 2013-06-10 NOTE — Progress Notes (Signed)
Per patient, she had to "manipulate" herself in order to void by stimulating the urethra and also putting pressure on her bladder.  Patient voided 500 mL; PVR of 119 mL.  Algis Liming, PA notified; no new orders received.  Will continue to monitor.

## 2013-06-10 NOTE — Progress Notes (Signed)
ANTICOAGULATION CONSULT NOTE - Follow Up Consult  Pharmacy Consult for coumadin Indication: VTE prophylaxis  Allergies  Allergen Reactions  . Morphine Itching    REACTION: rash, IV    Patient Measurements: Height: 5\' 8"  (172.7 cm) Weight: 219 lb 9.3 oz (99.6 kg) IBW/kg (Calculated) : 63.9 Heparin Dosing Weight:   Vital Signs: Temp: 99 F (37.2 C) (06/03 0611) Temp src: Oral (06/03 0611) Pulse Rate: 91 (06/03 0611)  Labs:  Recent Labs  06/08/13 0533 06/09/13 0524 06/10/13 0510 06/10/13 0845  HGB 9.2*  --  8.4*  --   HCT 27.6*  --  25.1*  --   PLT 188  --  241  --   LABPROT 16.5* 19.3*  --  20.3*  INR 1.37 1.68*  --  1.79*  CREATININE  --   --  0.66  --     Estimated Creatinine Clearance: 91.2 ml/min (by C-G formula based on Cr of 0.66).   Medications:  Scheduled:  . bisacodyl  10 mg Rectal Once  . enoxaparin (LOVENOX) injection  30 mg Subcutaneous Q12H  . irbesartan  75 mg Oral Daily  . iron polysaccharides  150 mg Oral q12n4p  . loratadine  10 mg Oral Daily  . montelukast  10 mg Oral Daily  . nystatin  5 mL Oral Daily  . nystatin   Topical BID  . OxyCODONE  10 mg Oral Q12H  . pantoprazole  80 mg Oral Daily  . polyethylene glycol  17 g Oral Daily  . warfarin  7.5 mg Oral ONCE-1800  . Warfarin - Pharmacist Dosing Inpatient   Does not apply q1800   Infusions:    Assessment: 63 yo female s/p orth surgery is currently on subtherapeutic coumadin.  INR today is 1.79.  Also on lovenox 30 mg sq q12h Goal of Therapy:  INR 2-3 Monitor platelets by anticoagulation protocol: Yes   Plan:  1) Coumadin 7.5mg  po x1.  2) Continue lovenox 30mg  sq q12h until INR is >/= 2  Tsz-Yin Margaret Staggs 06/10/2013,9:18 AM

## 2013-06-10 NOTE — Evaluation (Signed)
Physical Therapy Assessment and Plan  Patient Details  Name: Sharon Daniels MRN: [id number] Date of Birth: [birth year]  PT Diagnosis: Abnormality of gait, Difficulty walking, Edema, Muscle weakness and Pain in joint Rehab Potential: Good ELOS: 7-10 days   Today's Date: 06/10/2013 Time: 9622-2979 Time Calculation (min): 60 min  Problem List:  Patient Active Problem List   Diagnosis Date Noted  . Postoperative anemia due to acute blood loss 06/09/2013  . Transfusion history 06/09/2013  . OA (osteoarthritis) of knee 06/05/2013  . HIATAL HERNIA 06/18/2007  . FATTY LIVER DISEASE 06/18/2007    Past Medical History:  Past Medical History  Diagnosis Date  . Allergy   . Arthritis   . Substance abuse   . Anemia   . Hypertension   . H/O bronchitis   . Pneumonia     hx of 10 years ago  . Depression     hx of  . History of kidney stones     x3  . Complication of anesthesia     "during breast surgery: could hear the surgery, wake up too soon, anxious   Past Surgical History:  Past Surgical History  Procedure Laterality Date  . Cosmetic surgery  2013    in the office  . Cesarean section  1989, 1992  . Breast surgery  1985    abscess  . Nasal sinus surgery  2000  . Total knee arthroplasty Bilateral 06/05/2013    Procedure: BILATERAL TOTAL KNEE ARTHROPLASTY;  Surgeon: Gearlean Alf, MD;  Location: [facility];  Service: Orthopedics;  Laterality: Bilateral;    Assessment & Plan Clinical Impression: Patient is a 63 y.o. year old female with recent admission to the hospital with history of HTN, OA bilateral knees with failure of conservative therapy as well as QOL issues. Patient elected to undergo B-TKR on 06/05/13 By Dr. Wynelle Link. Post op WBAT and on lovenox/coumadin bridge for DVT prophylaxis. Is WBAT with CPM to assist with ROM. ABLA with hgb 7.1 treated with 2 units PRBC. Therapies initiated and patient limited by fatigue, anxiety as well as dizziness question orthostasis.   Patient transferred to CIR on 06/09/2013 .   Patient currently requires min to mod A with mobility secondary to muscle weakness and muscle joint tightness, decreased cardiorespiratoy endurance and decreased sitting balance, decreased standing balance and decreased balance strategies.  Prior to hospitalization, patient was modified independent  with mobility and lived with Spouse in a House home.  Home access is 2 stairsStairs to enter.  Patient will benefit from skilled PT intervention to maximize safe functional mobility, minimize fall risk and decrease caregiver burden for planned discharge home with 24 hour supervision.  Anticipate patient will benefit from follow up [facility] at discharge.  PT - End of Session Activity Tolerance: Decreased this session Endurance Deficit: Yes Endurance Deficit Description: Overall presents with decreased endurance and orthostatic hypotension noted. BP = 96/62 after transfer off of toilet and O2 85-93% with cues for pursed lip breathing. Multiple rest breaks needed throughout session with mobility. PT Assessment Rehab Potential: Good PT Patient demonstrates impairments in the following area(s): Balance;Edema;Endurance;Pain;Skin Integrity;Safety PT Transfers Functional Problem(s): Bed Mobility;Bed to Chair;Car;Furniture PT Locomotion Functional Problem(s): Ambulation;Wheelchair Mobility;Stairs PT Plan PT Intensity: Minimum of 1-2 x/day ,45 to 90 minutes PT Frequency: 5 out of 7 days PT Duration Estimated Length of Stay: 7-10 days PT Treatment/Interventions: Ambulation/gait training;Balance/vestibular training;Community reintegration;Discharge planning;Disease management/prevention;DME/adaptive equipment instruction;Functional mobility training;Neuromuscular re-education;Patient/family education;Pain management;Skin care/wound management;Splinting/orthotics;Stair training;Therapeutic Activities;Therapeutic Exercise;UE/LE Strength taining/ROM;UE/LE Coordination  activities;Wheelchair  propulsion/positioning PT Transfers Anticipated Outcome(s): mod I basic; min A car PT Locomotion Anticipated Outcome(s): mod I gait; min A stairs PT Recommendation Follow Up Recommendations: Home health PT Patient destination: Home Equipment Recommended: Wheelchair (measurements);Wheelchair cushion (measurements) Equipment Details: has rollator at home; will determine with rollator vs RW appropriate  Skilled Therapeutic Intervention Individual treatment initiated with focus on functional transfers with RW (cues for hand placement) from elevated surfaces including BSC, bed mobility, short distance ambulation, and w/c propulsion and parts management. Pt requiring reinforcement for increased anxiety during mobility and rest breaks due to fatigue and c/o lightheadedness. Returned to bed end of session and left with RN.  PT Evaluation Precautions/Restrictions Precautions Precautions: Fall Precaution Comments: WBAT bilaterally Required Braces or Orthoses:  (D/c KI's BLE as tolerated) Knee Immobilizer - Right: Discontinue once straight leg raise with < 10 degree lag Knee Immobilizer - Left: Discontinue once straight leg raise with < 10 degree lag  Home Living/Prior Functioning Home Living Available Help at Discharge: Family;Available 24 hours/day;Other (Comment) (husband and daughter (from [place]) will be staying with pt) Type of Home: House Home Access: Stairs to enter Entrance Stairs-Number of Steps: 2 stairs Entrance Stairs-Rails: None Home Layout: Two level;Able to live on main level with bedroom/bathroom  Lives With: Spouse Prior Function Level of Independence: Independent with basic ADLs;Independent with gait;Requires assistive device for independence Driving: Yes Comments: used cane for outside and rollator inside Cognition Overall Cognitive Status: Within Functional Limits for tasks assessed Orientation Level: Oriented X4 Memory: Appears intact Awareness:  Appears intact Problem Solving: Appears intact Safety/Judgment: Appears intact Pt demonstrates some emotionally labile behaviors and appears anxious at times during evaluation. Pt requires positive reinforcement. Sensation Sensation Light Touch: Appears Intact Proprioception: Appears Intact Additional Comments: BUEs appear intact Coordination Gross Motor Movements are Fluid and Coordinated: Yes (limted by weakness) Fine Motor Movements are Fluid and Coordinated: Yes (BUEs appear intact) Motor  Motor Motor: Within Functional Limits  Locomotion  Ambulation Ambulation/Gait Assistance: 4: Min guard  Trunk/Postural Assessment  Cervical Assessment Cervical Assessment: Within Functional Limits Thoracic Assessment Thoracic Assessment: Exceptions to [facility] (slightly flexed posture) Lumbar Assessment Lumbar Assessment: Exceptions to [facility] (posterior tilt; body habitus limiting assessment) Postural Control Postural Control: Within Functional Limits  Balance Balance Balance Assessed: Yes Static Sitting Balance Static Sitting - Level of Assistance: 5: Stand by assistance Dynamic Sitting Balance Dynamic Sitting - Level of Assistance: 5: Stand by assistance Static Standing Balance Static Standing - Level of Assistance: 5: Stand by assistance (with RW) Dynamic Standing Balance Dynamic Standing - Level of Assistance: 4: Min assist (with RW) Extremity Assessment  RUE Assessment RUE Assessment: Within Functional Limits (can benefit from BUE strengthening) LUE Assessment LUE Assessment: Within Functional Limits (can benefit from BUE strengthening) RLE Assessment RLE Assessment: Exceptions to [facility] RLE AROM (degrees) RLE Overall AROM Comments: AROM 42 degrees knee flexion in supine; AAROM in supine knee flexion 56 RLE Strength RLE Overall Strength Comments: 2+ to 3-/5 at hip and knee; ankle WFL LLE Assessment LLE Assessment: Exceptions to WFL LLE AROM (degrees) LLE Overall AROM Comments: 3-/5  at hip and knee; ankel WFL LLE Strength LLE Overall Strength Comments: AROM knee flexion supine = 40 degrees; AAROM knee flexion supine 55 degrees  FIM:  FIM - Control and instrumentation engineer Devices: Adult nurse Transfer: 3: Supine > Sit: Mod A (lifting assist/Pt. 50-74%/lift 2 legs;3: Sit > Supine: Mod A (lifting assist/Pt. 50-74%/lift 2 legs);3: Chair or W/C > Bed: Mod A (lift or lower assist);4: Bed >  Chair or W/C: Min A (steadying Pt. > 75%) (elevated surface) FIM - Locomotion: Wheelchair Locomotion: Wheelchair: 2: Travels 45 - 149 ft with supervision, cueing or coaxing FIM - Locomotion: Ambulation Locomotion: Ambulation Assistive Devices: Administrator Ambulation/Gait Assistance: 4: Min guard Locomotion: Ambulation: 1: Travels less than 50 ft with minimal assistance (Pt.>75%)   Refer to Care Plan for Long Term Goals  Recommendations for other services: None  Discharge Criteria: Patient will be discharged from PT if patient refuses treatment 3 consecutive times without medical reason, if treatment goals not met, if there is a change in medical status, if patient makes no progress towards goals or if patient is discharged from hospital.  The above assessment, treatment plan, treatment alternatives and goals were discussed and mutually agreed upon: by patient  Lars Masson 06/10/2013, 9:58 AM

## 2013-06-10 NOTE — Progress Notes (Signed)
Attempted to place patient in CPM.  CPM not working properly; ortho notified and states they will check the machine.  Will continue to monitor.

## 2013-06-10 NOTE — Progress Notes (Signed)
Orthopedic Tech Progress Note Patient Details:  Sharon Daniels [birth year] [id number]  Patient ID: [name], female   DOB: 19-Jan-1950, 63 y.o.   MRN: [id number] Placed pt's lle in cpm @ 0-40 degrees @2145   Aishwarya Shiplett 06/10/2013, 9:49 PM

## 2013-06-10 NOTE — Progress Notes (Signed)
Physical Therapy Session Note  Patient Details  Name: Sharon Daniels MRN: [id number] Date of Birth: [birth year]  Today's Date: 06/10/2013 Time: 1545-1610 Time Calculation (min): 25 min  Short Term Goals: Week 1:  PT Short Term Goal 1 (Week 1): = LTGs  Skilled Therapeutic Interventions/Progress Updates:    Session focused on functional sit to stand transfers from elevated surface (mod A from EOB with multiple attempts despite elevated bed) with RW and cues for hand placement/technique, toilet transfers to elevated BSC, and gait to/from bathroom and out into the hallway with overall S with RW. Returned to bed end of session.  Therapy Documentation Precautions:  Precautions Precautions: Fall Precaution Comments: WBAT bilaterally Required Braces or Orthoses:  (D/c KI's BLE as tolerated) Knee Immobilizer - Right: Discontinue once straight leg raise with < 10 degree lag Knee Immobilizer - Left: Discontinue once straight leg raise with < 10 degree lag Restrictions Weight Bearing Restrictions: No  Pain: C/o 10/10 pain R > L - RN aware and plan to administer medication at end of session.  See FIM for current functional status  Therapy/Group: Individual Therapy  Lars Masson 06/10/2013, 4:19 PM

## 2013-06-11 ENCOUNTER — Inpatient Hospital Stay (HOSPITAL_COMMUNITY): Payer: 59

## 2013-06-11 ENCOUNTER — Inpatient Hospital Stay (HOSPITAL_COMMUNITY): Payer: 59 | Admitting: Occupational Therapy

## 2013-06-11 ENCOUNTER — Encounter (HOSPITAL_COMMUNITY): Payer: 59 | Admitting: Occupational Therapy

## 2013-06-11 ENCOUNTER — Inpatient Hospital Stay (HOSPITAL_COMMUNITY): Payer: 59 | Admitting: *Deleted

## 2013-06-11 LAB — URINE CULTURE: Colony Count: 75000

## 2013-06-11 LAB — BLOOD GAS, ARTERIAL
Acid-Base Excess: 4.7 mmol/L — ABNORMAL HIGH (ref 0.0–2.0)
BICARBONATE: 28.3 meq/L — AB (ref 20.0–24.0)
Drawn by: 257081
FIO2: 0.21 %
O2 Saturation: 89.7 %
PCO2 ART: 39.2 mmHg (ref 35.0–45.0)
PH ART: 7.473 — AB (ref 7.350–7.450)
PO2 ART: 53.3 mmHg — AB (ref 80.0–100.0)
Patient temperature: 99
TCO2: 29.5 mmol/L (ref 0–100)

## 2013-06-11 LAB — PROTIME-INR
INR: 2.54 — ABNORMAL HIGH (ref 0.00–1.49)
PROTHROMBIN TIME: 26.5 s — AB (ref 11.6–15.2)

## 2013-06-11 MED ORDER — SORBITOL 70 % SOLN
60.0000 mL | Status: AC
  Administered 2013-06-11: 60 mL via ORAL
  Filled 2013-06-11: qty 60

## 2013-06-11 MED ORDER — SORBITOL 70 % SOLN
960.0000 mL | TOPICAL_OIL | Freq: Once | ORAL | Status: DC
  Filled 2013-06-11: qty 240

## 2013-06-11 MED ORDER — WARFARIN SODIUM 5 MG PO TABS
5.0000 mg | ORAL_TABLET | Freq: Once | ORAL | Status: AC
  Administered 2013-06-11: 5 mg via ORAL
  Filled 2013-06-11 (×2): qty 1

## 2013-06-11 MED ORDER — SORBITOL 70 % SOLN
960.0000 mL | TOPICAL_OIL | Freq: Once | ORAL | Status: AC
  Administered 2013-06-11: 960 mL via RECTAL
  Filled 2013-06-11: qty 240

## 2013-06-11 MED ORDER — POLYETHYLENE GLYCOL 3350 17 G PO PACK
17.0000 g | PACK | ORAL | Status: AC
  Administered 2013-06-11: 17 g via ORAL
  Filled 2013-06-11: qty 1

## 2013-06-11 MED ORDER — METOCLOPRAMIDE HCL 5 MG PO TABS
5.0000 mg | ORAL_TABLET | Freq: Three times a day (TID) | ORAL | Status: DC
  Administered 2013-06-11 – 2013-06-17 (×23): 5 mg via ORAL
  Filled 2013-06-11 (×28): qty 1

## 2013-06-11 MED ORDER — SODIUM CHLORIDE 0.9 % IV SOLN
INTRAVENOUS | Status: DC
  Administered 2013-06-11 – 2013-06-12 (×2): via INTRAVENOUS
  Administered 2013-06-12 – 2013-06-13 (×2): 75 mL/h via INTRAVENOUS

## 2013-06-11 NOTE — Progress Notes (Signed)
Inpatient Rehabilitation Center Individual Statement of Services  Patient Name:  Sharon Daniels  Date:  06/11/2013  Welcome to the [facility].  Our goal is to provide you with an individualized program based on your diagnosis and situation, designed to meet your specific needs.  With this comprehensive rehabilitation program, you will be expected to participate in at least 3 hours of rehabilitation therapies Monday-Friday, with modified therapy programming on the weekends.  Your rehabilitation program will include the following services:  Physical Therapy (PT), Occupational Therapy (OT), 24 hour per day rehabilitation nursing, Therapeutic Recreation (TR), Case Management (Social Worker), Rehabilitation Medicine, Nutrition Services and Pharmacy Services  Weekly team conferences will be held on Tuesdays to discuss your progress.  Your Social Worker will talk with you frequently to get your input and to update you on team discussions.  Team conferences with you and your family in attendance may also be held.  Expected length of stay: 7 to 10 days  Overall anticipated outcome: Modified Independent with some assistance for car and shower transfers, stairs, and meal preparation  Depending on your progress and recovery, your program may change. Your Social Worker will coordinate services and will keep you informed of any changes. Your Social Worker's name and contact numbers are listed  below.  The following services may also be recommended but are not provided by the [facility] will be made to provide these services after discharge if needed.  Arrangements include referral to agencies that provide these services.  Your insurance has been verified to be:  [place] Your primary doctor is:  Dr. Rachell Cipro  Pertinent information will be shared with your doctor and your insurance company.  Social Worker:  Alfonse Alpers, LCSW  [phone] or ([phone]  Information discussed with and copy given to patient by: Silvestre Mesi Chynah Orihuela, 06/11/2013, 10:59 AM

## 2013-06-11 NOTE — IPOC Note (Signed)
Overall Plan of Care [facility]) Patient Details Name: Sharon Daniels MRN: [id number] DOB: 1950-01-20  Admitting Diagnosis: B TKAS  B CPMS  Hospital Problems: Active Problems:   OA (osteoarthritis) of knee     Functional Problem List: Nursing Motor;Endurance;Pain;Sensory;Skin Integrity  PT Balance;Edema;Endurance;Pain;Skin Integrity;Safety  OT Balance;Edema;Endurance;Motor;Pain;Perception;Safety;Sensory;Skin Integrity  SLP    TR         Basic ADL's: OT Grooming;Bathing;Dressing;Toileting     Advanced  ADL's: OT Simple Meal Preparation     Transfers: PT Bed Mobility;Bed to Chair;Car;Furniture  OT Toilet;Tub/Shower     Locomotion: PT Ambulation;Wheelchair Mobility;Stairs     Additional Impairments: OT None  SLP        TR      Anticipated Outcomes Item Anticipated Outcome  Self Feeding independent  Swallowing      Basic self-care  mod I  Toileting  mod I   Bathroom Transfers mod I  Bowel/Bladder  cont of bowel and bladder mod I  Transfers  mod I basic; min A car  Locomotion  mod I gait; min A stairs  Communication     Cognition     Pain  pain level less than 3/10  Safety/Judgment  patient will be safe and no falls mod I   Therapy Plan: PT Intensity: Minimum of 1-2 x/day ,45 to 90 minutes PT Frequency: 5 out of 7 days PT Duration Estimated Length of Stay: 7-10 days OT Intensity: Minimum of 1-2 x/day, 45 to 90 minutes OT Frequency: 5 out of 7 days OT Duration/Estimated Length of Stay: 7-10 days         Team Interventions: Nursing Interventions Patient/Family Education;Pain Management;Skin Care/Wound Management;Discharge Planning  PT interventions Ambulation/gait training;Balance/vestibular training;Community reintegration;Discharge planning;Disease management/prevention;DME/adaptive equipment instruction;Functional mobility training;Neuromuscular re-education;Patient/family education;Pain management;Skin care/wound  management;Splinting/orthotics;Stair training;Therapeutic Activities;Therapeutic Exercise;UE/LE Strength taining/ROM;UE/LE Coordination activities;Wheelchair propulsion/positioning  OT Interventions Balance/vestibular training;Community reintegration;Discharge planning;DME/adaptive equipment instruction;Functional mobility training;Pain management;Patient/family education;Psychosocial support;Self Care/advanced ADL retraining;Skin care/wound managment;Splinting/orthotics;Therapeutic Activities;Therapeutic Exercise;UE/LE Strength taining/ROM;UE/LE Coordination activities;Wheelchair propulsion/positioning  SLP Interventions    TR Interventions    SW/CM Interventions      Team Discharge Planning: Destination: PT-Home ,OT- Home , SLP-  Projected Follow-up: PT-Home health PT, OT-   (OT: TBD), SLP-  Projected Equipment Needs: PT-Wheelchair (measurements);Wheelchair cushion (measurements), OT- 3 in 1 bedside comode;Tub/shower bench;Tub/shower seat, SLP-  Equipment Details: PT-has rollator at home; will determine with rollator vs RW appropriate, OT-shower seat vs bench Patient/family involved in discharge planning: PT- Patient,  OT-Patient, SLP-   MD ELOS: 7-10 days Medical Rehab Prognosis:  Excellent Assessment: The patient has been admitted for CIR therapies with the diagnosis of bilateral TKA's. The team will be addressing functional mobility, strength, stamina, balance, safety, adaptive techniques and equipment, self-care, bowel and bladder mgt, patient and caregiver education, knee rom, pain control. Goals have been set at Duane Lope, MD, [facility]      See Team Conference Notes for weekly updates to the plan of care

## 2013-06-11 NOTE — Progress Notes (Signed)
Physical Therapy Session Note  Patient Details  Name: Sharon Daniels MRN: [id number] Date of Birth: July 11, 1950  Today's Date: 06/11/2013 Time: 3202-3343 Time Calculation (min): 45 min  Short Term Goals: Week 1:  PT Short Term Goal 1 (Week 1): = LTGs  Skilled Therapeutic Interventions/Progress Updates:   Therapist donned bilateral [place] for edema control. Pt able to complete bed mobility for supine to sit with S today (using bed rails) and extra time. Gait into and out of bathroom with RW and S for toileting needs; S for dynamic standing balance for hygiene and then gait out into hallway x 35' with S with cues for increased knee flexion. Stair negotiation for home entry (using bilateral rails but pt does not have rails at home) with steady A up/down 2 steps. W/c propulsion back to room for UE strengthening and endurance with S. Encouraged to sit up in w/c for at least 30 min to promote OOB tolerance.   Therapy Documentation Precautions:  Precautions Precautions: Fall Precaution Comments: WBAT bilaterally Required Braces or Orthoses:  (D/c KI's BLE as tolerated) Knee Immobilizer - Right: Discontinue once straight leg raise with < 10 degree lag Knee Immobilizer - Left: Discontinue once straight leg raise with < 10 degree lag Restrictions Weight Bearing Restrictions: Yes RLE Weight Bearing: Weight bearing as tolerated LLE Weight Bearing: Weight bearing as tolerated   Pain: Premedicated for pain in BLE.  See FIM for current functional status  Therapy/Group: Individual Therapy  Lars Masson 06/11/2013, 12:06 PM

## 2013-06-11 NOTE — Plan of Care (Signed)
Problem: RH SKIN INTEGRITY Goal: RH STG MAINTAIN SKIN INTEGRITY WITH ASSISTANCE STG Maintain Skin Integrity With mod I Assistance.  Outcome: Progressing Applies nystatin powder as ordered under breasts bid.

## 2013-06-11 NOTE — Progress Notes (Signed)
Social Work Assessment and Plan  Patient Details  Name: Sharon Daniels MRN: [id number] Date of Birth: Oct 09, 1950  Today's Date: 06/11/2013  Problem List:  Patient Active Problem List   Diagnosis Date Noted  . Postoperative anemia due to acute blood loss 06/09/2013  . Transfusion history 06/09/2013  . OA (osteoarthritis) of knee 06/05/2013  . HIATAL HERNIA 06/18/2007  . FATTY LIVER DISEASE 06/18/2007   Past Medical History:  Past Medical History  Diagnosis Date  . Allergy   . Arthritis   . Substance abuse   . Anemia   . Hypertension   . H/O bronchitis   . Pneumonia     hx of 10 years ago  . Depression     hx of  . History of kidney stones     x3  . Complication of anesthesia     "during breast surgery: could hear the surgery, wake up too soon, anxious   Past Surgical History:  Past Surgical History  Procedure Laterality Date  . Cosmetic surgery  2011/02/12    in the office  . Cesarean section  1989, 1992  . Breast surgery  1985    abscess  . Nasal sinus surgery  1998-02-11  . Total knee arthroplasty Bilateral 06/05/2013    Procedure: BILATERAL TOTAL KNEE ARTHROPLASTY;  Surgeon: Gearlean Alf, MD;  Location: [facility];  Service: Orthopedics;  Laterality: Bilateral;   Social History:  reports that she quit smoking about 17 months ago. Her smoking use included Cigarettes. She smoked 0.00 packs per day for 30 years. She has never used smokeless tobacco. She reports that she does not drink alcohol or use illicit drugs.  Family / Support Systems Marital Status: Married Patient Roles: Spouse;Partner Spouse/Significant Other: Annalia Metzger - husband - [phone] (h)  [phone] (m) Children: Karynn Deblasi - dtr - [phone] (c) Other Supports: [place] Anticipated Caregiver: Husband Social research officer, government as well as daughter who will be coming in from [place] Ability/Limitations of Caregiver: husband works and Publishing copy thru Sat; daughter can stay for a week Caregiver Availability:  24/7 (initially, then on her own) Family Dynamics: supportive  Social History Preferred language: English Religion: Presbyterian Education: MSW Read: Yes Write: Yes Employment Status: Employed Name of 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 Legal History/Current Legal Issues: None reported Guardian/Conservator: N/A   Abuse/Neglect Physical Abuse: Denies Verbal Abuse: Denies Sexual Abuse: Denies Exploitation of patient/patient's resources: Denies Self-Neglect: Denies  Emotional Status Pt's affect, behavior and adjustment status: Pt was pleasant and gracious during visit, but she has had some pain and GI issues and was not feeling well during our visit.  She is hopeful to rehab well and feel better soon. Recent Psychosocial Issues: Pt's mother recently died in Feb 11, 2013.  She spent a lot of time with her at [facility]. 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 Anticipated changes in roles/activities/participation: Pt will be out of work for up to 12 weeks. Pt/family expectations/goals: Pt wants for pain to be managed  and bowels to be working better.  Community Resources Express Scripts: None Premorbid Home Care/DME Agencies: None Transportation available at discharge: family Resource referrals recommended: Neuropsychology;Support group (specify) (Pt requesting information about AA meeting held within the hospital. - CSW has  given this to pt.)  Discharge Planning Living Arrangements: Spouse/significant other Support Systems: Spouse/significant other;Children;Church/faith community Type of Residence: Private residence Insurance Resources: Multimedia programmer (specify) ([place]) Financial Resources: Employment;Family Support Financial Screen Referred: No Money Management: Patient;Spouse Home Management: Pt will have some help from her family with home management. Patient/Family Preliminary Plans: Pt will go home and have help from her dtr for a week.  Husband will be home in the evening, but works 11-12 hour days. Barriers to Discharge: Steps Social Work Anticipated Follow Up Needs: HH/OP;Support Group Expected length of stay: 7 to 10 days  Clinical Impression CSW met with pt to introduce self and role of CSW, as well as complete assessment.  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  Pt is having some medical issues that she hopes will be worked out soon and the hospital food has not agreed with her, but other than that, pt feels she is receiving good care.  Pt has a history of substance abuse, but has been clean for 20 years.  She does not report having a problem with pain medication and so that has not been challenging for her.  Pt would like to attend the AA meeting held within the hospital on Sunday morning, not because she needs to, she just wants the support the meeting will offer.  Pt already has a grounds pass and she will just need to alert staff to her whereabouts and expected time back on unit.  CSW blocked pt's schedule for therapy during meeting time. CSW also recommended [facility] for bereavement support as her mother recently died.  Pt is familiar with [facility] as she makes referrals there often for her pts.  She feels they have a great program and was glad for the reminder of the services that may help her.  Pt's husband and dtr will help her when she is  d/c'd.  Dtr is here for a week.  Husband works long days, but is available in the evenings.  He visits her in the hospital after work and will bring outside food to her if she needs something.  CSW to f/u with pt prior to team conference to learn any needs/concerns/questions for the team. Pt does not currently have any needs/concerns/questions.  Silvestre Mesi Prevatt 06/11/2013, 1:01 PM

## 2013-06-11 NOTE — Progress Notes (Signed)
Physical Therapy Session Note  Patient Details  Name: Sharon Daniels MRN: [id number] Date of Birth: 11/23/1950  Today's Date: 06/11/2013 Time: 1224-8250 Time Calculation (min): 30 min  Short Term Goals: Week 1:  PT Short Term Goal 1 (Week 1): = LTGs  Skilled Therapeutic Interventions/Progress Updates:   Therapist making up time for missed minutes earlier this morning due to pt off unit for x-ray and pt agreeable this PM.  Focused on functional transfers (cues for hand placement with sit to stand with RW), endurance/activity tolerance, bed mobility (S for both supine <-> sit this PM session), and gait training with RW (cueing for increased knee flexion, posture, and decreased use of UE's for support). Pt able to gait the length of the hallway x 2 this PM session with seated rest break. Pt continues to require positive reinforcement throughout session and demonstrates increased anxiety.  Therapy Documentation Precautions:  Precautions Precautions: Fall Precaution Comments: WBAT bilaterally Required Braces or Orthoses:  (D/c KI's BLE as tolerated) Knee Immobilizer - Right: Discontinue once straight leg raise with < 10 degree lag Knee Immobilizer - Left: Discontinue once straight leg raise with < 10 degree lag Restrictions Weight Bearing Restrictions: Yes RLE Weight Bearing: Weight bearing as tolerated LLE Weight Bearing: Weight bearing as tolerated  Vital Signs: On 2L O2 = 94% On room air with activity 85-94%. With cues for pursed lip breathing, sats increased within about 10 seconds.  Pain: Beginning of session pt reports pain in stomach > pain in BLE (5/10); after gait increased to 8/10 in BLE. Pt was using ice packs for relief. Locomotion : Ambulation Ambulation/Gait Assistance: 5: Supervision   See FIM for current functional status  Therapy/Group: Individual Therapy  Lars Masson 06/11/2013, 3:54 PM

## 2013-06-11 NOTE — Progress Notes (Signed)
Physical Therapy Session Note  Patient Details  Name: Sharon Daniels MRN: [id number] Date of Birth: 1950-09-21  Today's Date: 06/11/2013 Time: 3267-1245 Time Calculation (min): 20 min  Short Term Goals: Week 1:  PT Short Term Goal 1 (Week 1): = LTGs  Skilled Therapeutic Interventions/Progress Updates:  Therapeutic Exercise: Supine AAROM Knee flexion LLE x5 with verbal cues for patient facilitated muscle contractions. Supine knee extension LLE x10 with cues for increased patient quad contraction.  Pt. C/o 5/10 during supine LLE therapeutic exercise; diminishing with activity stoppage.  Pt. Transported by X-RAY, causing session to be ended early.  Therapy Documentation Precautions:  Precautions Precautions: Fall Precaution Comments: WBAT bilaterally Required Braces or Orthoses:  (D/c KI's BLE as tolerated) Knee Immobilizer - Right: Discontinue once straight leg raise with < 10 degree lag Knee Immobilizer - Left: Discontinue once straight leg raise with < 10 degree lag Restrictions Weight Bearing Restrictions: No  General: Amount of Missed PT Time (min): 40 Minutes Missed Time Reason: X-Ray  Vital Signs: O2 86 - 93 % Cues given for pursed lip breathing.   See FIM for current functional status  Therapy/Group: Individual Therapy  Juluis Mire 06/11/2013, 9:06 AM

## 2013-06-11 NOTE — Progress Notes (Signed)
Recreational Therapy Session Note  Patient Details  Name: Sharon Daniels MRN: [id number] Date of Birth: 25-Jan-1950 Today's Date: 06/11/2013  Pain: pt with c/o bowel pain, discomfort, RN aware Skilled Therapeutic Interventions/Progress Updates: Order received & chart reviewed.  Met with pt and discussed leisure interest & community pursuits.  Pt stated that fear of isolating herself after discharge was most concerning.    Pt requesting information about support groups available to pts, specifically AA.  Informed SW of the above to assist with addressing this issue.  Encouraged pt to develop a plan to reduce isolation at discharge & gave example of ways to do so.  Pt stated understanding & appreciation.    Therapy/Group: Individual Therapy   Waldon Reining 06/11/2013, 3:54 PM

## 2013-06-11 NOTE — Progress Notes (Signed)
Occupational Therapy Session Notes  Patient Details  Name: Sharon Daniels MRN: [id number] Date of Birth: 07/23/1950  Today's Date: 06/11/2013  Short Term Goals: Week 1:  OT Short Term Goal 1 (Week 1): Short Term Goals = Long Term Goals seconary to ELOS  Skilled Therapeutic Interventions/Progress Updates:   Session #1 [phone] - 58 Minutes Individual Therapy Patient with complaints of 5/10 in bilateral knees Patient received supine in bed with RN and husband present. Patient engaged in bed mobility, sat EOB with supervision, and stood with height of bed raised and steady assist from therapist. Patient ambulated into bathroom using RW and completed shower stall transfer onto drop arm BSC. UB/LB bathing completed from here with BLEs propped up on trash can for comfort. Patient happy to shower, stating "I'm so happy, my pains gone down". Patient ambulated > w/c in room after shower for UB/LB dressing in sit<>stand position. Patient transferred back to bed afterwards per RN's request for patient to be taken down for XRay. Therapist notified RN to don TEDs after XRay. Left patient supine in bed with all needs within reach.   Session #2 [id number] - 25 Minutes Individual Therapy Patient with 5/10 complaints of pain in BLEs, 7/10 in stomach; RN aware Patient received supine in bed. Therapist encouraged patient to get OOB for therapy, patient refused stating "I just got back to bed!". Patient engaged in bed mobility and sat EOB. Therapist introduced and educated patient on reacher, sock aid, long handled shoe horn, and long handled sponge. Patient doffed bilateral socks with reacher, then donned bilateral socks using sock aid. Patient then performed sit>supine with min assist (requiring assistance with RLE). Patient left supine in bed with all needs within reach.   Precautions:  Precautions Precautions: Fall Precaution Comments: WBAT bilaterally Required Braces or Orthoses:  (D/c KI's BLE as  tolerated) Knee Immobilizer - Right: Discontinue once straight leg raise with < 10 degree lag Knee Immobilizer - Left: Discontinue once straight leg raise with < 10 degree lag Restrictions Weight Bearing Restrictions: No  See FIM for current functional status  Estelle June 06/11/2013, 7:25 AM

## 2013-06-11 NOTE — Progress Notes (Signed)
Pt used CPM 0-40 (per her request) on LLE from 2145-2345 and on RLE from 0000-0200. Given pain medication at 0207 and currently sleeping. Hortencia Conradi RN

## 2013-06-11 NOTE — Progress Notes (Addendum)
ANTICOAGULATION CONSULT NOTE - Follow Up Consult  Pharmacy Consult for coumadin Indication: VTE prophylaxis  Allergies  Allergen Reactions  . Morphine Itching    REACTION: rash, IV    Patient Measurements: Height: 5\' 8"  (172.7 cm) Weight: 219 lb 9.3 oz (99.6 kg) IBW/kg (Calculated) : 63.9 Heparin Dosing Weight:   Vital Signs: Temp: 98.4 F (36.9 C) (06/04 0516) Temp src: Oral (06/04 0516) BP: 105/69 mmHg (06/03 2100) Pulse Rate: 96 (06/03 2100)  Labs:  Recent Labs  06/09/13 0524 06/10/13 0510 06/10/13 0845 06/11/13 0635  HGB  --  8.4*  --   --   HCT  --  25.1*  --   --   PLT  --  241  --   --   LABPROT 19.3*  --  20.3* 26.5*  INR 1.68*  --  1.79* 2.54*  CREATININE  --  0.66  --   --     Estimated Creatinine Clearance: 91.2 ml/min (by C-G formula based on Cr of 0.66).   Medications:  Scheduled:  . enoxaparin (LOVENOX) injection  30 mg Subcutaneous Q12H  . irbesartan  75 mg Oral Daily  . iron polysaccharides  150 mg Oral q12n4p  . loratadine  10 mg Oral Daily  . montelukast  10 mg Oral Daily  . nystatin  5 mL Oral Daily  . nystatin   Topical BID  . OxyCODONE  10 mg Oral Q12H  . pantoprazole  80 mg Oral Daily  . polyethylene glycol  17 g Oral Daily  . Warfarin - Pharmacist Dosing Inpatient   Does not apply q1800   Infusions:    Assessment: 63 yo female s/p ortho surgery is currently on therapeutic coumadin for VTE prophylaxis, but INR up to 2.54 from 1.79.  On lovenox 30mg  sq q12h Goal of Therapy:  INR 2-3 Monitor platelets by anticoagulation protocol: Yes   Plan:  1) d/c lovenox 2) Coumadin 5mg  po x 1 3) INR in am  Tsz-Yin Roshanna Cimino 06/11/2013,8:11 AM

## 2013-06-11 NOTE — Progress Notes (Signed)
[facility] PHYSICAL MEDICINE & REHABILITATION     PROGRESS NOTE    Subjective/Complaints: Did a little better last night. Had issues with the cpm machine. Still no bm. Legs sore.  A 12 point review of systems has been performed and if not noted above is otherwise negative.   Objective: Vital Signs: Blood pressure 105/69, pulse 96, temperature 98.4 F (36.9 C), temperature source Oral, resp. rate 18, height 5\' 8"  (1.727 m), weight 99.6 kg (219 lb 9.3 oz), SpO2 95.00%. No results found.  Recent Labs  06/10/13 0510  WBC 7.3  HGB 8.4*  HCT 25.1*  PLT 241    Recent Labs  06/10/13 0510  NA 137  K 4.2  CL 97  GLUCOSE 117*  BUN 15  CREATININE 0.66  CALCIUM 8.4   CBG (last 3)  No results found for this basename: GLUCAP,  in the last 72 hours  Wt Readings from Last 3 Encounters:  06/10/13 99.6 kg (219 lb 9.3 oz)  06/05/13 95.255 kg (210 lb)  06/05/13 95.255 kg (210 lb)    Physical Exam:  Constitutional: She appears well-developed.  HENT:  Head: Normocephalic and atraumatic.  Eyes: Conjunctivae are normal. Pupils are equal, round, and reactive to light.  Neck: No JVD present. No tracheal deviation present. No thyromegaly present.  Cardiovascular: Normal rate. No murmur  Respiratory: No respiratory distress.  GI: She exhibits persistent distension. Scarce bowel sounds still Musculoskeletal:  1+ edema around either knee (no changes).  Skin: wounds intact. No drainage or erythema Neurological: alert and oriented x 3. Cognitively appropriate UE's 5/5. KE and HF limited due to knee pain/surgery--grossly 1+ to 2 respectively. Normal ADF/APF bilaterally. No sensory changes. Dtr;s 1+ Psychiatric: She has a normal mood and affect. Her behavior is normal. Thought content normal   Assessment/Plan: 1. 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 Physiatrist is providing close team supervision and 24 hour management of active medical problems listed below. Physiatrist and rehab team continue to assess barriers to discharge/monitor patient progress toward functional and medical goals. FIM: FIM - Bathing Bathing: 0: Activity did not occur  FIM - Upper Body Dressing/Undressing Upper body dressing/undressing: 1: Total-Patient completed less than 25% of tasks (seated EOB) FIM - Lower Body Dressing/Undressing Lower body dressing/undressing: 1: Total-Patient completed less than 25% of tasks (sit<>stand EOB)  FIM - Toileting Toileting: 1: Total-Patient completed zero steps, helper did all 3  FIM - Radio producer Devices: Nurse, learning disability Transfers: 4-To toilet/BSC: Min A (steadying Pt. > 75%);4-From toilet/BSC: Min A (steadying Pt. > 75%)  FIM - Bed/Chair Transfer Bed/Chair Transfer Assistive Devices: Adult nurse Transfer: 3: Supine > Sit: Mod A (lifting assist/Pt. 50-74%/lift 2 legs;3: Sit > Supine: Mod A (lifting assist/Pt. 50-74%/lift 2 legs);3: Chair or W/C > Bed: Mod A (lift or lower assist);4: Bed > Chair or W/C: Min A (steadying Pt. > 75%) (elevated surface)  FIM - Locomotion: Wheelchair Locomotion: Wheelchair: 2: Travels 50 - 149 ft with supervision, cueing or coaxing FIM - Locomotion: Ambulation Locomotion: Ambulation Assistive Devices: Administrator Ambulation/Gait Assistance: 4: Min guard Locomotion: Ambulation: 1: Travels less than 50 ft with minimal assistance (Pt.>75%)  Comprehension Comprehension Mode: Auditory Comprehension: 6-Follows complex conversation/direction: With extra time/assistive device  Expression Expression Mode: Verbal Expression: 6-Expresses complex ideas: With extra time/assistive device  Social Interaction Social Interaction: 6-Interacts appropriately with others with  medication or extra time (anti-anxiety, antidepressant).  Problem  Solving Problem Solving: 6-Solves complex problems: With extra time  Memory Memory: 6-More than reasonable amt of time Medical Problem List and Plan:  1. 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 DVT Prophylaxis/Anticoagulation: Pharmaceutical: Coumadin and Lovenox till INR therapeutic X 4 weeks followed by ASA 81 mg for 4 weeks.  3. Pain Management: added OxyContin CR 10 mg bid for more consistent pain relief   - Will continue prn oxycodone and advised patient to use ice past therapy sessions.  4. Mood: Team to provide ego support to help with anxiety issues. LCSW to follow for evaluation and support.  5. Neuropsych: This patient is capable of making decisions on her own behalf.  6. ABLA:continue iron supplement. Recheck hgb Friday 7. HTN: Monitor BP every 8 hours. Blood pressures on low side therefore will set parameters on Avapro. Check orthostatic BP/pulse. Encourage/push po fluids.  8. H/o Seasonal allergies: continue Singulair and Claritin. Encourage IS every hour to help with pulmonary toilet. Wean oxygen as able  9. GERD: Stable on protonix.  10. Constipation: No BM post op. Will schedule Miralax daily and augment program as needed.   -sorbitol and SSE today -kub today  11. Reactive leucocytosis: resolved  -low grade temp present (?related to constipation)   LOS (Days) 2 A FACE TO FACE EVALUATION WAS PERFORMED  Meredith Staggers 06/11/2013 7:39 AM

## 2013-06-11 NOTE — Progress Notes (Signed)
SMOG enema administered at 1951 with large results x3, soft consistency. Patient still passing flatus, will continue to monitor.

## 2013-06-12 ENCOUNTER — Inpatient Hospital Stay (HOSPITAL_COMMUNITY): Payer: 59

## 2013-06-12 ENCOUNTER — Encounter (HOSPITAL_COMMUNITY): Payer: 59

## 2013-06-12 ENCOUNTER — Inpatient Hospital Stay (HOSPITAL_COMMUNITY): Payer: 59 | Admitting: Rehabilitation

## 2013-06-12 ENCOUNTER — Inpatient Hospital Stay (HOSPITAL_COMMUNITY): Payer: 59 | Admitting: Physical Therapy

## 2013-06-12 DIAGNOSIS — Z96659 Presence of unspecified artificial knee joint: Secondary | ICD-10-CM

## 2013-06-12 DIAGNOSIS — IMO0002 Reserved for concepts with insufficient information to code with codable children: Secondary | ICD-10-CM

## 2013-06-12 DIAGNOSIS — D62 Acute posthemorrhagic anemia: Secondary | ICD-10-CM

## 2013-06-12 DIAGNOSIS — I803 Phlebitis and thrombophlebitis of lower extremities, unspecified: Secondary | ICD-10-CM

## 2013-06-12 DIAGNOSIS — M1710 Unilateral primary osteoarthritis, unspecified knee: Secondary | ICD-10-CM

## 2013-06-12 LAB — BASIC METABOLIC PANEL
BUN: 9 mg/dL (ref 6–23)
CO2: 26 mEq/L (ref 19–32)
Calcium: 8.3 mg/dL — ABNORMAL LOW (ref 8.4–10.5)
Chloride: 97 mEq/L (ref 96–112)
Creatinine, Ser: 0.55 mg/dL (ref 0.50–1.10)
Glucose, Bld: 154 mg/dL — ABNORMAL HIGH (ref 70–99)
Potassium: 3.5 mEq/L — ABNORMAL LOW (ref 3.7–5.3)
Sodium: 135 mEq/L — ABNORMAL LOW (ref 137–147)

## 2013-06-12 LAB — PROTIME-INR
INR: 2.74 — ABNORMAL HIGH (ref 0.00–1.49)
PROTHROMBIN TIME: 28.1 s — AB (ref 11.6–15.2)

## 2013-06-12 MED ORDER — WARFARIN SODIUM 5 MG PO TABS
5.0000 mg | ORAL_TABLET | Freq: Once | ORAL | Status: AC
  Administered 2013-06-12: 5 mg via ORAL
  Filled 2013-06-12: qty 1

## 2013-06-12 MED ORDER — POTASSIUM CHLORIDE CRYS ER 20 MEQ PO TBCR
20.0000 meq | EXTENDED_RELEASE_TABLET | Freq: Three times a day (TID) | ORAL | Status: DC
  Administered 2013-06-12 – 2013-06-16 (×12): 20 meq via ORAL
  Filled 2013-06-12 (×16): qty 1

## 2013-06-12 MED ORDER — IOHEXOL 350 MG/ML SOLN
100.0000 mL | Freq: Once | INTRAVENOUS | Status: AC | PRN
  Administered 2013-06-12: 100 mL via INTRAVENOUS

## 2013-06-12 NOTE — Progress Notes (Signed)
Physical Therapy Note  Patient Details  Name: MATTESON BLUE MRN: [id number] Date of Birth: 1950-04-05 Today's Date: 06/12/2013  Pt missed 30 mins of skilled PT this afternoon due to continued bedrest for PEs.  Will continue to check back as warranted.    Raquel Sarna A Eldon Zietlow 06/12/2013, 3:42 PM

## 2013-06-12 NOTE — Progress Notes (Signed)
Occupational Therapy Note  Patient Details  Name: Sharon Daniels MRN: [id number] Date of Birth: 1950/10/23 Today's Date: 06/12/2013  Pt missed 60 mins skilled OT services.  Patient on bedrest per MD.   Leroy Libman 06/12/2013, 11:23 AM

## 2013-06-12 NOTE — Progress Notes (Signed)
Physical Therapy Session Note  Patient Details  Name: Sharon Daniels MRN: [id number] Date of Birth: 26-Jan-1950  Today's Date: 06/12/2013    Short Term Goals: Week 1:  PT Short Term Goal 1 (Week 1): = LTGs  Skilled Therapeutic Interventions/Progress Updates:  Pt. Inappropriate for treatment due to CT results showing PE, and DVT RLE. Will wait for order from MD to resume therapies, once patient safe to mobilize.  Therapy Documentation Precautions:  Precautions Precautions: Fall Precaution Comments: WBAT bilaterally Required Braces or Orthoses:  (D/c KI's BLE as tolerated) Knee Immobilizer - Right: Discontinue once straight leg raise with < 10 degree lag Knee Immobilizer - Left: Discontinue once straight leg raise with < 10 degree lag Restrictions Weight Bearing Restrictions: Yes RLE Weight Bearing: Weight bearing as tolerated LLE Weight Bearing: Weight bearing as tolerated  See FIM for current functional status  Therapy/Group: Individual Therapy  Juluis Mire 06/12/2013, 1:09 PM

## 2013-06-12 NOTE — Plan of Care (Signed)
Problem: RH BOWEL ELIMINATION Goal: RH STG MANAGE BOWEL WITH ASSISTANCE STG Manage Bowel with min Assistance. Outcome: Not Progressing Total assist by staff Goal: RH STG MANAGE BOWEL W/MEDICATION W/ASSISTANCE STG Manage Bowel with Medication with min Assistance. Outcome: Not Progressing Total assist by staff x2 enema

## 2013-06-12 NOTE — Progress Notes (Addendum)
Physical Therapy Session Note  Patient Details  Name: Sharon Daniels MRN: [id number] Date of Birth: 28-Apr-1950  Today's Date: 06/12/2013  Short Term Goals: Week 1:  PT Short Term Goal 1 (Week 1): = LTGs  Skilled Therapeutic Interventions/Progress Updates:  Pt. Missed 60 min of physical therapy due to being off the floor for CT scan. Will follow up this afternoon.  Therapy Documentation Precautions:  Precautions Precautions: Fall Precaution Comments: WBAT bilaterally Required Braces or Orthoses:  (D/c KI's BLE as tolerated) Knee Immobilizer - Right: Discontinue once straight leg raise with < 10 degree lag Knee Immobilizer - Left: Discontinue once straight leg raise with < 10 degree lag Restrictions Weight Bearing Restrictions: No RLE Weight Bearing: Weight bearing as tolerated LLE Weight Bearing: Weight bearing as tolerated  See FIM for current functional status  Therapy/Group: Individual Therapy  Juluis Mire 06/12/2013, 9:22 AM

## 2013-06-12 NOTE — Progress Notes (Addendum)
[facility] PHYSICAL MEDICINE & REHABILITATION     PROGRESS NOTE    Subjective/Complaints: No problems overnight per RN. Moved bowels multiple times. A 12 point review of systems has been performed and if not noted above is otherwise negative.   Objective: Vital Signs: Blood pressure 128/77, pulse 86, temperature 99.2 F (37.3 C), temperature source Oral, resp. rate 18, height 5\' 8"  (1.727 m), weight 99.6 kg (219 lb 9.3 oz), SpO2 94.00%. Dg Abd 1 View  06/11/2013   CLINICAL DATA:  Abdominal distension and obstipation  EXAM: ABDOMEN - 1 VIEW  COMPARISON:  CT abdomen and pelvis February 06, 2008  FINDINGS: There is extensive stool throughout the ascending colon and proximal transverse colon. Stool is moderate elsewhere. There is mild generalized colonic dilatation. There is no appreciable small bowel obstruction. No free air or portal venous air seen on this supine examination. No abnormal calcifications.  IMPRESSION: Mild generalized colonic dilatation. There is copious stool throughout the ascending and proximal transverse colon with moderate stool elsewhere. Question a degree of colonic ileus. Frank bowel obstruction is not seen. No free air.   Electronically Signed   By: Lowella Grip M.D.   On: 06/11/2013 10:03    Recent Labs  06/10/13 0510  WBC 7.3  HGB 8.4*  HCT 25.1*  PLT 241    Recent Labs  06/10/13 0510  NA 137  K 4.2  CL 97  GLUCOSE 117*  BUN 15  CREATININE 0.66  CALCIUM 8.4   CBG (last 3)  No results found for this basename: GLUCAP,  in the last 72 hours  Wt Readings from Last 3 Encounters:  06/10/13 99.6 kg (219 lb 9.3 oz)  06/05/13 95.255 kg (210 lb)  06/05/13 95.255 kg (210 lb)    Physical Exam:  Constitutional: She appears well-developed.  HENT:  Head: Normocephalic and atraumatic.  Eyes: Conjunctivae are normal. Pupils are equal, round, and reactive to light.  Neck: No JVD present. No tracheal deviation present. No thyromegaly present.   Cardiovascular: Normal rate. No murmur  Respiratory: No respiratory distress.  GI: She exhibits less distension. Bowel sounds improved Musculoskeletal:  1+ edema around either knee (no changes).  Skin: wounds intact. No drainage or erythema Neurological: alert and oriented x 3. Cognitively appropriate UE's 5/5. KE and HF limited due to knee pain/surgery--grossly 1+ to 2 respectively. Normal ADF/APF bilaterally. No sensory changes. Dtr;s 1+ Psychiatric: She has a normal mood and affect. Her behavior is normal. Thought content normal   Assessment/Plan: 1. 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 Physiatrist is providing close team supervision and 24 hour management of active medical problems listed below. Physiatrist and rehab team continue to assess barriers to discharge/monitor patient progress toward functional and medical goals. FIM: FIM - Bathing Bathing Steps Patient Completed: Chest;Right Arm;Left Arm;Abdomen;Front perineal area;Buttocks;Right upper leg;Left upper leg Bathing: 4: Min-Patient completes 8-9 8f 10 parts or 75+ percent  FIM - Upper Body Dressing/Undressing Upper body dressing/undressing steps patient completed: Thread/unthread right sleeve of pullover shirt/dresss;Thread/unthread left sleeve of pullover shirt/dress;Put head through opening of pull over shirt/dress;Pull shirt over trunk Upper body dressing/undressing: 5: Set-up assist to: Obtain clothing/put away (seated in w/c) FIM - Lower Body Dressing/Undressing Lower body dressing/undressing: 1: Total-Patient completed less than 25% of tasks (sit<>stand position from w/c)  FIM - Toileting Toileting steps completed by patient: Adjust clothing prior to toileting;Performs perineal hygiene;Adjust  clothing after toileting Toileting Assistive Devices: Grab bar or rail for  support Toileting: 6: Assistive device: No helper  FIM - Radio producer Devices: Walker;Elevated toilet seat Toilet Transfers: 5-To toilet/BSC: Supervision (verbal cues/safety issues);5-From toilet/BSC: Supervision (verbal cues/safety issues)  FIM - Bed/Chair Transfer Bed/Chair Transfer Assistive Devices: Bed rails;Walker Bed/Chair Transfer: 5: Supine > Sit: Supervision (verbal cues/safety issues);5: Sit > Supine: Supervision (verbal cues/safety issues);4: Bed > Chair or W/C: Min A (steadying Pt. > 75%);4: Chair or W/C > Bed: Min A (steadying Pt. > 75%)  FIM - Locomotion: Wheelchair Locomotion: Wheelchair: 5: Travels 150 ft or more: maneuvers on rugs and over door sills with supervision, cueing or coaxing FIM - Locomotion: Ambulation Locomotion: Ambulation Assistive Devices: Administrator Ambulation/Gait Assistance: 5: Supervision Locomotion: Ambulation: 2: Travels 50 - 149 ft with supervision/safety issues  Comprehension Comprehension Mode: Auditory Comprehension: 6-Follows complex conversation/direction: With extra time/assistive device  Expression Expression Mode: Verbal Expression: 7-Expresses complex ideas: With no assist  Social Interaction Social Interaction: 6-Interacts appropriately with others with medication or extra time (anti-anxiety, antidepressant).  Problem Solving Problem Solving: 6-Solves complex problems: With extra time  Memory Memory: 5-Requires cues to use assistive device Medical Problem List and Plan:  1. 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 DVT Prophylaxis/?PE: coumadin therapeutic yesterday, cxr without disease  -given ongoing hypoxia ABG was checked which showed increased gradient  -check CT chest and  dopplers today given persistent hypoxia and ABG.  -cross cover with lovenox for today pending INR   -O2 via [place] to keep sats greater than 90%  -pt is comfortable and stable to be  on unit 3. Pain Management: added OxyContin CR 10 mg bid for more consistent pain relief   - Will continue prn oxycodone and advised patient to use ice past therapy sessions.  4. Mood: Team to provide ego support to help with anxiety issues. LCSW to follow for evaluation and support.  5. Neuropsych: This patient is capable of making decisions on her own behalf.  6. ABLA:continue iron supplement. Recheck hgb today 7. HTN: Monitor BP every 8 hours. Blood pressures on low side therefore will set parameters on Avapro.  8. H/o Seasonal allergies: continue Singulair and Claritin. Encourage IS every hour to help with pulmonary toilet. Wean oxygen as able  9. GERD: Stable on protonix.  10. Constipation: No BM post op. miralax -liquid diet   -sorbitol and SSE effective -kub today to follow up  11. Reactive leucocytosis: resolved  -low grade temp present (?related to constipation)  -dopplers today   LOS (Days) 3 A FACE TO FACE EVALUATION WAS PERFORMED  Meredith Staggers 06/12/2013 7:45 AM

## 2013-06-12 NOTE — Progress Notes (Signed)
ANTICOAGULATION CONSULT NOTE - Follow Up Consult  Pharmacy Consult for coumadin Indication: r/o PE + VTE prophylaxis for ortho surgery  Allergies  Allergen Reactions  . Morphine Itching    REACTION: rash, IV    Patient Measurements: Height: 5\' 8"  (172.7 cm) Weight: 219 lb 9.3 oz (99.6 kg) IBW/kg (Calculated) : 63.9 Heparin Dosing Weight:   Vital Signs: Temp: 99.2 F (37.3 C) (06/05 0607) Temp src: Oral (06/05 0607) BP: 128/77 mmHg (06/05 0607) Pulse Rate: 86 (06/05 0607)  Labs:  Recent Labs  06/10/13 0510 06/10/13 0845 06/11/13 4656 06/12/13 0825  HGB 8.4*  --   --   --   HCT 25.1*  --   --   --   PLT 241  --   --   --   LABPROT  --  20.3* 26.5* 28.1*  INR  --  1.79* 2.54* 2.74*  CREATININE 0.66  --   --   --     Estimated Creatinine Clearance: 91.2 ml/min (by C-G formula based on Cr of 0.66).   Medications:  Scheduled:  . irbesartan  75 mg Oral Daily  . iron polysaccharides  150 mg Oral q12n4p  . loratadine  10 mg Oral Daily  . metoCLOPramide  5 mg Oral TID AC & HS  . montelukast  10 mg Oral Daily  . nystatin  5 mL Oral Daily  . nystatin   Topical BID  . OxyCODONE  10 mg Oral Q12H  . pantoprazole  80 mg Oral Daily  . polyethylene glycol  17 g Oral Daily  . Warfarin - Pharmacist Dosing Inpatient   Does not apply q1800   Infusions:  . sodium chloride 75 mL/hr at 06/11/13 2142    Assessment: 63 yo female is currently on therapeutic coumadin for r/o PE (new on 06/05) and was originally for VTE prophylaxis s/p ortho surgery.  INR today is up to 2.74. Prophylactic-dose LMWH was started by ortho on 5/31 twelve hours after removal of the epidural catheter and was d/c'ed on 06/04 due to therapeutic INR.   Goal of Therapy:  INR 2-3 Monitor platelets by anticoagulation protocol: Yes   Plan:  Coumadin 5 mg PO x1 Daily INR while inpatient F/u CT angio result    Tsz-Yin Daizha Anand 06/12/2013,9:13 AM

## 2013-06-12 NOTE — Progress Notes (Signed)
VASCULAR LAB PRELIMINARY  PRELIMINARY  PRELIMINARY  PRELIMINARY  Bilateral lower extremity venous duplex  completed.    Preliminary report:  Right:  DVT noted in the gastrocnemius vein.  No evidence of superficial thrombosis.  No Baker's cyst.  Left:  No evidence of DVT, superficial thrombosis, or Baker's cyst.  Nani Ravens, RVT 06/12/2013, 11:29 AM

## 2013-06-13 ENCOUNTER — Encounter (HOSPITAL_COMMUNITY): Payer: Self-pay | Admitting: Internal Medicine

## 2013-06-13 ENCOUNTER — Inpatient Hospital Stay (HOSPITAL_COMMUNITY): Payer: 59

## 2013-06-13 DIAGNOSIS — I2699 Other pulmonary embolism without acute cor pulmonale: Secondary | ICD-10-CM

## 2013-06-13 DIAGNOSIS — IMO0002 Reserved for concepts with insufficient information to code with codable children: Secondary | ICD-10-CM

## 2013-06-13 DIAGNOSIS — Z96659 Presence of unspecified artificial knee joint: Secondary | ICD-10-CM

## 2013-06-13 DIAGNOSIS — M1710 Unilateral primary osteoarthritis, unspecified knee: Secondary | ICD-10-CM

## 2013-06-13 HISTORY — DX: Other pulmonary embolism without acute cor pulmonale: I26.99

## 2013-06-13 LAB — PROTIME-INR
INR: 2.86 — ABNORMAL HIGH (ref 0.00–1.49)
Prothrombin Time: 29 seconds — ABNORMAL HIGH (ref 11.6–15.2)

## 2013-06-13 MED ORDER — WARFARIN SODIUM 5 MG PO TABS
5.0000 mg | ORAL_TABLET | Freq: Once | ORAL | Status: AC
  Administered 2013-06-13: 5 mg via ORAL
  Filled 2013-06-13: qty 1

## 2013-06-13 NOTE — Progress Notes (Signed)
Physical Therapy Session Note  Patient Details  Name: Sharon Daniels MRN: [id number] Date of Birth: 12-16-1950  Today's Date: 06/13/2013 Time: 1301-1403 Time Calculation (min): 62 min  Short Term Goals: Week 1:  PT Short Term Goal 1 (Week 1): = LTGs  Skilled Therapeutic Interventions/Progress Updates:  Pt. Cleared by Dr. For therapy with known PE and RLE DVT. Bedrest discontinued 06/13/2013 AM.  Ther Activity: Pt. Supine<>sit min A with bed rails. Sit<>stand RW with bed elevated to assist Bilat TKA limitations: strength and ROM min guard assist. Pt. Stand<>sit for toileting on raised AD with RW for support and balance. Pt. Performed pericare without assistance; pt. Required min A for bathroom navigation due to confined space and IV pole. Pt. Returned to EOB for There Exercise. Supervision.  Ther Exercise: seated hip flexion 3 x 10 each LE. In Supine/bed: heel slides 3x10 ea. with AAROM for increased joint mobility; Knee extension/presses 3 x 10 ea.; hip abd 2x10 ea.; Hip add 2x10 ea.; Ankle pumps: dorsi/plantar flexion 3x10 ea.  Pt. States unrated discomfort; states 3-5/10 pain with activity.  Pt. Coached to perform leg mobility in bed and not lay static.   Pt. Informed by RN that CPM use can continue on RLE today.  Therapy Documentation Precautions:  Precautions Precautions: Fall Precaution Comments: WBAT bilaterally Required Braces or Orthoses:  (D/c KI's BLE as tolerated) Knee Immobilizer - Right: Discontinue once straight leg raise with < 10 degree lag Knee Immobilizer - Left: Discontinue once straight leg raise with < 10 degree lag Restrictions Weight Bearing Restrictions: No RLE Weight Bearing: Weight bearing as tolerated LLE Weight Bearing: Weight bearing as tolerated General:   Vital Signs: Pulse Rate: 103 Oxygen Therapy SpO2: 91 % O2 Device: None (Room air) O2 Flow Rate (L/min): 2 L/min (Pt. returned to O2 nasal canula post tx.)  Pain: Pain Assessment Pain  Assessment: 0-10 Pain Score: 5  Pain Type: Acute pain Pain Location: Knee Pain Orientation: Right Pain Descriptors / Indicators: Burning;Pressure;Aching Pain Onset: With Activity Patients Stated Pain Goal: 3 Pain Intervention(s): Rest Multiple Pain Sites: Yes 2nd Pain Site Pain Score: 3 Pain Type: Acute pain Pain Location: Knee Pain Orientation: Left Pain Descriptors / Indicators: Aching;Pressure;Burning Pain Frequency: Intermittent Pain Onset: With Activity Patient's Stated Pain Goal: 2 Pain Intervention(s): Rest  Locomotion : Ambulation Ambulation/Gait Assistance: 5: Supervision   See FIM for current functional status  Therapy/Group: Individual Therapy  Juluis Mire 06/13/2013, 4:18 PM

## 2013-06-13 NOTE — Progress Notes (Signed)
Contacted MD in regards to use of CPM machine today given presence of DVT in RLE, discovered yesterday. MD stated it was OK to use CPM on bilateral lower extremities today. Hortencia Conradi RN

## 2013-06-13 NOTE — Progress Notes (Signed)
ANTICOAGULATION CONSULT NOTE - Follow Up Consult  Pharmacy Consult for coumadin Indication: Pulmonary Embolus seen on CT/angio 06/12/13  Allergies  Allergen Reactions  . Morphine Itching    REACTION: rash, IV    Patient Measurements: Height: 5\' 8"  (172.7 cm) Weight: 219 lb 9.3 oz (99.6 kg) IBW/kg (Calculated) : 63.9 Heparin Dosing Weight:   Vital Signs: Temp: 98.2 F (36.8 C) (06/06 0600)  Labs:  Recent Labs  06/11/13 0635 06/12/13 0825 06/13/13 0525  LABPROT 26.5* 28.1* 29.0*  INR 2.54* 2.74* 2.86*  CREATININE  --  0.55  --     Estimated Creatinine Clearance: 91.2 ml/min (by C-G formula based on Cr of 0.55).   Medications:  Scheduled:  . irbesartan  75 mg Oral Daily  . iron polysaccharides  150 mg Oral q12n4p  . loratadine  10 mg Oral Daily  . metoCLOPramide  5 mg Oral TID AC & HS  . montelukast  10 mg Oral Daily  . nystatin  5 mL Oral Daily  . nystatin   Topical BID  . OxyCODONE  10 mg Oral Q12H  . pantoprazole  80 mg Oral Daily  . polyethylene glycol  17 g Oral Daily  . potassium chloride  20 mEq Oral TID  . Warfarin - Pharmacist Dosing Inpatient   Does not apply q1800   Infusions:  . sodium chloride 75 mL/hr at 06/12/13 2223    Assessment: 63 yo female is currently on therapeutic coumadin for r/o PE (new on 06/05) and was originally for VTE prophylaxis s/p ortho surgery.  INR today is up to 2.86. Prophylactic-dose LMWH was started by ortho on 5/31 twelve hours after removal of the epidural catheter and was d/c'ed on 06/04 due to therapeutic INR. CT angio now positive for PE.  Goal of Therapy:  INR 2-3 Monitor platelets by anticoagulation protocol: Yes   Plan:  Coumadin 5 mg PO x1 Daily INR while inpatient   Gearldine Bienenstock Antrell Tipler 06/13/2013,10:23 AM

## 2013-06-13 NOTE — Progress Notes (Signed)
Orthopedic Tech Progress Note Patient Details:  Sharon Daniels [birth year] [id number]  CPM Left Knee CPM Left Knee: On Left Knee Flexion (Degrees): 70 Left Knee Extension (Degrees): 0 CPM Right Knee CPM Right Knee: Off Right Knee Flexion (Degrees): 55   Asia R Thompson 06/13/2013, 2:53 PM

## 2013-06-13 NOTE — Progress Notes (Signed)
Patient ID: Sharon Daniels, female   DOB: 31-Jul-1950, 63 y.o.   MRN: [id number] Sharon Daniels is a 63 y.o. female [birth year] [id number]  Subjective: No new complaints. Breathing ok and no chest pain. Requests AC PIV to be removed (from CT angio yest) No new problems. Slept well. Feeling OK.  Objective: Vital signs in last 24 hours: Temp:  [98.2 F (36.8 C)-98.9 F (37.2 C)] 98.2 F (36.8 C) (06/06 0600) Pulse Rate:  [87-90] 87 (06/05 2049) Resp:  [19-20] 19 (06/05 2049) BP: (106-114)/(69-71) 114/71 mmHg (06/05 2049) SpO2:  [95 %-98 %] 95 % (06/06 0600) Weight change:  Last BM Date: 06/12/13  Intake/Output from previous day: 06/05 0701 - 06/06 0700 In: 720 [P.O.:720] Out: 58 [Urine:58]  Physical Exam General: No apparent distress    Lungs: Normal effort. Lungs clear to auscultation, no crackles or wheezes. Cardiovascular: Regular rate and rhythm, no edema Musculoskeletal: 1+ edema around either knee (no changes). neurovasc intact Skin: wounds intact. No drainage or erythema  Neurological: alert and oriented x 3. Cognitively appropriate UE's 5/5. KE and HF limited due to knee pain/surgery--grossly 1+ to 2 respectively. Normal ADF/APF bilaterally. No sensory changes. Dtr;s 1+ Wounds:  Clean, dry, intact. No signs of infection.  Lab Results: BMET    Component Value Date/Time   NA 135* 06/12/2013 0825   K 3.5* 06/12/2013 0825   CL 97 06/12/2013 0825   CO2 26 06/12/2013 0825   GLUCOSE 154* 06/12/2013 0825   BUN 9 06/12/2013 0825   CREATININE 0.55 06/12/2013 0825   CALCIUM 8.3* 06/12/2013 0825   GFRNONAA >90 06/12/2013 0825   GFRAA >90 06/12/2013 0825   CBC    Component Value Date/Time   WBC 7.3 06/10/2013 0510   WBC 7.2 12/12/2005 0843   RBC 2.70* 06/10/2013 0510   RBC 3.64* 12/12/2005 0843   HGB 8.4* 06/10/2013 0510   HGB 11.6 12/12/2005 0843   HCT 25.1* 06/10/2013 0510   HCT 34.5* 12/12/2005 0843   PLT 241 06/10/2013 0510   PLT 269 12/12/2005 0843   MCV 93.0 06/10/2013 0510   MCV 95.0  12/12/2005 0843   MCH 31.1 06/10/2013 0510   MCH 32.0 12/12/2005 0843   MCHC 33.5 06/10/2013 0510   MCHC 33.7 12/12/2005 0843   RDW 15.1 06/10/2013 0510   RDW 14.2 12/12/2005 0843   LYMPHSABS 1.5 06/10/2013 0510   LYMPHSABS 1.8 12/12/2005 0843   MONOABS 0.8 06/10/2013 0510   MONOABS 0.6 12/12/2005 0843   EOSABS 0.3 06/10/2013 0510   EOSABS 0.2 12/12/2005 0843   BASOSABS 0.0 06/10/2013 0510   BASOSABS 0.1 12/12/2005 0843   CBG's (last 3):  No results found for this basename: GLUCAP,  in the last 72 hours LFT's Lab Results  Component Value Date   ALT 15 06/10/2013   AST 20 06/10/2013   ALKPHOS 74 06/10/2013   BILITOT 0.4 06/10/2013    Studies/Results: Dg Chest 2 View  06/12/2013   CLINICAL DATA:  Evaluate for pulmonary embolism  EXAM: CHEST  2 VIEW  COMPARISON:  05/27/2013; 08/19/2003  FINDINGS: Grossly unchanged cardiac silhouette and mediastinal contours. Labs are slightly reduced with mild elevation of the right hemidiaphragm and slight worsening of bibasilar heterogeneous opacities, left greater than right. Mild cephalization of flow without frank evidence of edema. No definite pleural effusion. No pneumothorax. Unchanged bones including mild scoliotic curvature of the thoracolumbar spine.  IMPRESSION: Bibasilar atelectasis without acute cardiopulmonary disease on this hypoventilated examination.   Electronically Signed   By:  Sandi Mariscal M.D.   On: 06/12/2013 07:43   Dg Abd 1 View  06/12/2013   CLINICAL DATA:  Constipation.  EXAM: ABDOMEN - 1 VIEW  COMPARISON:  Abdominal radiograph 06/11/2013.  FINDINGS: Stool is demonstrated within the cecum and ascending colon. Gas is demonstrated throughout the colon. No dilated loops of small bowel are identified. The upper abdomen and liver excluded from view, limiting evaluation for free intraperitoneal air, lung base pathology and portal venous gas. Lower lumbar spine degenerative change.  IMPRESSION: Nonobstructed bowel gas pattern. Mild amount of stool and gas within the  colon.   Electronically Signed   By: Lovey Newcomer M.D.   On: 06/12/2013 08:14   Dg Abd 1 View  06/11/2013   CLINICAL DATA:  Abdominal distension and obstipation  EXAM: ABDOMEN - 1 VIEW  COMPARISON:  CT abdomen and pelvis February 06, 2008  FINDINGS: There is extensive stool throughout the ascending colon and proximal transverse colon. Stool is moderate elsewhere. There is mild generalized colonic dilatation. There is no appreciable small bowel obstruction. No free air or portal venous air seen on this supine examination. No abnormal calcifications.  IMPRESSION: Mild generalized colonic dilatation. There is copious stool throughout the ascending and proximal transverse colon with moderate stool elsewhere. Question a degree of colonic ileus. Frank bowel obstruction is not seen. No free air.   Electronically Signed   By: Lowella Grip M.D.   On: 06/11/2013 10:03   Ct Angio Chest Pe W/cm &/or Wo Cm  06/12/2013   CLINICAL DATA:  Worsening hypoxia and increased A-a gradient. Clinical suspicion for pulmonary embolism.  EXAM: CT ANGIOGRAPHY CHEST WITH CONTRAST  TECHNIQUE: Multidetector CT imaging of the chest was performed using the standard protocol during bolus administration of intravenous contrast. Multiplanar CT image reconstructions and MIPs were obtained to evaluate the vascular anatomy.  CONTRAST:  135mL OMNIPAQUE IOHEXOL 350 MG/ML SOLN  COMPARISON:  None.  FINDINGS: Pulmonary embolism is seen within the right upper, middle, and lower lobar pulmonary arteries. No pulmonary emboli seen in the left pulmonary arteries.  No evidence of thoracic aortic aneurysm or dissection. No evidence of mediastinal mass or hematoma. No lymphadenopathy identified within the thorax.  Dependent atelectasis is noted bilaterally however there is no evidence of pulmonary consolidation or mass. Central tracheobronchial airways are patent.  Review of the MIP images confirms the above findings.  IMPRESSION: Positive for pulmonary  embolism in right upper, middle, and lower lobar pulmonary arteries.  Mild dependent atelectasis in both lungs.  Critical Value/emergent results were called by telephone at the time of interpretation on 06/12/2013 at 11:28 AM to Dr. Alger Simons , who verbally acknowledged these results.   Electronically Signed   By: Earle Gell M.D.   On: 06/12/2013 11:28    Medications:  I have reviewed the patient's current medications. Scheduled Medications: . irbesartan  75 mg Oral Daily  . iron polysaccharides  150 mg Oral q12n4p  . loratadine  10 mg Oral Daily  . metoCLOPramide  5 mg Oral TID AC & HS  . montelukast  10 mg Oral Daily  . nystatin  5 mL Oral Daily  . nystatin   Topical BID  . OxyCODONE  10 mg Oral Q12H  . pantoprazole  80 mg Oral Daily  . polyethylene glycol  17 g Oral Daily  . potassium chloride  20 mEq Oral TID  . Warfarin - Pharmacist Dosing Inpatient   Does not apply q1800   PRN Medications: acetaminophen, alum  hydroxide-mag trisilicate, bisacodyl, diphenhydrAMINE, fluticasone, guaiFENesin-dextromethorphan, ipratropium-albuterol, menthol-cetylpyridinium, methocarbamol, oxyCODONE, phenol, prochlorperazine, prochlorperazine, prochlorperazine, sodium phosphate, traMADol, traZODone  Assessment/Plan: Active Problems:   OA (osteoarthritis) of knee 1. 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 PE on CTA 06/12/13: pharmacy managing anticoagulation per protocol - -O2 via [place] to keep sats greater than 90%  -pt is comfortable and stable to be on unit  3. Pain Management: on OxyContin CR 10 mg bid for more consistent pain relief  - Will continue prn oxycodone and advised patient to use ice past therapy sessions.  4. Mood: Team to provide ego support to help with anxiety issues. LCSW to follow for evaluation and support.  5. Neuropsych: This patient is capable of making decisions on her own behalf.  6. ABLA: continue iron supplement.   7. HTN:  Monitor BP every 8 hours. Blood pressures on low side therefore will set parameters on Avapro.  8. H/o Seasonal allergies: continue Singulair and Claritin. Encourage IS every hour to help with pulmonary toilet.   9. GERD: Stable on protonix.  10. Constipation: improved  Length of stay, days: 4    Valerie A. Asa Lente, MD 06/13/2013, 7:38 AM

## 2013-06-14 ENCOUNTER — Inpatient Hospital Stay (HOSPITAL_COMMUNITY): Payer: 59 | Admitting: *Deleted

## 2013-06-14 LAB — PROTIME-INR
INR: 3.2 — ABNORMAL HIGH (ref 0.00–1.49)
Prothrombin Time: 31.6 seconds — ABNORMAL HIGH (ref 11.6–15.2)

## 2013-06-14 MED ORDER — WARFARIN SODIUM 2 MG PO TABS
2.0000 mg | ORAL_TABLET | Freq: Once | ORAL | Status: AC
  Administered 2013-06-14: 2 mg via ORAL
  Filled 2013-06-14: qty 1

## 2013-06-14 NOTE — Progress Notes (Signed)
Patient ID: Sharon Daniels, female   DOB: 22-Jul-1950, 63 y.o.   MRN: [id number] Sharon Daniels is a 63 y.o. female 1950-10-27 [id number]  Subjective: No new complaints. Breathing ok and no chest pain. Requests advance diet to solids. Slept well. Feeling OK.  Objective: Vital signs in last 24 hours: Temp:  [98.3 F (36.8 C)-99.3 F (37.4 C)] 98.3 F (36.8 C) (06/07 0604) Pulse Rate:  [82-103] 82 (06/07 0604) Resp:  [18] 18 (06/07 0604) BP: (115-122)/(70-77) 120/70 mmHg (06/07 0604) SpO2:  [91 %-99 %] 99 % (06/07 0604) Weight change:  Last BM Date: 06/12/13  Intake/Output from previous day: 06/06 0701 - 06/07 0700 In: 68 [P.O.:780] Out: -   Physical Exam General: No apparent distress    Lungs: Normal effort. Lungs clear to auscultation, no crackles or wheezes. Cardiovascular: Regular rate and rhythm, no edema Musculoskeletal: 1+ edema around either knee (no changes). neurovasc intact Skin: wounds intact. No drainage or erythema  Neurological: alert and oriented x 3. Cognitively appropriate UE's 5/5. KE and HF limited due to knee pain/surgery--grossly 1+ to 2 respectively. Normal ADF/APF bilaterally. No sensory changes. Dtr;s 1+ Wounds:  Clean, dry, intact. No signs of infection.  Lab Results: BMET    Component Value Date/Time   NA 135* 06/12/2013 0825   K 3.5* 06/12/2013 0825   CL 97 06/12/2013 0825   CO2 26 06/12/2013 0825   GLUCOSE 154* 06/12/2013 0825   BUN 9 06/12/2013 0825   CREATININE 0.55 06/12/2013 0825   CALCIUM 8.3* 06/12/2013 0825   GFRNONAA >90 06/12/2013 0825   GFRAA >90 06/12/2013 0825   CBC    Component Value Date/Time   WBC 7.3 06/10/2013 0510   WBC 7.2 12/12/2005 0843   RBC 2.70* 06/10/2013 0510   RBC 3.64* 12/12/2005 0843   HGB 8.4* 06/10/2013 0510   HGB 11.6 12/12/2005 0843   HCT 25.1* 06/10/2013 0510   HCT 34.5* 12/12/2005 0843   PLT 241 06/10/2013 0510   PLT 269 12/12/2005 0843   MCV 93.0 06/10/2013 0510   MCV 95.0 12/12/2005 0843   MCH 31.1 06/10/2013 0510   MCH  32.0 12/12/2005 0843   MCHC 33.5 06/10/2013 0510   MCHC 33.7 12/12/2005 0843   RDW 15.1 06/10/2013 0510   RDW 14.2 12/12/2005 0843   LYMPHSABS 1.5 06/10/2013 0510   LYMPHSABS 1.8 12/12/2005 0843   MONOABS 0.8 06/10/2013 0510   MONOABS 0.6 12/12/2005 0843   EOSABS 0.3 06/10/2013 0510   EOSABS 0.2 12/12/2005 0843   BASOSABS 0.0 06/10/2013 0510   BASOSABS 0.1 12/12/2005 0843   CBG's (last 3):  No results found for this basename: GLUCAP,  in the last 72 hours LFT's Lab Results  Component Value Date   ALT 15 06/10/2013   AST 20 06/10/2013   ALKPHOS 74 06/10/2013   BILITOT 0.4 06/10/2013    Studies/Results: Ct Angio Chest Pe W/cm &/or Wo Cm  06/12/2013   CLINICAL DATA:  Worsening hypoxia and increased A-a gradient. Clinical suspicion for pulmonary embolism.  EXAM: CT ANGIOGRAPHY CHEST WITH CONTRAST  TECHNIQUE: Multidetector CT imaging of the chest was performed using the standard protocol during bolus administration of intravenous contrast. Multiplanar CT image reconstructions and MIPs were obtained to evaluate the vascular anatomy.  CONTRAST:  119mL OMNIPAQUE IOHEXOL 350 MG/ML SOLN  COMPARISON:  None.  FINDINGS: Pulmonary embolism is seen within the right upper, middle, and lower lobar pulmonary arteries. No pulmonary emboli seen in the left pulmonary arteries.  No evidence of thoracic  aortic aneurysm or dissection. No evidence of mediastinal mass or hematoma. No lymphadenopathy identified within the thorax.  Dependent atelectasis is noted bilaterally however there is no evidence of pulmonary consolidation or mass. Central tracheobronchial airways are patent.  Review of the MIP images confirms the above findings.  IMPRESSION: Positive for pulmonary embolism in right upper, middle, and lower lobar pulmonary arteries.  Mild dependent atelectasis in both lungs.  Critical Value/emergent results were called by telephone at the time of interpretation on 06/12/2013 at 11:28 AM to Dr. Alger Simons , who verbally acknowledged  these results.   Electronically Signed   By: Earle Gell M.D.   On: 06/12/2013 11:28    Medications:  I have reviewed the patient's current medications. Scheduled Medications: . irbesartan  75 mg Oral Daily  . iron polysaccharides  150 mg Oral q12n4p  . loratadine  10 mg Oral Daily  . metoCLOPramide  5 mg Oral TID AC & HS  . montelukast  10 mg Oral Daily  . nystatin  5 mL Oral Daily  . nystatin   Topical BID  . OxyCODONE  10 mg Oral Q12H  . pantoprazole  80 mg Oral Daily  . polyethylene glycol  17 g Oral Daily  . potassium chloride  20 mEq Oral TID  . warfarin  2 mg Oral ONCE-1800  . Warfarin - Pharmacist Dosing Inpatient   Does not apply q1800   PRN Medications: acetaminophen, alum hydroxide-mag trisilicate, bisacodyl, diphenhydrAMINE, fluticasone, guaiFENesin-dextromethorphan, ipratropium-albuterol, menthol-cetylpyridinium, methocarbamol, oxyCODONE, phenol, prochlorperazine, prochlorperazine, prochlorperazine, sodium phosphate, traMADol, traZODone  Assessment/Plan: Active Problems:   OA (osteoarthritis) of knee   Acute pulmonary embolism 1. 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 PE on CTA 06/12/13: pharmacy managing anticoagulation per protocol - -O2 via [place] to keep sats greater than 90%  -pt is comfortable and stable to be on unit  -placed call to on call ortho re: notification of same dx given recent B TKR 06/05/13 3. Pain Management: on OxyContin CR 10 mg bid for more consistent pain relief  - Will continue prn oxycodone and advised patient to use ice past therapy sessions.  4. Mood: Team to provide ego support to help with anxiety issues. LCSW to follow for evaluation and support.  5. Neuropsych: This patient is capable of making decisions on her own behalf.  6. ABLA: continue iron supplement.   7. HTN: Monitor BP every 8 hours. Blood pressures on low side therefore will set parameters on Avapro.  8. H/o Seasonal allergies:  continue Singulair and Claritin. Encourage IS every hour to help with pulmonary toilet.   9. GERD: Stable on protonix.  10. Constipation: improved  Length of stay, days: 5    Zakeria Kulzer A. Asa Lente, MD 06/14/2013, 10:53 AM

## 2013-06-14 NOTE — Progress Notes (Signed)
Orthopedic Tech Progress Note Patient Details:  Sharon Daniels 12-11-1950 [id number]  Patient ID: [name], female   DOB: December 02, 1950, 63 y.o.   MRN: [id number] Placed pt's rle in cpm @ 78 degrees @ 6286  Zong Mcquarrie 06/14/2013, 5:48 PM

## 2013-06-14 NOTE — Progress Notes (Signed)
Physical Therapy Session Note  Patient Details  Name: Sharon Daniels MRN: [id number] Date of Birth: 1950-12-20  Today's Date: 06/14/2013 Time: 8588-5027 Time Calculation (min): 60 min  Short Term Goals: Week 1:  PT Short Term Goal 1 (Week 1): = LTGs  Skilled Therapeutic Interventions/Progress Updates:  Pt was transferred supine to edge of bed with side rail and S with increased time. Pt transferred edge of bed to standing with bed height increased with S and rolling walker. Pt ambulated to w/c about 10 feet with rolling walker and S. Pt propelled w/c with B UEs about 200 feet with S and occasional rest breaks. Pt performed knee flexion, LAQs, SAQs, hip abd/add, quad sets, 3 setsx 10 reps each. Pt ambulated with rolling walker and S, 50 feet x 2 with verbal cues for technique. Pt ambulates with decreased step length and slow cadence. Pt returned to room propelled w/c with B UEs and S.   Therapy Documentation Precautions:  Precautions Precautions: Fall Precaution Comments: WBAT bilaterally Required Braces or Orthoses:  (D/c KI's BLE as tolerated) Knee Immobilizer - Right: Discontinue once straight leg raise with < 10 degree lag Knee Immobilizer - Left: Discontinue once straight leg raise with < 10 degree lag Restrictions Weight Bearing Restrictions: No RLE Weight Bearing: Weight bearing as tolerated LLE Weight Bearing: Weight bearing as tolerated General:   Pain: No c/o pain.    Locomotion : Ambulation Ambulation/Gait Assistance: 5: Supervision   See FIM for current functional status  Therapy/Group: Individual Therapy  Dub Amis 06/14/2013, 2:54 PM

## 2013-06-14 NOTE — Progress Notes (Signed)
Occupational Therapy Session Note  Patient Details  Name: Sharon Daniels MRN: [id number] Date of Birth: [birth year]  Today's Date: 06/14/2013 Time:  - 0730-0900  (75 min)  1st session    Short Term Goals: Week 1:  OT Short Term Goal 1 (Week 1): Short Term Goals = Long Term Goals seconary to ELOS  Skilled Therapeutic Interventions/Progress Updates:    1st session:  Addressed [facility] LEVEL.  Focus on bed mobility, functional movement, transfers, sit to stand from various surfaces.  Pt. Went from supine to sit with SBA.  Raised bed to about 24 inches and pt did sit to stand.  Instructed pt to push from bed surface and not walker arms.  Pt. Ambulated to toilet and sat on elevated seat. Transferred to shower seat and bathed.  Did lateral leans for peri care.  Pt. Dressed EOB with AE instruction.  Ambulated to sink and performed grooming.      \Time: 0900-0950  (50 min)  2nd session Pain:5/10 with movement Individual session O2 sats=89 %.  Re instructed on pursed lip breathing.  Pt. Removed oxgen during walking.  Ambulated from room to OT kitchen.  Pt. O2= 95%.  Pt prepared SIMPLE MEAL USING COOKTOP.  Went over walker safety, passing objects from refrigerator to counter.  Pt. Needed stool to sit while stirring food.  Pt has stool she can use at home for energy conservation strategies.  Pt. Engaged in functional mobility for 1 5 minutes before resting.  Used wc to go back to room.  Pt. Transferred to bed with assistance of lifting right leg.  Remained in bed with call bell,phone within reach.   Therapy Documentation Precautions:  Precautions Precautions: Fall Precaution Comments: WBAT bilaterally Required Braces or Orthoses:  (D/c KI's BLE as tolerated) Knee Immobilizer - Right: Discontinue once straight leg raise with < 10 degree lag Knee Immobilizer - Left: Discontinue once straight leg raise with < 10 degree lag Restrictions Weight Bearing Restrictions: No RLE Weight Bearing:  Weight bearing as tolerated LLE Weight Bearing: Weight bearing as tolerated       Pain:  1st session  4/10     See FIM for current functional status  Therapy/Group: Individual Therapy  Lisa Roca 06/14/2013, 10:08 AM

## 2013-06-14 NOTE — Progress Notes (Signed)
ANTICOAGULATION CONSULT NOTE - Follow Up Consult  Pharmacy Consult for coumadin Indication: Pulmonary Embolus seen on CT/angio 06/12/13  Allergies  Allergen Reactions  . Morphine Itching    REACTION: rash, IV    Patient Measurements: Height: 5\' 8"  (172.7 cm) Weight: 219 lb 9.3 oz (99.6 kg) IBW/kg (Calculated) : 63.9 Heparin Dosing Weight:   Vital Signs: Temp: 98.3 F (36.8 C) (06/07 0604) Temp src: Oral (06/07 0604) BP: 120/70 mmHg (06/07 0604) Pulse Rate: 82 (06/07 0604)  Labs:  Recent Labs  06/12/13 0825 06/13/13 0525 06/14/13 0505  LABPROT 28.1* 29.0* 31.6*  INR 2.74* 2.86* 3.20*  CREATININE 0.55  --   --     Estimated Creatinine Clearance: 91.2 ml/min (by C-G formula based on Cr of 0.55).   Medications:  Scheduled:  . irbesartan  75 mg Oral Daily  . iron polysaccharides  150 mg Oral q12n4p  . loratadine  10 mg Oral Daily  . metoCLOPramide  5 mg Oral TID AC & HS  . montelukast  10 mg Oral Daily  . nystatin  5 mL Oral Daily  . nystatin   Topical BID  . OxyCODONE  10 mg Oral Q12H  . pantoprazole  80 mg Oral Daily  . polyethylene glycol  17 g Oral Daily  . potassium chloride  20 mEq Oral TID  . Warfarin - Pharmacist Dosing Inpatient   Does not apply q1800   Infusions:  . sodium chloride 75 mL/hr (06/13/13 2036)    Assessment: 63 yo female is currently on therapeutic coumadin for r/o PE (new on 06/05) and was originally for VTE prophylaxis s/p ortho surgery.  INR today is up to 3.2 which is slightly above the goal of 2-3. Prophylactic-dose LMWH was started by ortho on 5/31 twelve hours after removal of the epidural catheter and was d/c'ed on 06/04 due to therapeutic INR. CT angio now positive for PE.  Goal of Therapy:  INR 2-3 Monitor platelets by anticoagulation protocol: Yes   Plan:  Coumadin 2 mg PO x1 Daily INR while inpatient   Gearldine Bienenstock Tiziana Cislo 06/14/2013,9:47 AM

## 2013-06-14 NOTE — Progress Notes (Signed)
Orthopedic Tech Progress Note Patient Details:  Sharon Daniels [birth year] [id number]  Patient ID: Sharon Daniels, female   DOB: [birth year], 63 y.o.   MRN: [id number] Placed pt's lle in cpm @ 0-78 degrees @ 2015  Irfan Veal 06/14/2013, 8:15 PM

## 2013-06-15 ENCOUNTER — Inpatient Hospital Stay (HOSPITAL_COMMUNITY): Payer: 59

## 2013-06-15 ENCOUNTER — Inpatient Hospital Stay (HOSPITAL_COMMUNITY): Payer: 59 | Admitting: Physical Therapy

## 2013-06-15 ENCOUNTER — Encounter (HOSPITAL_COMMUNITY): Payer: 59 | Admitting: Occupational Therapy

## 2013-06-15 LAB — BASIC METABOLIC PANEL
BUN: 8 mg/dL (ref 6–23)
CO2: 27 meq/L (ref 19–32)
CREATININE: 0.61 mg/dL (ref 0.50–1.10)
Calcium: 8.8 mg/dL (ref 8.4–10.5)
Chloride: 102 mEq/L (ref 96–112)
GFR calc non Af Amer: >90 mL/min (ref >90)
Glucose, Bld: 107 mg/dL — ABNORMAL HIGH (ref 70–99)
POTASSIUM: 4.3 meq/L (ref 3.7–5.3)
SODIUM: 139 meq/L (ref 137–147)

## 2013-06-15 LAB — PROTIME-INR
INR: 2.57 — ABNORMAL HIGH (ref 0.00–1.49)
PROTHROMBIN TIME: 26.7 s — AB (ref 11.6–15.2)

## 2013-06-15 MED ORDER — WARFARIN SODIUM 5 MG PO TABS
5.0000 mg | ORAL_TABLET | Freq: Once | ORAL | Status: AC
  Administered 2013-06-15: 5 mg via ORAL
  Filled 2013-06-15: qty 1

## 2013-06-15 NOTE — Progress Notes (Signed)
Physical Therapy Session Note  Patient Details  Name: Sharon Daniels MRN: [id number] Date of Birth: [birth year]  Today's Date: 06/15/2013 Time: 1003 - 1105 (62 min) & 1416 - 1508 (52 min)    Short Term Goals: Week 1:  PT Short Term Goal 1 (Week 1): = LTGs Week 2: PT Short Term Goal 1 (Week 2): = LTGs  Skilled Therapeutic Interventions/Progress Updates:  TED hose donned for both AM and PM Tx sessions.  AM Session:  Therapeutic Activity: Car transfer with RW and verbal cues for technique. Multiple sit<>stand transfers from W/C and Mat table with min guard for safety.  W/C mobility: strengthening of BUE for >50' x 2 to prevent deconditioning with verbal cues for technique. Pt. Unable to donn/doff elevating leg rests independently, requires max A.  Gait: 1' with RW @ S with verbal cues for step height and knee flexion. 44' with Rolator @ supervision with verbal cues for step height. Pt. Demonstrated understanding and use of rolator in preparation of using hers. Advised use of RW for community ambulation due to stability and and UE support requirement at present time. Pt. Given verbal cues and made aware of increased proprioception needs due to surgery; encouraged to mildly march with attention given to foot/knee/hip position.  Self-care: static and dynamic balance trials at sink with RW to dry/comb hair @ supervision for safety. Pt. Able to maintain balance while crossing midline and alternating hand use above shoulder.  Pt. Pain: see below.   PM Session:  Therapeutic Activity: 4 steps x 2 with 2 rails min A with verbal cues for technique and safety. Lateral stepping L<>R 4 x 10' with verbal cues for step width and posture. Pt. Performed multiple sit<>stand from variable surface heights and degrees of compliance for increased awareness and applicable techniques in standing/sitting.   Gait: Pt. Performed multiple 180 degree turns with cues for safety and for proper technique/sequencing  of foot placement to avoid increased pain. Ambulated with RW >100' with cues for knee flexion/step height. Pt. Required one therapeutic rest/recovery break. Pt. Ambulated around obstacles in gym to simulate home environment with education about trip hazards, pets, and various household items. Pt. Performed retro walking with RW at S for increased home and social environment ambulation technique stressed using verbal cues, tactile cues, and demonstration by therapist.  Pt. Pain: see below.  Therapy Documentation Precautions:  Precautions Precautions: Fall Precaution Comments: WBAT bilaterally Required Braces or Orthoses:  (D/c KI's BLE as tolerated) Knee Immobilizer - Right: Discontinue once straight leg raise with < 10 degree lag Knee Immobilizer - Left: Discontinue once straight leg raise with < 10 degree lag Restrictions Weight Bearing Restrictions: No RLE Weight Bearing: Weight bearing as tolerated LLE Weight Bearing: Weight bearing as tolerated  Pain:  AM Session: 5/10 bilat knees. Subsides with rest, RN notified.  PM Session: 6-7/10 R knee. L knee unrated "not bad". RN notified.  See FIM for current functional status  Therapy/Group: Individual Therapy  Juluis Mire 06/15/2013, 8:53 AM

## 2013-06-15 NOTE — Progress Notes (Signed)
Occupational Therapy Note  Patient Details  Name: Sharon Daniels MRN: [id number] Date of Birth: November 21, 1950 Today's Date: 06/15/2013  Time:1335-1405 Pt c/o 3/10 pain in bilateral knees; RN aware and premedicated prior to therapy Individual Therapy  Pt engaged in simple meal prep activities in kitchen.  Pt educated on RW safety in kitchen and issued a small walker bag to use after discharge.  Problem solved how to transport items in kitchen when there is a gap in counter space.  Pt independently determined safe and appropriate solution.  Pt amb with RW from ADL kitchen to room and sat in recliner to aware next therapy.  Focus on kitchen safety, RW safety, functional amb with RW for home mgmt tasks, activity tolerance and ongoing discharge planning.    Leroy Libman 06/15/2013, 2:45 PM

## 2013-06-15 NOTE — Progress Notes (Signed)
ANTICOAGULATION CONSULT NOTE - Follow Up Consult  Pharmacy Consult for Coumadin Indication: pulmonary embolus  Allergies  Allergen Reactions  . Morphine Itching    REACTION: rash, IV    Patient Measurements: Height: 5\' 8"  (172.7 cm) Weight: 219 lb 9.3 oz (99.6 kg) IBW/kg (Calculated) : 63.9 Heparin Dosing Weight:   Vital Signs: Temp: 98.9 F (37.2 C) (06/08 0400) Temp src: Oral (06/08 0400) BP: 109/70 mmHg (06/08 0400) Pulse Rate: 91 (06/08 0400)  Labs:  Recent Labs  06/13/13 0525 06/14/13 0505 06/15/13 0345  LABPROT 29.0* 31.6* 26.7*  INR 2.86* 3.20* 2.57*  CREATININE  --   --  0.61    Estimated Creatinine Clearance: 91.2 ml/min (by C-G formula based on Cr of 0.61).   Medications:  Scheduled:  . irbesartan  75 mg Oral Daily  . iron polysaccharides  150 mg Oral q12n4p  . loratadine  10 mg Oral Daily  . metoCLOPramide  5 mg Oral TID AC & HS  . montelukast  10 mg Oral Daily  . nystatin  5 mL Oral Daily  . nystatin   Topical BID  . OxyCODONE  10 mg Oral Q12H  . pantoprazole  80 mg Oral Daily  . polyethylene glycol  17 g Oral Daily  . potassium chloride  20 mEq Oral TID  . Warfarin - Pharmacist Dosing Inpatient   Does not apply q1800    Assessment: 63yo female with (+)PE, on Coumadin with therapeutic INR.  Dose was reduced on 6/7 for elevated INR, wnl this AM at 2.57.  No bleeding noted.  Goal of Therapy:  INR 2-3 Monitor platelets by anticoagulation protocol: Yes   Plan:  1-  Coumadin 5mg  2-  Continue daily INR  Gracy Bruins, PharmD Clinical Pharmacist [facility]

## 2013-06-15 NOTE — Progress Notes (Signed)
[place] PHYSICAL MEDICINE & REHABILITATION     PROGRESS NOTE    Subjective/Complaints: Feeling better. Wants to do more for herself A 12 point review of systems has been performed and if not noted above is otherwise negative.   Objective: Vital Signs: Blood pressure 109/70, pulse 91, temperature 98.9 F (37.2 C), temperature source Oral, resp. rate 18, height 5\' 8"  (1.727 m), weight 99.6 kg (219 lb 9.3 oz), SpO2 93.00%. No results found. No results found for this basename: WBC, HGB, HCT, PLT,  in the last 72 hours  Recent Labs  06/12/13 0825 06/15/13 0345  NA 135* 139  K 3.5* 4.3  CL 97 102  GLUCOSE 154* 107*  BUN 9 8  CREATININE 0.55 0.61  CALCIUM 8.3* 8.8   CBG (last 3)  No results found for this basename: GLUCAP,  in the last 72 hours  Wt Readings from Last 3 Encounters:  06/10/13 99.6 kg (219 lb 9.3 oz)  06/05/13 95.255 kg (210 lb)  06/05/13 95.255 kg (210 lb)    Physical Exam:  Constitutional: She appears well-developed.  HENT:  Head: Normocephalic and atraumatic.  Eyes: Conjunctivae are normal. Pupils are equal, round, and reactive to light.  Neck: No JVD present. No tracheal deviation present. No thyromegaly present.  Cardiovascular: Normal rate. No murmur  Respiratory: No respiratory distress.  GI: She exhibits less distension. Bowel sounds improved Musculoskeletal:  1+ edema around either knee (no changes).  Skin: wounds intact. No drainage or erythema Neurological: alert and oriented x 3. Cognitively appropriate UE's 5/5. KE and HF limited due to knee pain/surgery--grossly 1+ to 2 respectively. Normal ADF/APF bilaterally. No sensory changes. Dtr;s 1+ Psychiatric: She has a normal mood and affect. Her behavior is normal. Thought content normal   Assessment/Plan: 1. 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 Physiatrist is providing close team supervision and 24 hour management of active medical problems listed below. Physiatrist and rehab team continue to assess barriers to discharge/monitor patient progress toward functional and medical goals. FIM: FIM - Bathing Bathing Steps Patient Completed: Chest;Right Arm;Left Arm;Abdomen;Front perineal area;Buttocks;Right upper leg;Left upper leg;Right lower leg (including foot);Left lower leg (including foot) (used LH sponge) Bathing: 5: Set-up assist to: Adjust water temp  FIM - Upper Body Dressing/Undressing Upper body dressing/undressing steps patient completed: Thread/unthread right sleeve of pullover shirt/dresss;Thread/unthread left sleeve of pullover shirt/dress;Put head through opening of pull over shirt/dress;Pull shirt over trunk Upper body dressing/undressing: 5: Set-up assist to: Obtain clothing/put away FIM - Lower Body Dressing/Undressing Lower body dressing/undressing steps patient completed: Pull pants up/down;Thread/unthread left pants leg;Thread/unthread right pants leg;Don/Doff left sock;Don/Doff right sock Lower body dressing/undressing: 4: Min-Patient completed 75 plus % of tasks  FIM - Toileting Toileting steps completed by patient: Performs perineal hygiene;Adjust clothing after toileting;Adjust clothing prior to toileting Toileting Assistive Devices: Grab bar or rail for support Toileting: 0: Activity did not occur  FIM - Radio producer Devices: Walker;Elevated toilet seat Toilet Transfers: 5-To toilet/BSC: Supervision (verbal cues/safety issues);5-From toilet/BSC: Supervision (verbal cues/safety issues)  FIM - Control and instrumentation engineer Devices: Bed rails Bed/Chair Transfer: 5: Supine > Sit: Supervision (verbal cues/safety issues);4: Sit > Supine: Min A (steadying pt. > 75%/lift 1 leg);5: Bed > Chair or W/C: Supervision (verbal cues/safety issues) (elevated  surface)  FIM - Locomotion: Wheelchair Locomotion: Wheelchair: 5: Travels 150 ft or more: maneuvers on rugs and  over door sills with supervision, cueing or coaxing FIM - Locomotion: Ambulation Locomotion: Ambulation Assistive Devices: Walker - Rolling Ambulation/Gait Assistance: 5: Supervision Locomotion: Ambulation: 2: Travels 31 - 149 ft with supervision/safety issues  Comprehension Comprehension Mode: Auditory Comprehension: 7-Follows complex conversation/direction: With no assist  Expression Expression Mode: Verbal Expression: 7-Expresses complex ideas: With no assist  Social Interaction Social Interaction: 7-Interacts appropriately with others - No medications needed.  Problem Solving Problem Solving: 7-Solves complex problems: Recognizes & self-corrects  Memory Memory: 7-Complete Independence: No helper Medical Problem List and Plan:  1. 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 DVT/PE: coumadin therapeutic yesterday, CTA+ for right sided PE's  -improved symptomatically  -O2 via  to keep sats greater than 90% 3. Pain Management: added OxyContin CR 10 mg bid for more consistent pain relief   - Will continue prn oxycodone and advised patient to use ice past therapy sessions.  4. Mood: Team to provide ego support to help with anxiety issues. LCSW to follow for evaluation and support.  5. Neuropsych: This patient is capable of making decisions on her own behalf.  6. ABLA:continue iron supplement. Recheck hgb this week. 7. HTN: Monitor BP every 8 hours. Blood pressures on low side therefore will set parameters on Avapro.  8. H/o Seasonal allergies: continue Singulair and Claritin. Encourage IS every hour to help with pulmonary toilet. Wean oxygen as able  9. GERD: Stable on protonix.  10. Constipation:  -improving but stools loose yesterday  -diet advanced to regular which should help with stool consistency    LOS (Days) 6 A FACE TO  FACE EVALUATION WAS PERFORMED  Meredith Staggers 06/15/2013 7:41 AM

## 2013-06-15 NOTE — Progress Notes (Signed)
Occupational Therapy Session Note  Patient Details  Name: Sharon Daniels MRN: [id number] Date of Birth: Jan 10, 1950  Today's Date: 06/15/2013 Time: 7948-0165 Time Calculation (min): 55 min   Short Term Goals: Week 1:  OT Short Term Goal 1 (Week 1): Short Term Goals = Long Term Goals seconary to ELOS  Skilled Therapeutic Interventions/Progress Updates:  Patient received supine in bed asleep. Patient easy to awake and arouse, patient with no complaints of pain. Patient engaged in bed mobility at mod I level. Stood with RW (height of bed at lowest level) at mod I level and ambulated into bathroom for toilet transfer, toileting , then shower stall transfer. Patient supervision>mod I for all ADL tasks at this time. Patient utilizes AE appropriately and prn to increase her overall independence. Patient eager to discharge > home soon. Therapist donned bilateral thigh-high TEDs and left patient seated in w/c with all needs within reach.   Precautions:  Precautions Precautions: Fall Precaution Comments: WBAT bilaterally Required Braces or Orthoses:  (D/c KI's BLE as tolerated) Knee Immobilizer - Right: Discontinue once straight leg raise with < 10 degree lag Knee Immobilizer - Left: Discontinue once straight leg raise with < 10 degree lag Restrictions Weight Bearing Restrictions: No RLE Weight Bearing: Weight bearing as tolerated LLE Weight Bearing: Weight bearing as tolerated  See FIM for current functional status  Therapy/Group: Individual Therapy  Estelle June 06/15/2013, 9:35 AM

## 2013-06-16 ENCOUNTER — Encounter (HOSPITAL_COMMUNITY): Payer: 59 | Admitting: Occupational Therapy

## 2013-06-16 ENCOUNTER — Inpatient Hospital Stay (HOSPITAL_COMMUNITY): Payer: 59 | Admitting: Physical Therapy

## 2013-06-16 ENCOUNTER — Inpatient Hospital Stay (HOSPITAL_COMMUNITY): Payer: 59

## 2013-06-16 ENCOUNTER — Inpatient Hospital Stay (HOSPITAL_COMMUNITY): Payer: 59 | Admitting: Occupational Therapy

## 2013-06-16 LAB — PROTIME-INR
INR: 2.21 — AB (ref 0.00–1.49)
Prothrombin Time: 23.8 seconds — ABNORMAL HIGH (ref 11.6–15.2)

## 2013-06-16 LAB — CBC
HCT: 25.9 % — ABNORMAL LOW (ref 36.0–46.0)
HEMOGLOBIN: 8.3 g/dL — AB (ref 12.0–15.0)
MCH: 29.5 pg (ref 26.0–34.0)
MCHC: 32 g/dL (ref 30.0–36.0)
MCV: 92.2 fL (ref 78.0–100.0)
PLATELETS: 476 10*3/uL — AB (ref 150–400)
RBC: 2.81 MIL/uL — AB (ref 3.87–5.11)
RDW: 14.4 % (ref 11.5–15.5)
WBC: 8.4 10*3/uL (ref 4.0–10.5)

## 2013-06-16 MED ORDER — SORBITOL 70 % SOLN
60.0000 mL | Status: AC
  Administered 2013-06-16: 60 mL via ORAL
  Filled 2013-06-16: qty 60

## 2013-06-16 MED ORDER — POLYETHYLENE GLYCOL 3350 17 G PO PACK
17.0000 g | PACK | Freq: Two times a day (BID) | ORAL | Status: DC
  Filled 2013-06-16 (×4): qty 1

## 2013-06-16 MED ORDER — POTASSIUM CHLORIDE CRYS ER 20 MEQ PO TBCR
20.0000 meq | EXTENDED_RELEASE_TABLET | Freq: Two times a day (BID) | ORAL | Status: DC
Start: 2013-06-17 — End: 2013-06-17
  Administered 2013-06-17: 20 meq via ORAL
  Filled 2013-06-16 (×3): qty 1

## 2013-06-16 MED ORDER — WARFARIN SODIUM 5 MG PO TABS
5.0000 mg | ORAL_TABLET | Freq: Once | ORAL | Status: AC
  Administered 2013-06-16: 5 mg via ORAL
  Filled 2013-06-16: qty 1

## 2013-06-16 NOTE — Progress Notes (Addendum)
Physical Therapy Session Note  Patient Details  Name: Sharon Daniels MRN: [id number] Date of Birth: [birth year]  Today's Date: 06/16/2013 Time: 0830-0930 Time Calculation (min): 60 min  Short Term Goals: Week 1:  PT Short Term Goal 1 (Week 1): = LTGs  Skilled Therapeutic Interventions/Progress Updates:   Self-Care:  Pt. Education provided for safety and practical strategies for home tasks, environmental mobilty, and balance strategies with RW or other support. Pt. Performed donning of socks and shoes independently. Ambulated 10' to perform hair and hygiene at sink. Pt. demonstrated skilled turns (180 degrees) with RW.   Transfers: multiple supine<>sit, sit<>stand with and without arm rests at Mod I. Pt. Provided verbal cues for safety and educated on the need for controled up/down velocities only when fatigued, otherwise Mod I. Pt. Verbalized understanding and stated "I'm just tired." as to reason for decreased control.  TKA HEP handout given; therapist demonstrated and explained exercises. Pt. Able to verbalize and demonstrate understanding. Pt. And therapist discused frequency, repetition, and appropriateness of exercises and when to modify and adjust exercises. CIR number provided if she requires carnification or has any additional questions. Assisted pt. With donning of TED hose. Pt. Made mod I without restrictions in room. Pt. Verbalized understanding to call nursing for hallway ambulation or if she felt unsteady/needed support.  Therapy Documentation Precautions:  Precautions Precautions: Fall Precaution Comments: WBAT bilaterally Required Braces or Orthoses:  (d/c KI's as tolerated per PT) Knee Immobilizer - Right: Discontinue once straight leg raise with < 10 degree lag Knee Immobilizer - Left: Discontinue once straight leg raise with < 10 degree lag Restrictions Weight Bearing Restrictions: Yes RLE Weight Bearing: Weight bearing as tolerated LLE Weight Bearing: Weight  bearing as tolerated  Pain: Pain Assessment Pain Assessment: 0-10 Pain Score: 5  Pain Type: Acute pain Pain Location: Knee Pain Orientation: Right;Left;Anterior;Posterior Pain Descriptors / Indicators: Aching;Burning;Sharp Pain Onset: On-going Patients Stated Pain Goal: 2 Pain Intervention(s): Rest;Repositioned;RN made aware;Relaxation Multiple Pain Sites: Yes 2nd Pain Site Pain Score: 5 Pain Type: Acute pain Pain Location: Knee Pain Orientation: Right;Left;Anterior;Posterior Pain Descriptors / Indicators: Aching;Sharp;Burning Pain Frequency: Constant Pain Onset: On-going Patient's Stated Pain Goal: 2 Pain Intervention(s): RN made aware;Rest;Relaxation;Repositioned Mobility: Transfers Transfers: Yes Stand Pivot Transfers: 6: Modified independent (Device/Increase time) Locomotion : Ambulation Ambulation/Gait Assistance: 6: Modified independent (Device/Increase time) Ambulation Distance (Feet): 100 Feet Assistive device: Rolling walker Gait Gait Pattern: Step-through pattern;Decreased step length - right;Decreased step length - left;Decreased hip/knee flexion - right;Decreased hip/knee flexion - left;Decreased stride length Stairs / Additional Locomotion Stairs Assistance: 6: Modified independent (Device/Increase time) Wheelchair Mobility Wheelchair Mobility: Yes    See FIM for current functional status  Therapy/Group: Individual Therapy  Juluis Mire 06/16/2013, 11:03 AM

## 2013-06-16 NOTE — Progress Notes (Signed)
Occupational Therapy Session Note & Discharge Summary  Patient Details  Name: Sharon Daniels MRN: [id number] Date of Birth: 12/08/1950  Today's Date: 06/16/2013  SESSION NOTES  Session #1 1007-1219 - 36 Minutes Individual Therapy No complaints of pain *Grad Day! Patient received supine in bed. Patient engaged in bed mobility and performed ADL at overall mod I level using AD and AE appropriately and prn. Patient eager to go home tomorrow and feels ready.   Session #2 1430-1500 - 30 Minutes Individual Therapy No complaints of pain Patient received supine in bed with husband present at bedside. Focus of skilled intervention on family education. Patient made mod I in room. Patient engaged in bed mobility, stood with RW, and transferred > w/c. Patient propelled self from room > ADL apartment. Patient performed simulated shower stall transfer using RW and shower seat. Recommend patient perform "dry run" walk-in shower transfers at home prior to performing actual shower. Patient independent to perform a simple HEP for BUEs using dumbbells. Patient propelled self back to room.   -----------------------------------------------------------------------------------------------------------------------  DISCHARGE SUMMARY Patient has met 11 of 11 long term goals due to improved activity tolerance, improved balance, postural control, ability to compensate for deficits, functional use of  RIGHT lower and LEFT lower extremity, improved attention, improved awareness and improved coordination.  Patient to discharge at overall Modified Independent level.  Patient's care partner is independent to provide the necessary supervision prn at discharge.    Reasons goals not met: n/a, all goals met at this time.   Recommendation: No additional occupational therapy recommended at this time.   Equipment: Recommend a wide BSC and wide shower seat  Reasons for discharge: treatment goals met and discharge from  hospital  Patient/family agrees with progress made and goals achieved: Yes  Precautions/Restrictions  Precautions Precautions: Fall Precaution Comments: WBAT bilaterally Required Braces or Orthoses:  (d/c KI's as tolerated per PT) Restrictions Weight Bearing Restrictions: Yes RLE Weight Bearing: Weight bearing as tolerated LLE Weight Bearing: Weight bearing as tolerated  Vital Signs Therapy Vitals Temp: 98.5 F (36.9 C) Temp src: Oral Resp: 20 Patient Position (if appropriate): Lying Oxygen Therapy SpO2: 91 % O2 Device: None (Room air)  ADL - see FIM ADL Equipment Provided: Reacher;Sock aid;Long-handled shoe horn;Long-handled sponge  Vision/Perception  Vision- History Baseline Vision/History: Wears glasses Wears Glasses: At all times Patient Visual Report: No change from baseline Vision- Assessment Vision Assessment?: No apparent visual deficits   Cognition Overall Cognitive Status: Within Functional Limits for tasks assessed Orientation Level: Oriented X4 Memory: Appears intact Awareness: Appears intact Problem Solving: Appears intact Safety/Judgment: Appears intact Comments: patient's anxiety has decreased since evaluation  Sensation Sensation Light Touch: Appears Intact Proprioception: Appears Intact Additional Comments: BUEs appear intact Coordination Gross Motor Movements are Fluid and Coordinated: Yes Fine Motor Movements are Fluid and Coordinated: Yes  Motor  Motor Motor: Within Functional Limits  Mobility Mod I; see PT discharge summary for more information  Trunk/Postural Assessment - WFL; see PT discharge summary for more information  Balance - Mod I; see PT discharge summary for more information  Extremity/Trunk Assessment RUE Assessment RUE Assessment: Within Functional Limits LUE Assessment LUE Assessment: Within Functional Limits  See FIM for current functional status  Estelle June 06/16/2013, 3:09 PM

## 2013-06-16 NOTE — Progress Notes (Signed)
Physical Therapy Discharge Summary  Patient Details  Name: Sharon Daniels MRN: [id number] Date of Birth: 1950-02-08  Today's Date: 06/16/2013  Patient has met 9 of 9 long term goals due to improved activity tolerance, improved balance, increased strength, increased range of motion, decreased pain and functional use of  right lower extremity and left lower extremity.  Patient to discharge at an ambulatory level Modified Independent.   Patient's care partner is independent to provide the necessary supervision with intermittent min assistance for stairs and car transfer at discharge.  Pt will also have the assistance of her daughter for one week at D/C.   Reasons goals not met: All goals met  Recommendation:  Patient will benefit from ongoing skilled PT services in home health setting to continue to advance safe functional mobility, address ongoing impairments in bilat LE pain, decreased AROM and PROM, strength, endurance, balance, gait, and minimize fall risk.  Equipment: RW  Reasons for discharge: treatment goals met and discharge from hospital  Patient/family agrees with progress made and goals achieved: Yes  PT Discharge Vital Signs Therapy Vitals Temp: 98.1 F (36.7 C) Temp src: Oral Pulse Rate: 94 Resp: 17 BP: 134/84 mmHg Patient Position (if appropriate): Lying Oxygen Therapy SpO2: 94 % O2 Device: None (Room air) Sensation Sensation Light Touch: Appears Intact Stereognosis: Appears Intact Proprioception: Appears Intact Coordination Gross Motor Movements are Fluid and Coordinated: Yes Fine Motor Movements are Fluid and Coordinated: Yes Motor  Motor Motor - Discharge Observations: Generalized weakness and decreased ROM  Mobility Bed Mobility Bed Mobility: Supine to Sit;Sit to Supine Supine to Sit: 6: Modified independent (Device/Increase time) Sitting - Scoot to Edge of Bed: 6: Modified independent (Device/Increase time) Sit to Supine: 6: Modified independent  (Device/Increase time) Transfers Transfers: Yes Stand Pivot Transfers: 6: Modified independent (Device/Increase time) Locomotion  Ambulation Ambulation/Gait Assistance: 6: Modified independent (Device/Increase time) Ambulation Distance (Feet): 100 Feet Assistive device: Rolling walker Gait Gait Pattern: Step-through pattern;Decreased step length - right;Decreased step length - left;Decreased hip/knee flexion - right;Decreased hip/knee flexion - left;Decreased stride length Stairs / Additional Locomotion Stairs Assistance: 5: Supervision Stair Management Technique: No rails;Step to pattern Number of Stairs: 2 Wheelchair Mobility Wheelchair Mobility: Yes Wheelchair Assistance: 6: Modified independent (Device/Increase time) Environmental health practitioner: Both upper extremities Wheelchair Parts Management: Needs assistance Distance: 100  Trunk/Postural Assessment  Cervical Assessment Cervical Assessment: Within Functional Limits Thoracic Assessment Thoracic Assessment: Within Functional Limits Lumbar Assessment Lumbar Assessment: Within Functional Limits Postural Control Postural Control: Within Functional Limits  Balance Static Sitting Balance Static Sitting - Level of Assistance: 7: Independent Dynamic Sitting Balance Dynamic Sitting - Level of Assistance: 7: Independent Static Standing Balance Static Standing - Level of Assistance: 6: Modified independent (Device/Increase time) Dynamic Standing Balance Dynamic Standing - Level of Assistance: 6: Modified independent (Device/Increase time) Extremity Assessment  RLE Assessment RLE Assessment: Exceptions to [facility] RLE AROM (degrees) RLE Overall AROM Comments: AAROM 78 degrees knee flexion in sitting; ankle WFL RLE Strength RLE Overall Strength Comments:  3-/5 at hip and knee; ankle WFL LLE Assessment LLE Assessment: Exceptions to WFL LLE AROM (degrees) LLE Overall AROM Comments: 4-/5 at hip and knee; ankle WFL LLE Strength LLE  Overall Strength Comments: AAROM knee flexion 87 degrees in supine; ankle [facility]  See FIM for current functional status  Malachy Mood 06/16/2013, 4:57 PM

## 2013-06-16 NOTE — Progress Notes (Signed)
ANTICOAGULATION CONSULT NOTE - Follow Up Consult  Pharmacy Consult for Coumadin Indication: pulmonary embolus  Allergies  Allergen Reactions  . Morphine Itching    REACTION: rash, IV    Patient Measurements: Height: 5\' 8"  (172.7 cm) Weight: 219 lb 9.3 oz (99.6 kg) IBW/kg (Calculated) : 63.9 Heparin Dosing Weight:   Vital Signs: Temp: 98.5 F (36.9 C) (06/09 0604) Temp src: Oral (06/09 0604)  Labs:  Recent Labs  06/14/13 0505 06/15/13 0345 06/16/13 0540  HGB  --   --  8.3*  HCT  --   --  25.9*  PLT  --   --  476*  LABPROT 31.6* 26.7* 23.8*  INR 3.20* 2.57* 2.21*  CREATININE  --  0.61  --     Estimated Creatinine Clearance: 91.2 ml/min (by C-G formula based on Cr of 0.61).   Medications:  Scheduled:  . irbesartan  75 mg Oral Daily  . iron polysaccharides  150 mg Oral q12n4p  . loratadine  10 mg Oral Daily  . metoCLOPramide  5 mg Oral TID AC & HS  . montelukast  10 mg Oral Daily  . nystatin  5 mL Oral Daily  . nystatin   Topical BID  . OxyCODONE  10 mg Oral Q12H  . pantoprazole  80 mg Oral Daily  . polyethylene glycol  17 g Oral Daily  . potassium chloride  20 mEq Oral TID  . Warfarin - Pharmacist Dosing Inpatient   Does not apply q1800    Assessment: 63yo female s/p B-TKA 5/29 with (+)PE 6/5.  INR 2.21 this AM and CBC wnl.  No bleeding noted.  INR has drifted down some, reflecting reduced dose on 6/7.  All doses have been given.  Goal of Therapy:  INR 2-3 Monitor platelets by anticoagulation protocol: Yes   Plan:  1-  Coumadin 5mg  2-  Cont daily INR  Gracy Bruins, PharmD Clinical Pharmacist [facility]

## 2013-06-16 NOTE — Progress Notes (Signed)
Physical Therapy Session Note  Patient Details  Name: Sharon Daniels MRN: [id number] Date of Birth: 1950-12-24  Today's Date: 06/16/2013 Time: 1301-1350 Time Calculation (min): 49 min  Short Term Goals: Week 1:  PT Short Term Goal 1 (Week 1): = LTGs  Skilled Therapeutic Interventions/Progress Updates:   Self Care:  Family education of husband on supine<>sit, sit<>stand, sit<>supine transfers; w/c management with elevated leg rests; car transfer for SUV sized vehicle for both front and back seat; stairs x2 with door frame for support enter/exit using forward stepping (enter) and retro stepping (exit), HEP and AAROM for patient. Pt. Demonstrated with husband positioned for additional support/safety. Pt. Propelled w/c with elevated leg rests for >150' and ambulated >100' on controled surface (tile flooring).   Husband states no questions at this point and advised that OT Mardene Celeste) will be providing pt. And he additional information and techniques for safety and rehabilitation at home.  Pt. And husband given phone number for CIR and therapist office for any additional questions or assistance with HEP understanding.  During session Dr. Wynelle Link consulted with patient with additional instructions on follow-up visit with his office one week from today.  Therapy Documentation Precautions:  Precautions Precautions: Fall Precaution Comments: WBAT bilaterally Required Braces or Orthoses:  (d/c KI's as tolerated per PT) Knee Immobilizer - Right: Discontinue once straight leg raise with < 10 degree lag Knee Immobilizer - Left: Discontinue once straight leg raise with < 10 degree lag Restrictions Weight Bearing Restrictions: No RLE Weight Bearing: Weight bearing as tolerated LLE Weight Bearing: Weight bearing as tolerated  Vital Signs: Therapy Vitals Pulse Rate: 97 SpO2: 93 % O2 Device: None (Room air)  Pain:  Pt. States pain "the same" with R>L. Pain increases with activity and  diminishes with rest. Pt. Denies wanting medication or additional intervention; "just rest." Therapist aided in postioning pt in recliner and providing additional needs for comfort.  Mobility: Transfers Transfers: Yes Stand Pivot Transfers: 6: Modified independent (Device/Increase time)  Locomotion : Ambulation Ambulation/Gait Assistance: 6: Modified independent (Device/Increase time) Ambulation Distance (Feet): 100 Feet Assistive device: Rolling walker Gait Gait Pattern: Step-through pattern;Decreased step length - right;Decreased step length - left;Decreased hip/knee flexion - right;Decreased hip/knee flexion - left;Decreased stride length Stairs / Additional Locomotion Stairs Assistance: 6: Modified independent (Device/Increase time) Wheelchair Mobility Wheelchair Mobility: Yes   See FIM for current functional status  Therapy/Group: Individual Therapy  Juluis Mire 06/16/2013, 3:55 PM

## 2013-06-16 NOTE — Progress Notes (Addendum)
[facility] PHYSICAL MEDICINE & REHABILITATION     PROGRESS NOTE    Subjective/Complaints: Had a good day with therapy. Did stairs yesterday. Concerned that she hasn't moved bowels since sunday A 12 point review of systems has been performed and if not noted above is otherwise negative.   Objective: Vital Signs: Blood pressure 115/72, pulse 97, temperature 98.5 F (36.9 C), temperature source Oral, resp. rate 20, height 5\' 8"  (1.727 m), weight 99.6 kg (219 lb 9.3 oz), SpO2 91.00%. No results found.  Recent Labs  06/16/13 0540  WBC 8.4  HGB 8.3*  HCT 25.9*  PLT 476*    Recent Labs  06/15/13 0345  NA 139  K 4.3  CL 102  GLUCOSE 107*  BUN 8  CREATININE 0.61  CALCIUM 8.8   CBG (last 3)  No results found for this basename: GLUCAP,  in the last 72 hours  Wt Readings from Last 3 Encounters:  06/10/13 99.6 kg (219 lb 9.3 oz)  06/05/13 95.255 kg (210 lb)  06/05/13 95.255 kg (210 lb)    Physical Exam:  Constitutional: She appears well-developed.  HENT:  Head: Normocephalic and atraumatic.  Eyes: Conjunctivae are normal. Pupils are equal, round, and reactive to light.  Neck: No JVD present. No tracheal deviation present. No thyromegaly present.  Cardiovascular: Normal rate. No murmur  Respiratory: No respiratory distress.  GI: She exhibits less distension. Bowel sounds improved Musculoskeletal:  1+ edema around either knee (no changes).  Skin: wounds intact. No drainage or erythema Neurological: alert and oriented x 3. Cognitively appropriate UE's 5/5. KE and HF limited due to knee pain/surgery--grossly 1+ to 2 respectively. Normal ADF/APF bilaterally. No sensory changes. Dtr;s 1+ Psychiatric: She has a normal mood and affect. Her behavior is normal. Thought content normal   Assessment/Plan: 1. 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 Physiatrist is providing close team supervision and 24 hour management of active medical problems listed below. Physiatrist and rehab team continue to assess barriers to discharge/monitor patient progress toward functional and medical goals. FIM: FIM - Bathing Bathing Steps Patient Completed: Chest;Right Arm;Left Arm;Abdomen;Front perineal area;Buttocks;Right upper leg;Left upper leg;Right lower leg (including foot);Left lower leg (including foot) Bathing: 5: Set-up assist to: Adjust water temp  FIM - Upper Body Dressing/Undressing Upper body dressing/undressing steps patient completed: Thread/unthread right sleeve of pullover shirt/dresss;Thread/unthread left sleeve of pullover shirt/dress;Put head through opening of pull over shirt/dress;Pull shirt over trunk Upper body dressing/undressing: 7: Complete Independence: No helper FIM - Lower Body Dressing/Undressing Lower body dressing/undressing steps patient completed: Pull pants up/down;Thread/unthread left pants leg;Thread/unthread right pants leg;Don/Doff left sock;Don/Doff right sock Lower body dressing/undressing: 4: Min-Patient completed 75 plus % of tasks  FIM - Toileting Toileting steps completed by patient: Performs perineal hygiene;Adjust clothing after toileting;Adjust clothing prior to toileting Toileting Assistive Devices: Grab bar or rail for support Toileting: 6: Assistive device: No helper  FIM - Radio producer Devices: Walker;Grab bars;Bedside commode Toilet Transfers: 6-Assistive device: No helper  FIM - Bed/Chair Transfer Bed/Chair Transfer Assistive Devices: Copy: 6: Supine > Sit: No assist;6: Chair or W/C > Bed: No assist;6: Bed > Chair or W/C: No assist;6: Sit > Supine: No assist  FIM - Locomotion: Wheelchair Locomotion: Wheelchair: 5: Travels 150 ft or more: maneuvers on rugs and over door sills with supervision, cueing or coaxing FIM - Locomotion:  Ambulation Locomotion: Ambulation  Assistive Devices: Other (comment) (rolator) Ambulation/Gait Assistance: 5: Supervision Locomotion: Ambulation: 2: Travels 50 - 149 ft with supervision/safety issues  Comprehension Comprehension Mode: Auditory Comprehension: 7-Follows complex conversation/direction: With no assist  Expression Expression Mode: Verbal Expression: 7-Expresses complex ideas: With no assist  Social Interaction Social Interaction: 6-Interacts appropriately with others with medication or extra time (anti-anxiety, antidepressant).  Problem Solving Problem Solving: 6-Solves complex problems: With extra time  Memory Memory: 7-Complete Independence: No helper Medical Problem List and Plan:  1. 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 DVT/PE: coumadin therapeutic yesterday, CTA+ for right sided PE's  -improved symptomatically  -O2 via [place] to keep sats greater than 90% 3. Pain Management: added OxyContin CR 10 mg bid for more consistent pain relief   - Will continue prn oxycodone and advised patient to use ice past therapy sessions.  4. Mood: Team to provide ego support to help with anxiety issues. LCSW to follow for evaluation and support.  5. Neuropsych: This patient is capable of making decisions on her own behalf.  6. ABLA:continue iron supplement. Recheck hgb 8.3---generally stable 7. HTN: Monitor BP every 8 hours. Blood pressures on low side therefore will set parameters on Avapro.  8. H/o Seasonal allergies: continue Singulair and Claritin. Encourage IS every hour to help with pulmonary toilet. Wean oxygen as able  9. GERD: Stable on protonix.  10. Constipation:  -no bm since on solids.   -sorbitol, SSE if needed today  -discussed maintenance of bowels after dc including diet, exercise, meds    LOS (Days) 7 A FACE TO FACE EVALUATION WAS PERFORMED  Sharon Daniels 06/16/2013 7:46 AM

## 2013-06-17 ENCOUNTER — Other Ambulatory Visit: Payer: Self-pay | Admitting: Physical Medicine and Rehabilitation

## 2013-06-17 DIAGNOSIS — K9189 Other postprocedural complications and disorders of digestive system: Secondary | ICD-10-CM

## 2013-06-17 DIAGNOSIS — D62 Acute posthemorrhagic anemia: Secondary | ICD-10-CM

## 2013-06-17 DIAGNOSIS — Z96659 Presence of unspecified artificial knee joint: Secondary | ICD-10-CM

## 2013-06-17 DIAGNOSIS — IMO0002 Reserved for concepts with insufficient information to code with codable children: Secondary | ICD-10-CM

## 2013-06-17 DIAGNOSIS — E876 Hypokalemia: Secondary | ICD-10-CM | POA: Diagnosis present

## 2013-06-17 DIAGNOSIS — M1710 Unilateral primary osteoarthritis, unspecified knee: Secondary | ICD-10-CM

## 2013-06-17 DIAGNOSIS — I824Z9 Acute embolism and thrombosis of unspecified deep veins of unspecified distal lower extremity: Secondary | ICD-10-CM | POA: Diagnosis present

## 2013-06-17 DIAGNOSIS — K5900 Constipation, unspecified: Secondary | ICD-10-CM | POA: Diagnosis present

## 2013-06-17 DIAGNOSIS — K567 Ileus, unspecified: Secondary | ICD-10-CM | POA: Diagnosis present

## 2013-06-17 LAB — PROTIME-INR
INR: 2.11 — AB (ref 0.00–1.49)
PROTHROMBIN TIME: 23 s — AB (ref 11.6–15.2)

## 2013-06-17 MED ORDER — METOCLOPRAMIDE HCL 5 MG PO TABS
5.0000 mg | ORAL_TABLET | Freq: Three times a day (TID) | ORAL | Status: DC
  Filled 2013-06-17 (×3): qty 1

## 2013-06-17 MED ORDER — POLYSACCHARIDE IRON COMPLEX 150 MG PO CAPS: 150.0000 mg | ORAL_CAPSULE | Freq: Two times a day (BID) | ORAL | Status: DC

## 2013-06-17 MED ORDER — WARFARIN SODIUM 5 MG PO TABS: ORAL_TABLET | ORAL | Status: DC

## 2013-06-17 MED ORDER — METOCLOPRAMIDE HCL 5 MG PO TABS: ORAL_TABLET | ORAL | Status: DC

## 2013-06-17 MED ORDER — POLYETHYLENE GLYCOL 3350 17 G PO PACK: 17.0000 g | PACK | Freq: Two times a day (BID) | ORAL | Status: DC

## 2013-06-17 MED ORDER — NYSTATIN 100000 UNIT/GM EX POWD: 1.0000 g | Freq: Two times a day (BID) | CUTANEOUS | Status: DC

## 2013-06-17 MED ORDER — OXYCODONE HCL 5 MG PO TABS: 5.0000 mg | ORAL_TABLET | Freq: Four times a day (QID) | ORAL | Status: DC | PRN

## 2013-06-17 MED ORDER — WARFARIN SODIUM 5 MG PO TABS: 5.0000 mg | ORAL_TABLET | Freq: Every day | ORAL | Status: DC

## 2013-06-17 MED ORDER — OXYCODONE HCL ER 10 MG PO T12A: EXTENDED_RELEASE_TABLET | ORAL | Status: DC

## 2013-06-17 NOTE — Progress Notes (Signed)
[facility] PHYSICAL MEDICINE & REHABILITATION     PROGRESS NOTE    Subjective/Complaints: Moved bowels yesterday. No new issues A 12 point review of systems has been performed and if not noted above is otherwise negative.   Objective: Vital Signs: Blood pressure 111/69, pulse 90, temperature 98.4 F (36.9 C), temperature source Oral, resp. rate 18, height 5\' 8"  (1.727 m), weight 95.4 kg (210 lb 5.1 oz), SpO2 95.00%. No results found.  Recent Labs  06/16/13 0540  WBC 8.4  HGB 8.3*  HCT 25.9*  PLT 476*    Recent Labs  06/15/13 0345  NA 139  K 4.3  CL 102  GLUCOSE 107*  BUN 8  CREATININE 0.61  CALCIUM 8.8   CBG (last 3)  No results found for this basename: GLUCAP,  in the last 72 hours  Wt Readings from Last 3 Encounters:  06/17/13 95.4 kg (210 lb 5.1 oz)  06/05/13 95.255 kg (210 lb)  06/05/13 95.255 kg (210 lb)    Physical Exam:  Constitutional: She appears well-developed.  HENT:  Head: Normocephalic and atraumatic.  Eyes: Conjunctivae are normal. Pupils are equal, round, and reactive to light.  Neck: No JVD present. No tracheal deviation present. No thyromegaly present.  Cardiovascular: Normal rate. No murmur  Respiratory: No respiratory distress.  GI: She exhibits less distension. Bowel sounds improved Musculoskeletal:  1+ edema around either knee (no changes).  Skin: wounds intact. No drainage or erythema Neurological: alert and oriented x 3. Cognitively appropriate UE's 5/5. KE and HF limited due to knee pain/surgery--grossly 1+ to 2 respectively. Normal ADF/APF bilaterally. No sensory changes. Dtr;s 1+ Psychiatric: She has a normal mood and affect. Her behavior is normal. Thought content normal   Assessment/Plan: 1. 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 Physiatrist is providing close team supervision and 24  hour management of active medical problems listed below. Physiatrist and rehab team continue to assess barriers to discharge/monitor patient progress toward functional and medical goals.  Dc to home. Home bpm. Ortho follow up/pcp follow up. hhrn FIM: FIM - Bathing Bathing Steps Patient Completed: Chest;Right Arm;Left Arm;Abdomen;Front perineal area;Buttocks;Right upper leg;Left upper leg;Right lower leg (including foot);Left lower leg (including foot) Bathing: 6: Assistive device (Comment)  FIM - Upper Body Dressing/Undressing Upper body dressing/undressing steps patient completed: Thread/unthread right sleeve of pullover shirt/dresss;Thread/unthread left sleeve of pullover shirt/dress;Put head through opening of pull over shirt/dress;Pull shirt over trunk Upper body dressing/undressing: 7: Complete Independence: No helper FIM - Lower Body Dressing/Undressing Lower body dressing/undressing steps patient completed: Pull pants up/down;Thread/unthread left pants leg;Thread/unthread right pants leg;Don/Doff left sock;Don/Doff right shoe;Don/Doff left shoe;Fasten/unfasten right shoe;Don/Doff right sock;Fasten/unfasten left shoe Lower body dressing/undressing: 6: Assistive device (Comment)  FIM - Toileting Toileting steps completed by patient: Performs perineal hygiene;Adjust clothing after toileting;Adjust clothing prior to toileting Toileting Assistive Devices: Grab bar or rail for support Toileting: 6: More than reasonable amount of time  FIM - Radio producer Devices: Walker;Grab bars;Bedside commode Toilet Transfers: 6-Assistive device: No helper  FIM - Bed/Chair Transfer Bed/Chair Transfer Assistive Devices: Copy: 6: Supine > Sit: No assist;6: Chair or W/C > Bed: No assist;6: Bed > Chair or W/C: No assist;6: Sit > Supine: No assist  FIM - Locomotion: Wheelchair Distance: 100 Locomotion: Wheelchair: 5: Travels 50 - 149 ft, turns around,  maneuvers to table, bed or toilet, negotiates 3% grade: modified  independent FIM - Locomotion: Ambulation Locomotion: Ambulation Assistive Devices: Administrator Ambulation/Gait Assistance: 6: Modified independent (Device/Increase time) Locomotion: Ambulation: 5: Household Independent - travels 34 - 149 ft independent or modified independent  Comprehension Comprehension Mode: Auditory Comprehension: 7-Follows complex conversation/direction: With no assist  Expression Expression Mode: Verbal Expression: 7-Expresses complex ideas: With no assist  Social Interaction Social Interaction: 6-Interacts appropriately with others with medication or extra time (anti-anxiety, antidepressant).  Problem Solving Problem Solving: 6-Solves complex problems: With extra time  Memory Memory: 7-Complete Independence: No helper  Medical Problem List and Plan:  1. 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 DVT/PE: CTA+ for right sided PE's  -improved symptomatically  -O2 via [place] to keep sats greater than 90%  -HHRN to check INR tomorrow 3. Pain Management: added OxyContin CR 10 mg bid for more consistent pain relief   - Will continue prn oxycodone and advised patient to use ice past therapy sessions.  4. Mood: Team to provide ego support to help with anxiety issues. LCSW to follow for evaluation and support.  5. Neuropsych: This patient is capable of making decisions on her own behalf.  6. ABLA:continue iron supplement. Recheck hgb 8.3---follow up as outpt 7. HTN: Monitor BP every 8 hours. Blood pressures on low side therefore will set parameters on Avapro.  8. H/o Seasonal allergies: continue Singulair and Claritin. Encourage IS every hour to help with pulmonary toilet. Wean oxygen as able  9. GERD: Stable on protonix.  10. Constipation:  -bm yesterday  -will need maintenance program at home    LOS (Days) 8 A FACE TO FACE EVALUATION WAS  PERFORMED  Sharon Daniels T 06/17/2013 7:52 AM

## 2013-06-17 NOTE — Discharge Summary (Signed)
Physician Discharge Summary  Patient ID: Sharon Daniels MRN: [id number] DOB/AGE: August 10, 1950 63 y.o.  Admit date: 06/09/2013 Discharge date: 06/17/2013  Discharge Diagnoses:  Principal Problem:   OA (osteoarthritis) of knee Active Problems:   Postoperative anemia due to acute blood loss   Acute pulmonary embolism   DVT, lower extremity, distal, acute   Ileus, postoperative   Narcotic induced constipaiton   Hypokalemia   Discharged Condition: Stable.   Significant Diagnostic Studies: Dg Chest 2 View  06/12/2013   CLINICAL DATA:  Evaluate for pulmonary embolism  EXAM: CHEST  2 VIEW  COMPARISON:  05/27/2013; 08/19/2003  FINDINGS: Grossly unchanged cardiac silhouette and mediastinal contours. Labs are slightly reduced with mild elevation of the right hemidiaphragm and slight worsening of bibasilar heterogeneous opacities, left greater than right. Mild cephalization of flow without frank evidence of edema. No definite pleural effusion. No pneumothorax. Unchanged bones including mild scoliotic curvature of the thoracolumbar spine.  IMPRESSION: Bibasilar atelectasis without acute cardiopulmonary disease on this hypoventilated examination.   Electronically Signed   By: Sandi Mariscal M.D.   On: 06/12/2013 07:43    Dg Abd 1 View  06/12/2013   CLINICAL DATA:  Constipation.  EXAM: ABDOMEN - 1 VIEW  COMPARISON:  Abdominal radiograph 06/11/2013.  FINDINGS: Stool is demonstrated within the cecum and ascending colon. Gas is demonstrated throughout the colon. No dilated loops of small bowel are identified. The upper abdomen and liver excluded from view, limiting evaluation for free intraperitoneal air, lung base pathology and portal venous gas. Lower lumbar spine degenerative change.  IMPRESSION: Nonobstructed bowel gas pattern. Mild amount of stool and gas within the colon.   Electronically Signed   By: Lovey Newcomer M.D.   On: 06/12/2013 08:14   Dg Abd 1 View  06/11/2013   CLINICAL DATA:  Abdominal  distension and obstipation  EXAM: ABDOMEN - 1 VIEW  COMPARISON:  CT abdomen and pelvis February 06, 2008  FINDINGS: There is extensive stool throughout the ascending colon and proximal transverse colon. Stool is moderate elsewhere. There is mild generalized colonic dilatation. There is no appreciable small bowel obstruction. No free air or portal venous air seen on this supine examination. No abnormal calcifications.  IMPRESSION: Mild generalized colonic dilatation. There is copious stool throughout the ascending and proximal transverse colon with moderate stool elsewhere. Question a degree of colonic ileus. Frank bowel obstruction is not seen. No free air.   Electronically Signed   By: Lowella Grip M.D.   On: 06/11/2013 10:03   Ct Angio Chest Pe W/cm &/or Wo Cm  06/12/2013   CLINICAL DATA:  Worsening hypoxia and increased A-a gradient. Clinical suspicion for pulmonary embolism.  EXAM: CT ANGIOGRAPHY CHEST WITH CONTRAST  TECHNIQUE: Multidetector CT imaging of the chest was performed using the standard protocol during bolus administration of intravenous contrast. Multiplanar CT image reconstructions and MIPs were obtained to evaluate the vascular anatomy.  CONTRAST:  181mL OMNIPAQUE IOHEXOL 350 MG/ML SOLN  COMPARISON:  None.  FINDINGS: Pulmonary embolism is seen within the right upper, middle, and lower lobar pulmonary arteries. No pulmonary emboli seen in the left pulmonary arteries.  No evidence of thoracic aortic aneurysm or dissection. No evidence of mediastinal mass or hematoma. No lymphadenopathy identified within the thorax.  Dependent atelectasis is noted bilaterally however there is no evidence of pulmonary consolidation or mass. Central tracheobronchial airways are patent.  Review of the MIP images confirms the above findings.  IMPRESSION: Positive for pulmonary embolism in right upper, middle, and  lower lobar pulmonary arteries.  Mild dependent atelectasis in both lungs.  Critical Value/emergent  results were called by telephone at the time of interpretation on 06/12/2013 at 11:28 AM to Dr. Alger Simons , who verbally acknowledged these results.   Electronically Signed   By: Earle Gell M.D.   On: 06/12/2013 11:28    Labs:  Basic Metabolic Panel:  Recent Labs Lab 06/12/13 0825 06/15/13 0345  NA 135* 139  K 3.5* 4.3  CL 97 102  CO2 26 27  GLUCOSE 154* 107*  BUN 9 8  CREATININE 0.55 0.61  CALCIUM 8.3* 8.8    CBC:  Recent Labs Lab 06/16/13 0540  WBC 8.4  HGB 8.3*  HCT 25.9*  MCV 92.2  PLT 476*      Brief HPI:   Sharon Daniels is a 63 y.o. 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 Patient elected to undergo B-TKR on 06/05/13 By Dr. Wynelle Link. Post op WBAT and on lovenox/coumadin bridge for DVT prophylaxis. Is WBAT with CPM to assist with ROM. ABLA with hgb 7.1 treated with 2 units PRBC. Therapies initiated and patient limited by fatigue, anxiety as well as dizziness question orthostasis. Therapies initiated and CIR recommended by Rehab team and MD therefore patient admitted today.     Hospital Course: Sharon Daniels was admitted to rehab 06/09/2013 for inpatient therapies to consist of PT, ST and OT at least three hours five days a week. Past admission physiatrist, therapy team and rehab RN have worked together to provide customized collaborative inpatient rehab. Blood pressures have been well controlled and bilateral incisions have been healing well without signs or symptoms of infection.  She was started on OxyContin for more consistent pain coverage. She has required aggressive bowel program for severe constipation with multiple SSE as well as multiple oral medications to help with her symptoms. She was noted to be hypoxic with activity as well as at nights and ABG done showing increased A-a gradient.  CTA chest was positive for PE and BLE dopplers revealed acute thrombus in right gastrocnemius vein.  She has been cross covered by Lovenox as well as coumadin for at least five days and was placed on bed rest for 24 hours. ABLA has been stable and she is to continue on iron supplements for next couple of months.  Her mobility has gradually improved and she has been weaned off oxygen.  She has made good progress and is independent at discharge. Dr. Ernie Hew to manage coumadin with close follow up of protimes next two days due to downward trend in INR.  [facility] to provide  HHPT and [facility] for blood draws past discharge.    Rehab course: During patient's stay in rehab weekly team conferences were held to monitor patient's progress, set goals and discuss barriers to discharge. Patient has had improvement in activity tolerance, balance, postural control, as well as ability to compensate for deficits. She is able to complete ADL tasks with AE and increased time. She is independent for transfers and is able to ambulate > 150' with RW.  RLE AAROM knee flexion is 78 degrees and LLE AAROM  Knee flexion is 87 degrees. She is to continue to use home CPM.   Disposition: Home  Diet: Regular  Special Instructions: 1. Wear support stocking for at least a month.  2. HHRN to draw protime 06/18/13 with call and fax of results to Dr. Ernie Hew  Medication List    STOP taking these medications       diphenhydrAMINE 12.5 MG/5ML elixir  Commonly known as:  BENADRYL     DSS 100 MG Caps     enoxaparin 30 MG/0.3ML injection  Commonly known as:  LOVENOX     methocarbamol 500 MG tablet  Commonly known as:  ROBAXIN     nystatin 100000 UNIT/ML suspension  Commonly known as:  MYCOSTATIN     psyllium 95 % Pack  Commonly known as:  HYDROCIL/METAMUCIL      TAKE these medications       acetaminophen 325 MG tablet  Commonly known as:  TYLENOL  Take 2 tablets (650 mg total) by mouth every 6 (six) hours as needed for mild pain (or Fever >/= 101).     alum & mag hydroxide-simeth 200-200-20 MG/5ML suspension   Commonly known as:  MAALOX/MYLANTA  Take 30 mLs by mouth every 8 (eight) hours as needed for indigestion or heartburn.     alum hydroxide-mag trisilicate 72-53 MG Chew chewable tablet  Commonly known as:  GAVISCON  Chew 1-2 tablets by mouth 4 (four) times daily as needed for indigestion or heartburn.     bisacodyl 10 MG suppository  Commonly known as:  DULCOLAX  Place 1 suppository (10 mg total) rectally daily as needed for moderate constipation.     COMBIVENT RESPIMAT 20-100 MCG/ACT Aers respimat  Generic drug:  Ipratropium-Albuterol  Inhale 1 puff into the lungs daily.     fluticasone 50 MCG/ACT nasal spray  Commonly known as:  FLONASE  Place 1 spray into both nostrils daily as needed for allergies or rhinitis.     iron polysaccharides 150 MG capsule  Commonly known as:  NIFEREX  Take 1 capsule (150 mg total) by mouth 2 times daily at 12 noon and 4 pm. For anemia     loratadine 10 MG tablet  Commonly known as:  CLARITIN  Take 10 mg by mouth daily.     MAGNESIUM PO  Take 1 tablet by mouth daily.     metoCLOPramide 5 MG tablet  Commonly known as:  REGLAN  Take one pill before breakfast and supper for 3 days then decrease to one at breakfast till gone.     montelukast 10 MG tablet  Commonly known as:  SINGULAIR  Take 10 mg by mouth daily.     nystatin 100000 UNIT/GM Powd  Apply topically 2 (two) times daily. Under breasts     omeprazole 40 MG capsule  Commonly known as:  PRILOSEC  Take 40 mg by mouth daily.     ondansetron 4 MG tablet  Commonly known as:  ZOFRAN  Take 1 tablet (4 mg total) by mouth every 6 (six) hours as needed for nausea.     oxyCODONE 5 MG immediate release tablet--Rx # 60 pills   Commonly known as:  Oxy IR/ROXICODONE  Take 1-2 tablets (5-10 mg total) by mouth every 6 (six) hours as needed for moderate pain, severe pain or breakthrough pain.     OxyCODONE 10 mg T12a 12 hr tablet--Rx # 21 pills  Commonly known as:  OXYCONTIN  Use one pill at  8am and 8 pm for a week. Then decrease to one pill a day till gone.     polyethylene glycol packet  Commonly known as:  MIRALAX / GLYCOLAX  Take 17 g by mouth 2 (two) times daily.     POTASSIUM PO  Take 1 tablet by mouth daily.  SYSTANE BALANCE 0.6 % Soln  Generic drug:  Propylene Glycol  Apply 1 drop to eye as needed (Dry eyes).     traMADol 50 MG tablet  Commonly known as:  ULTRAM  Take 1-2 tablets (50-100 mg total) by mouth every 6 (six) hours as needed (mild to moderate pain).     valsartan 80 MG tablet  Commonly known as:  DIOVAN  Take 80 mg by mouth every morning.     warfarin 5 MG tablet  Commonly known as:  COUMADIN  Take daily with supper       Follow-up Information   Call Meredith Staggers, MD. (As needed)    Specialty:  Physical Medicine and Rehabilitation   Contact information:   [address], [place] [address] [phone]       Follow up with Gearlean Alf, MD. Call today. (For wound re-check)    Specialty:  Orthopedic Surgery   Contact information:   [address] [place] [address] [phone]       Follow up with [facility], MD On 06/17/2013. (office to call about appointment for follow up and management of coumadin)    Specialty:  Family Medicine   Contact information:   [address] [place] [address] [phone]       Signed: Bary Leriche 06/17/2013, 10:00 AM

## 2013-06-17 NOTE — Progress Notes (Signed)
Patient and family received discharge instructions from Pam Love, PA-C with verbal understanding. Patient discharged to home with family and belongings. 

## 2013-06-17 NOTE — Progress Notes (Signed)
Social Work Patient ID: Sharon Daniels, female   DOB: Jun 12, 1950, 63 y.o.   MRN: [id number]  CSW spoke with pt via telephone to update her on team conference discussion.  Pt was already aware of d/c scheduled for 06-17-13 and she is pleased to be going home and feels comfortable with it.  [facility] is following for home care (RN and PT) and to arrange CPM through [employer].  [facility] to provide tub seat, rolling walker, and drop arm commode.  She will have assistance at home initially from her husband and dtr.  Pt has no other concerns/needs/questions for CSW.

## 2013-06-17 NOTE — Patient Care Conference (Signed)
Inpatient RehabilitationTeam Conference and Plan of Care Update Date: 06/16/2013   Time: 2:00 PM    Patient Name: Sharon Daniels      Medical Record Number: [id number]  Date of Birth: 1950-04-08 Sex: Female         Room/Bed: 4M10C/4M10C-01 Payor Info: Payor: [employer] / Plan: [employer] / Product Type: *No Product type* /    Admitting Diagnosis: B TKAS  B CPMS  Admit Date/Time:  06/09/2013  4:27 PM Admission Comments: No comment available   Primary Diagnosis:  <principal problem not specified> Principal Problem: <principal problem not specified>  Patient Active Problem List   Diagnosis Date Noted  . Acute pulmonary embolism 06/13/2013  . Postoperative anemia due to acute blood loss 06/09/2013  . Transfusion history 06/09/2013  . OA (osteoarthritis) of knee 06/05/2013  . HIATAL HERNIA 06/18/2007  . FATTY LIVER DISEASE 06/18/2007    Expected Discharge Date: Expected Discharge Date: 06/17/13  Team Members Present: Physician leading conference: Dr. Alger Simons Social Worker Present: Alfonse Alpers, LCSW Nurse Present: Elliot Cousin, RN PT Present: Raylene Everts, PT OT Present: Chrys Racer, OT PPS Coordinator present : Daiva Nakayama, RN, CRRN;Becky Alwyn Ren, PT     Current Status/Progress Goal Weekly Team Focus  Medical   PE rx'ed. on coumadin. ileus resolved. moving bowels  stabilize medically so that she can go home  rx medical issues to allow dc   Bowel/Bladder   Continent of bowel and bladder. LBM 06/14/13  Pt to remain continent of bowel and bladder  Monitor   Swallow/Nutrition/ Hydration             ADL's   overall mod I  overall mod I  d/c planning   Mobility   S to mod I for RW/rolator traveling >50' & transfers, frequent rest breaks  Mod I overall wtih min A for car and stairs.  ambulation with RW/Rolator, stairs, strengthen/endurance LE   Communication             Safety/Cognition/ Behavioral Observations            Pain   Pain managed with  scheduled Oxycondone 10mg  q 12 hrs, Oxy IR 5-10mg  q 4hrs prn  <5  Offer pain medication 1hour prior to initial therapy session   Skin   Steristrips to bilateral knee repair  No additional skin breakdown  Assess for appropriate healing q shift. Routine turn q 2hrs     Rehab Goals Patient on target to meet rehab goals: Yes Rehab Goals Revised: None *See Care Plan and progress notes for long and short-term goals.  Barriers to Discharge: none    Possible Resolutions to Barriers:  none    Discharge Planning/Teaching Needs:  Pt to return to her home with the support of her husband and dtr (for one week).  Pt's husband received family education the day of conference.   Team Discussion:  Pt had a small PE and is receiving treatment and monitoring.  Her bowels are better, but pt has chronic constipation, so she will have to adhere to bowel plan at home for best outcome.  Pt is using CPM and is at 80 degrees currently.  She will need it for home use.  Revisions to Treatment Plan:  None   Continued Need for Acute Rehabilitation Level of Care: The patient requires daily medical management by a physician with specialized training in physical medicine and rehabilitation for the following conditions: Daily direction of a multidisciplinary physical rehabilitation program to  ensure safe treatment while eliciting the highest outcome that is of practical value to the patient.: Yes Daily medical management of patient stability for increased activity during participation in an intensive rehabilitation regime.: Yes Daily analysis of laboratory values and/or radiology reports with any subsequent need for medication adjustment of medical intervention for : Neurological problems;Other  Meshia Rau, Silvestre Mesi 06/17/2013, 9:40 AM

## 2013-06-17 NOTE — Discharge Instructions (Signed)
Inpatient Rehab Discharge Instructions  Sharon Daniels Discharge date and time:  06/17/13  Activities/Precautions/ Functional Status: Activity: activity as tolerated. No Driving Diet: regular diet Wound Care: keep wound clean and dry. Wash with soap and water. Pat dry. Call MD if you develop redness, drainage, fever, chills or if wound starts breaking down.    Functional status:  ___ No restrictions     ___ Walk up steps independently ___ 24/7 supervision/assistance   ___ Walk up steps with assistance _X__ Intermittent supervision/assistance  _X__ Bathe/dress independently _X__ Walk with walker     ___ Bathe/dress with assistance ___ Walk Independently    ___ Shower independently ___ Walk with assistance    ___ Shower with assistance _X__ No alcohol     ___ Return to work/school ________  COMMUNITY REFERRALS UPON DISCHARGE:   Home Health:   PT     RN     Agency: [facility] Phone:  [phone] Medical Equipment/Items Ordered:  Rolling walker, drop arm commode, tub seat  Agency/Supplier:  [facility]     Phone:  [phone] Other:  CPM arranged by [facility] through [employer]  GENERAL COMMUNITY RESOURCES FOR PATIENT/FAMILY: Support Groups:  Continue meetings as you feel necessary for support  Special Instructions: 1. Note: as long as you are taking narcotics you will have severe constipation problems. Continue miralax twice a day. Use fleets enema if no results in 48 hours.  2. Contact Dr. Wynelle Link if you notice any redness, swelling, purulent drainage from wound or fevers or chills . 3. Wear support stocking daily--don in am and doff in evenings.  My questions have been answered and I understand these instructions. I will adhere to these goals and the provided educational materials after my discharge from the hospital.  Patient/Caregiver Signature _______________________________ Date __________  Clinician Signature _______________________________________ Date  __________  Please bring this form and your medication list with you to all your follow-up doctor's appointments.

## 2013-07-23 ENCOUNTER — Encounter: Payer: Self-pay | Admitting: Internal Medicine

## 2014-02-19 ENCOUNTER — Encounter: Payer: Self-pay | Admitting: Internal Medicine

## 2014-03-18 ENCOUNTER — Telehealth: Payer: Self-pay | Admitting: *Deleted

## 2014-03-18 NOTE — Telephone Encounter (Signed)
  Spoke to pt and she states she is no longer on Coumadin

## 2014-03-22 ENCOUNTER — Ambulatory Visit (AMBULATORY_SURGERY_CENTER): Payer: Self-pay

## 2014-03-22 VITALS — Ht 68.0 in | Wt 199.4 lb

## 2014-03-22 DIAGNOSIS — Z1211 Encounter for screening for malignant neoplasm of colon: Secondary | ICD-10-CM

## 2014-03-22 MED ORDER — MOVIPREP 100 G PO SOLR
1.0000 | Freq: Once | ORAL | Status: DC
Start: 2014-03-22 — End: 2014-04-01

## 2014-03-22 NOTE — Progress Notes (Signed)
Discuss complication of anesthesia with pt (did fine with knee surgery; could hear talking with breast surgery) No allergies to eggs or soy No diet/weight loss meds No home oxygen  Has email  Emmi instructions given for colonoscopy

## 2014-04-01 ENCOUNTER — Ambulatory Visit (AMBULATORY_SURGERY_CENTER): Payer: 59 | Admitting: Internal Medicine

## 2014-04-01 ENCOUNTER — Encounter: Payer: Self-pay | Admitting: Internal Medicine

## 2014-04-01 VITALS — BP 129/67 | HR 64 | Temp 97.7°F | Resp 40 | Ht 66.0 in | Wt 222.0 lb

## 2014-04-01 DIAGNOSIS — D124 Benign neoplasm of descending colon: Secondary | ICD-10-CM

## 2014-04-01 DIAGNOSIS — Z1211 Encounter for screening for malignant neoplasm of colon: Secondary | ICD-10-CM | POA: Diagnosis not present

## 2014-04-01 DIAGNOSIS — D125 Benign neoplasm of sigmoid colon: Secondary | ICD-10-CM

## 2014-04-01 MED ORDER — DISPOSABLE ENEMA 19-7 GM/118ML RE ENEM
1.0000 | ENEMA | Freq: Once | RECTAL | Status: AC
  Administered 2014-04-01: 1 via RECTAL

## 2014-04-01 MED ORDER — SODIUM CHLORIDE 0.9 % IV SOLN: 500.0000 mL | INTRAVENOUS | Status: DC

## 2014-04-01 NOTE — Op Note (Signed)
[facility]  [employer]. [place], [id number]   COLONOSCOPY PROCEDURE REPORT  PATIENT: Takita, Riecke  MR#: [id number] BIRTHDATE: 1950/08/27 , 63  yrs. old GENDER: female ENDOSCOPIST: Lafayette Dragon, MD REFERRED TL:[id number] Ernie Hew, M.D. PROCEDURE DATE:  04/01/2014 PROCEDURE:   Colonoscopy, screening and Colonoscopy with snare polypectomy First Screening Colonoscopy - Avg.  risk and is 50 yrs.  old or older - No.  Prior Negative Screening - Now for repeat screening. 10 or more years since last screening  History of Adenoma - Now for follow-up colonoscopy & has been > or = to 3 yrs.  N/A ASA CLASS:   Class II INDICATIONS:Screening for colonic neoplasia, Colorectal Neoplasm Risk Assessment for this procedure is average risk, and prior colonoscopy in February 2005 showed normal although redundant colon. MEDICATIONS: Monitored anesthesia care and Propofol 600 mg IV  DESCRIPTION OF PROCEDURE:   After the risks benefits and alternatives of the procedure were thoroughly explained, informed consent was obtained.  The digital rectal exam revealed no abnormalities of the rectum.   The LB [id number]  endoscope was introduced through the anus and advanced to the cecum, which was identified by both the appendix and ileocecal valve. No adverse events experienced.   The quality of the prep was good.  (MoviPrep was used)  The instrument was then slowly withdrawn as the colon was fully examined.      COLON FINDINGS: Three sessile polyps measuring 13 mm in size were found in the sigmoid colon and descending colon.  A polypectomy was performed using snare cautery.  The resection was complete, the polyp tissue was completely retrieved and sent to histology.  A polypectomy was performed with a cold snare.  The resection was complete, the polyp tissue was completely retrieved and sent to histology.  Retroflexed views revealed no abnormalities. The time to cecum =  17.39 Withdrawal time = 19.53   The scope was withdrawn and the procedure completed. COMPLICATIONS: There were no immediate complications.  ENDOSCOPIC IMPRESSION: 1.Three sessile polyps were found in the sigmoid colonx2 ( weach 3-4 mm) and descending colon x1 ( 14 mm, soft,flat ) polypectomy was performed using snare cautery; polypectomy was performed with a cold snare ( sigmoid polyps) 2.redundant colon  RECOMMENDATIONS: 1.  Await pathology results 2.  High fiber diet Recall colonoscopy pending path report  eSigned:  Lafayette Dragon, MD 04/01/2014 12:41 PM   cc:   PATIENT NAME:  Keierra, Nudo MR#: [id number]

## 2014-04-01 NOTE — Progress Notes (Signed)
Called to room to assist during endoscopic procedure.  Patient ID and intended procedure confirmed with present staff. Received instructions for my participation in the procedure from the performing physician.  

## 2014-04-01 NOTE — Progress Notes (Signed)
No problems noted in the recovery room. maw 

## 2014-04-01 NOTE — Progress Notes (Signed)
Report to PACU, RN, vss, BBS= Clear.  

## 2014-04-01 NOTE — Patient Instructions (Addendum)
YOU HAD AN ENDOSCOPIC PROCEDURE TODAY AT [facility]:   Refer to the procedure report that was given to you for any specific questions about what was found during the examination.  If the procedure report does not answer your questions, please call your gastroenterologist to clarify.  If you requested that your care partner not be given the details of your procedure findings, then the procedure report has been included in a sealed envelope for you to review at your convenience later.  YOU SHOULD EXPECT: Some feelings of bloating in the abdomen. Passage of more gas than usual.  Walking can help get rid of the air that was put into your GI tract during the procedure and reduce the bloating. If you had a lower endoscopy (such as a colonoscopy or flexible sigmoidoscopy) you may notice spotting of blood in your stool or on the toilet paper. If you underwent a bowel prep for your procedure, you may not have a normal bowel movement for a few days.  Please Note:  You might notice some irritation and congestion in your nose or some drainage.  This is from the oxygen used during your procedure.  There is no need for concern and it should clear up in a day or so.  SYMPTOMS TO REPORT IMMEDIATELY:   Following lower endoscopy (colonoscopy or flexible sigmoidoscopy):  Excessive amounts of blood in the stool  Significant tenderness or worsening of abdominal pains  Swelling of the abdomen that is new, acute  Fever of 100F or higher   For urgent or emergent issues, a gastroenterologist can be reached at any hour by calling [phone].   DIET: Your first meal following the procedure should be a small meal and then it is ok to progress to your normal diet. Heavy or fried foods are harder to digest and may make you feel nauseous or bloated.  Likewise, meals heavy in dairy and vegetables can increase bloating.  Drink plenty of fluids but you should avoid alcoholic beverages for 24  hours.  ACTIVITY:  You should plan to take it easy for the rest of today and you should NOT DRIVE or use heavy machinery until tomorrow (because of the sedation medicines used during the test).    FOLLOW UP: Our staff will call the number listed on your records the next business day following your procedure to check on you and address any questions or concerns that you may have regarding the information given to you following your procedure. If we do not reach you, we will leave a message.  However, if you are feeling well and you are not experiencing any problems, there is no need to return our call.  We will assume that you have returned to your regular daily activities without incident.  If any biopsies were taken you will be contacted by phone or by letter within the next 1-3 weeks.  Please call us at [phone] if you have not heard about the biopsies in 3 weeks.    SIGNATURES/CONFIDENTIALITY: You and/or your care partner have signed paperwork which will be entered into your electronic medical record.  These signatures attest to the fact that that the information above on your After Visit Summary has been reviewed and is understood.  Full responsibility of the confidentiality of this discharge information lies with you and/or your care-partner.    Handouts were given to your care partner on polyps and a high fiber diet with liberal fluid intake. You might notice some  irritation in your nose or drainage.  This may cause feelings of congestion.  This is from the oxygen, which can be drying.  This is no cause for concern; this should clear up in a few days.  You may resume your current medications today. Await biopsy results. Please call if any questions or concerns.

## 2014-04-01 NOTE — Progress Notes (Signed)
Patient stating last bowel movement was brown, not translucent. Informed Dr. Olevia Perches, enema ordered. Patient self administered one enema, results clear with few particulate.

## 2014-04-05 ENCOUNTER — Encounter: Payer: 59 | Admitting: Internal Medicine

## 2014-04-05 ENCOUNTER — Telehealth: Payer: Self-pay

## 2014-04-05 NOTE — Telephone Encounter (Signed)
Left message on answering machine. 

## 2014-04-08 ENCOUNTER — Encounter: Payer: Self-pay | Admitting: Internal Medicine

## 2014-06-24 ENCOUNTER — Ambulatory Visit (INDEPENDENT_AMBULATORY_CARE_PROVIDER_SITE_OTHER): Payer: 59 | Admitting: Family Medicine

## 2014-06-24 VITALS — BP 140/80 | HR 100 | Temp 98.7°F | Resp 18

## 2014-06-24 DIAGNOSIS — S81811A Laceration without foreign body, right lower leg, initial encounter: Secondary | ICD-10-CM | POA: Diagnosis not present

## 2014-06-24 NOTE — Progress Notes (Signed)
Procedure: Verbal consent obtained. Local anesthesia obtained with 3cc 2% Lido with epi Wound explored with pick-ups and cleaned with saline-soaked gauze. No injury to tendon and no foreign bodies visualized. 4 vertical mattress sutures placed to close wound on right anterior lower leg using 4-0 ethilon suture material. Pressure dressing applied to wound.  Seraphine Gudiel, PA-S [facility] 06/24/2014 7:17 PM

## 2014-06-24 NOTE — Patient Instructions (Signed)

## 2014-06-24 NOTE — Progress Notes (Signed)
I was directly involved in the patient's care and actively participated during the procedure.

## 2014-06-24 NOTE — Progress Notes (Signed)
Subjective:    Patient ID: ROSALINE EZEKIEL, female    DOB: [birth year], 64 y.o.   MRN: [id number]  06/24/2014  Laceration   HPI This 64 y.o. female presents for evaluation of laceration to R lower leg.  Was at home; bowl dropped at home and cut lower leg.  Excessive bleeding from laceration site.  Able to ambulate without difficulties. Last Tetanus unknown.  Denies n/t/w of RLE.  Had friend transport to office.  No anticoagulation at this time.   Review of Systems  Constitutional: Negative for fever, chills, diaphoresis and fatigue.  Musculoskeletal: Negative for myalgias.  Skin: Positive for wound.  Neurological: Negative for weakness and numbness.    Past Medical History  Diagnosis Date  . Allergy   . Arthritis   . Substance abuse   . Anemia   . Hypertension   . H/O bronchitis   . Pneumonia     hx of 10 years ago  . Depression     hx of  . History of kidney stones     x3  . Complication of anesthesia     "during breast surgery: could hear the surgery, wake up too soon, anxious  . Acute pulmonary embolism 06/13/2013    CT angio chest 06/12/13   Past Surgical History  Procedure Laterality Date  . Cosmetic surgery  2013    in the office  . Cesarean section  1989, 1992  . Breast surgery  1985    abscess  . Nasal sinus surgery  2000  . 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 Morphine Itching    REACTION: rash, IV   Current Outpatient Prescriptions  Medication Sig Dispense Refill  . Cholecalciferol (VITAMIN D3) 1000 UNITS CAPS Take by mouth.    . DULoxetine (CYMBALTA) 60 MG capsule Take 60 mg by mouth daily.    Marland Kitchen estradiol (ESTRACE) 0.1 MG/GM vaginal cream USE 1 GRAM VAGINALLY DAILY    . fluticasone (FLONASE) 50 MCG/ACT nasal spray     . loratadine (CLARITIN) 10 MG tablet Take 10  mg by mouth daily.    . montelukast (SINGULAIR) 10 MG tablet Take 10 mg by mouth daily.    . nicotine polacrilex (COMMIT) 2 MG lozenge 2 mg.    . nystatin (MYCOSTATIN/NYSTOP) 100000 UNIT/GM POWD Apply 1 g topically 2 (two) times daily. Under breasts 30 g 0  . omeprazole (PRILOSEC) 40 MG capsule Take 40 mg by mouth daily.    Marland Kitchen Propylene Glycol (SYSTANE BALANCE) 0.6 % SOLN Apply 1 drop to eye as needed (Dry eyes).    . valsartan (DIOVAN) 80 MG tablet Take 80 mg by mouth every morning.     . Vitamin E 100 UNITS TABS Take by mouth.    Marland Kitchen acetaminophen (TYLENOL) 325 MG tablet Take 2 tablets (650 mg total) by mouth every 6 (six) hours as needed for mild pain (or Fever >/= 101). (Patient not taking: Reported on 06/24/2014) 60 tablet 0   No current facility-administered medications for this visit.   History   Social History  . Marital Status: Married    Spouse Name: N/A  . Number of Children: N/A  . Years of Education: N/A   Occupational History  . Social Worker    Social History Main Topics  . Smoking status: Former Smoker -- 26  years    Types: Cigarettes    Quit date: 12/30/2011  . Smokeless tobacco: Never Used  . Alcohol Use: No     Comment: quit 18 years ago  . Drug Use: No     Comment: hx of cocaine, none 1982  . Sexual Activity: Not on file   Other Topics Concern  . Not on file   Social History Narrative        Objective:    BP 140/80 mmHg  Pulse 100  Temp(Src) 98.7 F (37.1 C) (Oral)  Resp 18  SpO2 97% Physical Exam  Constitutional: She is oriented to person, place, and time. She appears well-developed and well-nourished. No distress.  HENT:  Head: Normocephalic and atraumatic.  Eyes: Conjunctivae are normal. Pupils are equal, round, and reactive to light.  Neck: Normal range of motion. Neck supple.  Cardiovascular: Normal rate, regular rhythm and normal heart sounds.  Exam reveals no gallop and no friction rub.   No murmur heard. Pulses:      Dorsalis pedis  pulses are 2+ on the right side.  Pulmonary/Chest: Effort normal and breath sounds normal. She has no wheezes. She has no rales.  Musculoskeletal:       Right ankle: She exhibits laceration. She exhibits normal range of motion, no swelling, no ecchymosis, no deformity and normal pulse. No tenderness. No lateral malleolus and no medial malleolus tenderness found. Achilles tendon normal.       Legs:      Right foot: Normal. There is normal range of motion.  35mm x 57mm laceration with active bleeding at superior aspect of wound.    Neurological: She is alert and oriented to person, place, and time.  Skin: She is not diaphoretic.  Psychiatric: She has a normal mood and affect. Her behavior is normal.  Nursing note and vitals reviewed.       Assessment & Plan:   1. Laceration of leg, right, initial encounter    -New. -s/p suture repair as outlined. -local wound care reviewed in detail with patient during visit. -RTC 7-10 days for suture removal.    No orders of the defined types were placed in this encounter.    No Follow-up on file.    Kristi Elayne Guerin, M.D. [facility] [address] [place], [place]  [address] [phone] phone [phone] fax

## 2014-07-04 ENCOUNTER — Ambulatory Visit (INDEPENDENT_AMBULATORY_CARE_PROVIDER_SITE_OTHER): Payer: 59 | Admitting: Physician Assistant

## 2014-07-04 VITALS — BP 152/84 | HR 79 | Temp 98.3°F | Resp 18 | Wt 206.0 lb

## 2014-07-04 DIAGNOSIS — S81811D Laceration without foreign body, right lower leg, subsequent encounter: Secondary | ICD-10-CM

## 2014-07-04 DIAGNOSIS — Z4802 Encounter for removal of sutures: Secondary | ICD-10-CM

## 2014-07-04 NOTE — Progress Notes (Signed)
Patient ID: Sharon Daniels, female    DOB: [birth year], 64 y.o.   MRN: [id number]  PCP: Rachell Cipro, MD   Subjective:  HPI Pt is a 64 y/o female presenting to clinic for removal of sutures.  She was seen 10 days ago in clinic for repair of a laceration to her anterior right ankle after dropping a ceramic bowl. 4 vertical mattress sutures were placed to close the wound. Pt says she has kept the wound clean, dry, and uncovered after the first 2 days. She did not have any issues until 2 days ago when she worked a 10 hour day at work, spending a lot of time on her feet. She came home from work and her right foot was swollen and painful. She is concerned of infection. Pt denies any drainage from the area, warmth around wound, and fever/chills.  Review of Systems  Constitutional: Negative.   Respiratory: Negative.   Cardiovascular: Negative.   Gastrointestinal: Negative for nausea and vomiting.  Skin: Positive for color change (area immediately surrounding wound is mildly pink) and wound (2 cm on anterior right ankle). Negative for pallor and rash.  Neurological: Negative for weakness and numbness.  Hematological: Negative for adenopathy.     Patient Active Problem List   Diagnosis Date Noted  . DVT, lower extremity, distal, acute 06/17/2013  . Ileus, postoperative 06/17/2013  . 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 Hypokalemia 06/17/2013  . Acute pulmonary embolism 06/13/2013  . Postoperative anemia due to acute blood loss 06/09/2013  . Transfusion history 06/09/2013  . OA (osteoarthritis) of knee 06/05/2013  . HIATAL HERNIA 06/18/2007  . FATTY LIVER DISEASE 06/18/2007    Past Medical History  Diagnosis Date  . Allergy   . Arthritis   . Substance abuse   . Anemia   . Hypertension   . H/O bronchitis   . Pneumonia     hx of 10 years ago  . Depression     hx of  . History of kidney stones     x3  . Complication of anesthesia     "during breast surgery: could  hear the surgery, wake up too soon, anxious  . Acute pulmonary embolism 06/13/2013    CT angio chest 06/12/13    Prior to Admission medications   Medication Sig Start Date End Date Taking? Authorizing Provider  Cholecalciferol (VITAMIN D3) 1000 UNITS CAPS Take by mouth. 10/29/11  Yes Historical Provider, MD  DULoxetine (CYMBALTA) 60 MG capsule Take 60 mg by mouth daily.   Yes Historical Provider, MD  estradiol (ESTRACE) 0.1 MG/GM vaginal cream USE 1 GRAM VAGINALLY DAILY 01/08/12  Yes Historical Provider, MD  fluticasone (FLONASE) 50 MCG/ACT nasal spray  01/16/13  Yes Historical Provider, MD  loratadine (CLARITIN) 10 MG tablet Take 10 mg by mouth daily.   Yes Historical Provider, MD  montelukast (SINGULAIR) 10 MG tablet Take 10 mg by mouth daily.   Yes Historical Provider, MD  nicotine polacrilex (COMMIT) 2 MG lozenge 2 mg. 12/19/11  Yes Historical Provider, MD  nystatin (MYCOSTATIN/NYSTOP) 100000 UNIT/GM POWD Apply 1 g topically 2 (two) times daily. Under breasts 06/17/13  Yes Ivan Anchors Love, PA-C  omeprazole (PRILOSEC) 40 MG capsule Take 40 mg by mouth daily.   Yes Historical Provider, MD  Propylene Glycol (SYSTANE BALANCE) 0.6 % SOLN Apply 1 drop to eye as needed (Dry eyes).   Yes Historical Provider, MD  valsartan (DIOVAN) 80 MG tablet Take 80 mg by mouth every morning.  Yes Historical Provider, MD  Vitamin E 100 UNITS TABS Take by mouth.   Yes Historical Provider, MD    Allergies  Allergen Reactions  . Morphine Itching    REACTION: rash, IV    Past Medical, Surgical Family and Social History reviewed and updated.   Objective:   Vitals: BP 152/84 mmHg  Pulse 79  Temp(Src) 98.3 F (36.8 C) (Oral)  Resp 18  Wt 206 lb (93.441 kg)  SpO2 98%   Physical Exam  Constitutional: She is oriented to person, place, and time. She appears well-developed and well-nourished. She is cooperative. No distress.  HENT:  Head: Normocephalic and atraumatic.  Cardiovascular: Normal rate, regular  rhythm and normal heart sounds.  Exam reveals no gallop and no friction rub.   No murmur heard. Pulses:      Dorsalis pedis pulses are 2+ on the right side, and 2+ on the left side.       Posterior tibial pulses are 2+ on the right side, and 2+ on the left side.  Pulmonary/Chest: Effort normal and breath sounds normal. No respiratory distress. She has no wheezes. She has no rales.  Lymphadenopathy:       Head (right side): No submental, no submandibular, no tonsillar, no preauricular, no posterior auricular and no occipital adenopathy present.       Head (left side): No submental, no submandibular, no tonsillar, no preauricular, no posterior auricular and no occipital adenopathy present.  Neurological: She is alert and oriented to person, place, and time. She has normal reflexes.  Skin: Skin is warm and dry. Laceration (2 cm healing laceration to right anterior ankle, scab formed, slight pink area immediately surrounding wound.) noted.  Psychiatric: She has a normal mood and affect. Her speech is normal and behavior is normal. Thought content normal.    Assessment & Plan:   Sharon Daniels was seen today for suture / staple removal.  Diagnoses and all orders for this visit:  Encounter for removal of sutures       -      Removed 4 vertical mattress sutures without issues.             -      Area does not appear to be infected. No purulent drainage noted upon removal of sutures.       -      Pink area surrounding the wound looks more like reaction to the suture material rather than infection.       -      Elevate leg after work to help with swelling. Can apply ice and use ibuprofen as needed.       -     Return if she notices any signs or symptoms of infection.     Travas Schexnayder, PA-S [facility] 07/04/2014 8:34 AM

## 2014-07-04 NOTE — Progress Notes (Signed)
Chief Complaint  Patient presents with  . Suture / Staple Removal    History of Present Illness: Patient presents for suture removal.  She was seen 10 days ago in clinic for repair of a laceration to her anterior right ankle after dropping a ceramic bowl. 4 vertical mattress sutures were placed to close the wound. Pt says she has kept the wound clean, dry, and uncovered after the first 2 days. She did not have any issues until 2 days ago when she worked a 10 hour day at work, spending a lot of time on her feet. She came home from work and her right foot was swollen and painful. She is concerned of infection. Pt denies any drainage from the area, warmth around wound, and fever/chills.    Allergies  Allergen Reactions  . Morphine Itching    REACTION: rash, IV    Prior to Admission medications   Medication Sig Start Date End Date Taking? Authorizing Provider  Cholecalciferol (VITAMIN D3) 1000 UNITS CAPS Take by mouth. 10/29/11  Yes Historical Provider, MD  DULoxetine (CYMBALTA) 60 MG capsule Take 60 mg by mouth daily.   Yes Historical Provider, MD  estradiol (ESTRACE) 0.1 MG/GM vaginal cream USE 1 GRAM VAGINALLY DAILY 01/08/12  Yes Historical Provider, MD  fluticasone (FLONASE) 50 MCG/ACT nasal spray  01/16/13  Yes Historical Provider, MD  loratadine (CLARITIN) 10 MG tablet Take 10 mg by mouth daily.   Yes Historical Provider, MD  montelukast (SINGULAIR) 10 MG tablet Take 10 mg by mouth daily.   Yes Historical Provider, MD  nicotine polacrilex (COMMIT) 2 MG lozenge 2 mg. 12/19/11  Yes Historical Provider, MD  nystatin (MYCOSTATIN/NYSTOP) 100000 UNIT/GM POWD Apply 1 g topically 2 (two) times daily. Under breasts 06/17/13  Yes Ivan Anchors Love, PA-C  omeprazole (PRILOSEC) 40 MG capsule Take 40 mg by mouth daily.   Yes Historical Provider, MD  Propylene Glycol (SYSTANE BALANCE) 0.6 % SOLN Apply 1 drop to eye as needed (Dry eyes).   Yes Historical Provider, MD  valsartan (DIOVAN) 80 MG tablet Take  80 mg by mouth every morning.    Yes Historical Provider, MD  Vitamin E 100 UNITS TABS Take by mouth.   Yes Historical Provider, MD    Patient Active Problem List   Diagnosis Date Noted  . DVT, lower extremity, distal, acute 06/17/2013  . Ileus, postoperative 06/17/2013  . 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 Hypokalemia 06/17/2013  . Acute pulmonary embolism 06/13/2013  . Postoperative anemia due to acute blood loss 06/09/2013  . Transfusion history 06/09/2013  . OA (osteoarthritis) of knee 06/05/2013  . HIATAL HERNIA 06/18/2007  . FATTY LIVER DISEASE 06/18/2007     Physical Exam  Constitutional: She appears well-developed and well-nourished. No distress.  Pulmonary/Chest: Effort normal.  Musculoskeletal:       Right ankle: She exhibits swelling (consistent with trauma to foot and long day standing.). She exhibits normal range of motion, no ecchymosis and normal pulse.  Skin: Skin is warm and dry. Laceration (well healed laceration on the anterior aspect of the RIGHT ankle/dorsum of the foot. Sutures intact. Minimal suture reaction. No cellulitis. Sutures removed without incident.) noted. She is not diaphoretic. No pallor.  Psychiatric: She has a normal mood and affect. Her speech is normal and behavior is normal.      ASSESSMENT & PLAN:  1. Encounter for removal of sutures 2. Laceration of leg, right, subsequent encounter Local wound care. Anticipatory guidance. No evidence of cellulitis. Elevate leg to  reduce swelling. RTC if develops increased redness, pain, drainage.   Fara Chute, PA-C Physician Assistant-Certified [facility]

## 2014-09-22 ENCOUNTER — Telehealth: Payer: Self-pay | Admitting: Internal Medicine

## 2014-09-22 NOTE — Telephone Encounter (Signed)
Received records from Dr. Rachell [facility] forwarded 7 pages to Dr. Olevia Perches 09/22/14 fbg.

## 2015-01-19 MED FILL — MONTELUKAST SOD 10 MG TAB: 10 | 90 days supply | Qty: 90 | Fill #3

## 2015-01-19 MED FILL — VALSARTAN 80 MG TABLET: 80 | 90 days supply | Qty: 90 | Fill #1

## 2015-01-19 MED FILL — DULoxetine HCL 60 MG CPEP: 60 | 90 days supply | Qty: 90 | Fill #1

## 2015-02-21 DIAGNOSIS — Z0000 Encounter for general adult medical examination without abnormal findings: Secondary | ICD-10-CM | POA: Diagnosis not present

## 2015-03-14 DIAGNOSIS — Z1211 Encounter for screening for malignant neoplasm of colon: Secondary | ICD-10-CM | POA: Diagnosis not present

## 2015-03-14 DIAGNOSIS — Z0000 Encounter for general adult medical examination without abnormal findings: Secondary | ICD-10-CM | POA: Diagnosis not present

## 2015-03-17 ENCOUNTER — Other Ambulatory Visit: Payer: Self-pay

## 2015-03-17 ENCOUNTER — Other Ambulatory Visit: Payer: Self-pay | Admitting: Family Medicine

## 2015-03-17 DIAGNOSIS — Z1231 Encounter for screening mammogram for malignant neoplasm of breast: Secondary | ICD-10-CM

## 2015-03-17 DIAGNOSIS — M8580 Other specified disorders of bone density and structure, unspecified site: Secondary | ICD-10-CM

## 2015-03-28 DIAGNOSIS — N62 Hypertrophy of breast: Secondary | ICD-10-CM | POA: Diagnosis not present

## 2015-03-28 DIAGNOSIS — K429 Umbilical hernia without obstruction or gangrene: Secondary | ICD-10-CM | POA: Diagnosis not present

## 2015-04-04 DIAGNOSIS — M810 Age-related osteoporosis without current pathological fracture: Secondary | ICD-10-CM | POA: Diagnosis not present

## 2015-04-04 DIAGNOSIS — Z803 Family history of malignant neoplasm of breast: Secondary | ICD-10-CM | POA: Diagnosis not present

## 2015-04-04 DIAGNOSIS — Z1231 Encounter for screening mammogram for malignant neoplasm of breast: Secondary | ICD-10-CM | POA: Diagnosis not present

## 2015-04-12 DIAGNOSIS — H2513 Age-related nuclear cataract, bilateral: Secondary | ICD-10-CM | POA: Diagnosis not present

## 2015-04-12 DIAGNOSIS — H04123 Dry eye syndrome of bilateral lacrimal glands: Secondary | ICD-10-CM | POA: Diagnosis not present

## 2015-04-12 DIAGNOSIS — H5203 Hypermetropia, bilateral: Secondary | ICD-10-CM | POA: Diagnosis not present

## 2015-04-12 DIAGNOSIS — H524 Presbyopia: Secondary | ICD-10-CM | POA: Diagnosis not present

## 2015-04-12 DIAGNOSIS — H52223 Regular astigmatism, bilateral: Secondary | ICD-10-CM | POA: Diagnosis not present

## 2015-04-21 ENCOUNTER — Encounter: Payer: Self-pay | Admitting: Gynecology

## 2015-04-21 ENCOUNTER — Ambulatory Visit (INDEPENDENT_AMBULATORY_CARE_PROVIDER_SITE_OTHER): Payer: 59 | Admitting: Gynecology

## 2015-04-21 VITALS — BP 112/76 | Ht 66.5 in | Wt 191.0 lb

## 2015-04-21 DIAGNOSIS — Z1159 Encounter for screening for other viral diseases: Secondary | ICD-10-CM | POA: Diagnosis not present

## 2015-04-21 DIAGNOSIS — Z86711 Personal history of pulmonary embolism: Secondary | ICD-10-CM | POA: Diagnosis not present

## 2015-04-21 DIAGNOSIS — N898 Other specified noninflammatory disorders of vagina: Secondary | ICD-10-CM | POA: Diagnosis not present

## 2015-04-21 DIAGNOSIS — Z01419 Encounter for gynecological examination (general) (routine) without abnormal findings: Secondary | ICD-10-CM | POA: Diagnosis not present

## 2015-04-21 DIAGNOSIS — Z860100 Personal history of colon polyps, unspecified: Secondary | ICD-10-CM | POA: Insufficient documentation

## 2015-04-21 DIAGNOSIS — Z780 Asymptomatic menopausal state: Secondary | ICD-10-CM | POA: Diagnosis not present

## 2015-04-21 NOTE — Progress Notes (Signed)
Sharon Daniels April 28, 1950 [id number]   History:    65 y.o.  for annual gyn exam who has not been seen the practice since 2003. She had a Pap smear with her primary physician 5 years ago. We do not have her medical records. Patient wanted to be tested for hep C. 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 Patient complaining only vaginal dryness. Her PCP is Dr. Ernie Hew who is been doing her blood work and order recently last month her mammogram and bone density study that we do not have the result. Patient does smoke one pack cigarette per day and says that she will be stopping this habit this week.  Past medical history,surgical history, family history and social history were all reviewed and documented in the EPIC chart.  Gynecologic History No LMP recorded. Patient is postmenopausal. Contraception: post menopausal status Last Pap: 2012. Results were: normal Last mammogram: 2017. Results were: Patient reports it was normal we do not have any reports  Obstetric History OB History  Gravida Para Term Preterm AB SAB TAB Ectopic Multiple Living  3 2   1 1    2     # Outcome Date GA Lbr Len/2nd Weight Sex Delivery Anes PTL Lv  3 SAB           2 Para           1 Para                ROS: A ROS was performed and pertinent positives and negatives are included in the history.  GENERAL: No fevers or chills. HEENT: No change in vision, no earache, sore throat or sinus congestion. NECK: No pain or stiffness. CARDIOVASCULAR: No chest pain or pressure. No palpitations. PULMONARY: No shortness of breath, cough or wheeze. GASTROINTESTINAL: No abdominal pain, nausea, vomiting or diarrhea, melena or bright red blood per rectum. GENITOURINARY: No urinary frequency, urgency, hesitancy or dysuria. MUSCULOSKELETAL: No joint or muscle pain, no back pain, no recent trauma. DERMATOLOGIC: No rash, no itching,  no lesions. ENDOCRINE: No polyuria, polydipsia, no heat or cold intolerance. No recent change in weight. HEMATOLOGICAL: No anemia or easy bruising or bleeding. NEUROLOGIC: No headache, seizures, numbness, tingling or weakness. PSYCHIATRIC: No depression, no loss of interest in normal activity or change in sleep pattern.     Exam: chaperone present  BP 112/76 mmHg  Ht 5' 6.5" (1.689 m)  Wt 191 lb (86.637 kg)  BMI 30.37 kg/m2  Body mass index is 30.37 kg/(m^2).  General appearance : Well developed well nourished female. No acute distress HEENT: Eyes: no retinal hemorrhage or exudates,  Neck supple, trachea midline, no carotid bruits, no thyroidmegaly Lungs: Clear to auscultation, no rhonchi or wheezes, or rib retractions  Heart: Regular rate and rhythm, no murmurs or gallops Breast:Examined in sitting and supine position were symmetrical in appearance, no palpable masses or tenderness,  no skin retraction, no nipple inversion, no nipple discharge, no skin discoloration, no axillary or supraclavicular lymphadenopathy Abdomen: no palpable masses or tenderness, no rebound or guarding Extremities: no edema or skin discoloration or tenderness  Pelvic:  Bartholin, Urethra, Skene Glands: Within normal limits             Vagina: No gross lesions or discharge, atrophic changes  Cervix: No gross lesions or discharge  Uterus  anteverted, normal size, shape and consistency, non-tender and mobile  Adnexa  Without masses or tenderness  Anus and perineum  normal   Rectovaginal  normal sphincter tone without palpated masses or tenderness             Hemoccult cards will be provided     Assessment/Plan:  65 y.o. female for annual exam with smoking history of one pack per day she was counseled and she says she will taper off this week. 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 Patient complaining of vaginal dryness I've recommended she use over-the-counter nonhormonal  vaginal lubricants such as Replens and use a 3 times a week. Pap smear with HPV screening was done today PCP doing her blood work. We have requested release of her last mammogram and bone density study for review. We discussed importance of calcium vitamin D and regular exercise for osteoporosis prevention.  New CDC guidelines is recommending patients be tested once in her lifetime for hepatitis C antibody who were born between [birth year] through 1964. This was discussed with the patient today and has agreed to be tested today.   Terrance Mass MD, 4:15 PM 04/21/2015

## 2015-04-22 LAB — HEPATITIS C ANTIBODY: HCV Ab: NEGATIVE

## 2015-04-22 LAB — PAP, TP IMAGING W/ HPV RNA, RFLX HPV TYPE 16,18/45: HPV mRNA, High Risk: NOT DETECTED

## 2015-04-25 ENCOUNTER — Encounter: Payer: Self-pay | Admitting: Gynecology

## 2015-04-26 ENCOUNTER — Telehealth: Payer: Self-pay | Admitting: *Deleted

## 2015-04-26 DIAGNOSIS — M8580 Other specified disorders of bone density and structure, unspecified site: Secondary | ICD-10-CM

## 2015-04-26 MED FILL — MONTELUKAST SOD 10 MG TAB: 10 | 90 days supply | Qty: 90 | Fill #0

## 2015-04-26 MED FILL — DULoxetine HCL 60 MG CPEP: 60 | 90 days supply | Qty: 90 | Fill #0

## 2015-04-26 MED FILL — VALSARTAN 80 MG TABLET: 80 | 90 days supply | Qty: 90 | Fill #0

## 2015-04-26 NOTE — Telephone Encounter (Signed)
Per JF note pt will need PTH, Vitamin D, calcium level and consult appointment with provider after labs done. Order placed, pt aware.

## 2015-04-29 ENCOUNTER — Other Ambulatory Visit: Payer: 59

## 2015-04-29 DIAGNOSIS — M8580 Other specified disorders of bone density and structure, unspecified site: Secondary | ICD-10-CM

## 2015-04-30 LAB — VITAMIN D 25 HYDROXY (VIT D DEFICIENCY, FRACTURES): VIT D 25 HYDROXY: 32 ng/mL (ref 30–100)

## 2015-05-02 LAB — PTH, INTACT AND CALCIUM
CALCIUM: 9.4 mg/dL (ref 8.4–10.5)
PTH: 52 pg/mL (ref 14–64)

## 2015-05-12 ENCOUNTER — Ambulatory Visit (INDEPENDENT_AMBULATORY_CARE_PROVIDER_SITE_OTHER): Payer: 59 | Admitting: Gynecology

## 2015-05-12 ENCOUNTER — Telehealth: Payer: Self-pay | Admitting: Gynecology

## 2015-05-12 ENCOUNTER — Encounter: Payer: Self-pay | Admitting: Gynecology

## 2015-05-12 VITALS — BP 138/76

## 2015-05-12 DIAGNOSIS — Z9114 Patient's other noncompliance with medication regimen: Secondary | ICD-10-CM | POA: Diagnosis not present

## 2015-05-12 DIAGNOSIS — M810 Age-related osteoporosis without current pathological fracture: Secondary | ICD-10-CM | POA: Diagnosis not present

## 2015-05-12 DIAGNOSIS — K219 Gastro-esophageal reflux disease without esophagitis: Secondary | ICD-10-CM | POA: Diagnosis not present

## 2015-05-12 NOTE — Progress Notes (Signed)
Patient is a 65 year old was seen in the office in April 13 for her annual exam prior to that it had been 2003. She was here today as a result of her bone density study done here in the office which indicated that she was osteoporotic and was here for consultation. I have no other bone density study to compare with but patient stated in the past she had been diagnosed with osteopenia and at one point in the past she had been prescribed Boniva and Actonel but had issues with compliance and remembering to take the medication. She has also suffered in the past from gastroesophageal reflux disease. Patient has informed me that she continues to smoke a pack cigarette per day but is working on it. 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  Bone density done on 04/04/2015 at [facility] demonstrated the following: Right femoral neck T score -2.0 when compared with 2013 no statistically significant change. Left femoral neck T score -2.70 with -12% change in bone mineralization 1 compare with 2013 significant AP T score -6.0 with a negative a percent change in bone mineralization from previous scan 2013.  Based on this analysis patient bone mass 25% below normal and has an 11 time greater risk of hip fracture indicating need for treatment.  Patient was asked to have a calcium, vitamin D and PTH level drawn before today's consultation and all within normal range.  We had a lengthy discussion of osteoporosis. We discussed different treatment options to include oral anti-resorptive agents, to subcutaneous every 6 months injections as well as annual IV infusion, or daily injections. Because of patient's compliance issue and past history of gastroesophageal reflux I have recommended one of the 2 options:Prolia 60 mg  subcutaneous every 6 months or Reclast IV infusion yearly.  The following was also discussed in detail and information provided: The risk and benefits of Prolia were discussed with the patient today to include a significant risk reduction for vertebral, hip and non-vertebral fractures. Potential risk also discussed were skin advanced to include rash, pruritus, eczema and other skin reactions. The recurrence of infections and clinical studies with PROLIA, including serious infections were also discussed with the patient. In 3 year clinical trial with Prolia although six-year trial data does not show any evidence for any additional or accelerating risk of infection. Other risks that were discussed included a 1 in 1000-10,000 risk of osteonecrosis of the jaw based on current available data  The risk and benefits of IV bisphosphonate were discussed with the patient today to include a significant risk reduction for vertebral, hip and non-vertebral fractures. Potential risk of therapy were also discussed to include a 10-15% chance for a flulike reaction which may last 2-3 days after IV bisphosphonate. We also discussed that longer reactions lasting perhaps weeks to months have been rarely reported to the FDA following IV bisphosphonate treatment. This would also require symptomatic management. Other potential risk that were discussed included the following: Regular eye irritation, approximately 1 and 1000-10,000 risk for osteonecrosis of the jaw as well as a risk for atypical femoral fracture of approximately 1 in 5000-10,000 based on current data on oral bisphosphonate. Signs and symptoms of atypical femoral fracture were also discussed and the patient was asked to contact the office if she develops any of  these symptoms.  Patient has decided to proceed with the Prolia 60 mg subcutaneous every 6 months. We'll make arrangements.  Greater than 50% time was spent in counseling quantity care for this patient with  osteoporosis. Time of consultation 25 minutes

## 2015-05-12 NOTE — Telephone Encounter (Signed)
Sharon Daniels need approval from patients insurance company for [employer]. Patient has compliance with meds like Boniva, Fosomax. She also has had history of GERD. Labs recently drawn . Note from Dr Toney Rakes. Calcium 9.4 on 04/29/15

## 2015-05-12 NOTE — Patient Instructions (Signed)
Denosumab injection What is this medicine? DENOSUMAB (den oh sue mab) slows bone breakdown. Prolia is used to treat osteoporosis in women after menopause and in men. Xgeva is used to prevent bone fractures and other bone problems caused by cancer bone metastases. Xgeva is also used to treat giant cell tumor of the bone. This medicine may be used for other purposes; ask your health care provider or pharmacist if you have questions. What should I tell my health care provider before I take this medicine? They need to know if you have any of these conditions: -dental disease -eczema -infection or history of infections -kidney disease or on dialysis -low blood calcium or vitamin D -malabsorption syndrome -scheduled to have surgery or tooth extraction -taking medicine that contains denosumab -thyroid or parathyroid disease -an unusual reaction to denosumab, other medicines, foods, dyes, or preservatives -pregnant or trying to get pregnant -breast-feeding How should I use this medicine? This medicine is for injection under the skin. It is given by a health care professional in a hospital or clinic setting. If you are getting Prolia, a special MedGuide will be given to you by the pharmacist with each prescription and refill. Be sure to read this information carefully each time. For Prolia, talk to your pediatrician regarding the use of this medicine in children. Special care may be needed. For Xgeva, talk to your pediatrician regarding the use of this medicine in children. While this drug may be prescribed for children as young as 13 years for selected conditions, precautions do apply. Overdosage: If you think you have taken too much of this medicine contact a poison control center or emergency room at once. NOTE: This medicine is only for you. Do not share this medicine with others. What if I miss a dose? It is important not to miss your dose. Call your doctor or health care professional if you are  unable to keep an appointment. What may interact with this medicine? Do not take this medicine with any of the following medications: -other medicines containing denosumab This medicine may also interact with the following medications: -medicines that suppress the immune system -medicines that treat cancer -steroid medicines like prednisone or cortisone This list may not describe all possible interactions. Give your health care provider a list of all the medicines, herbs, non-prescription drugs, or dietary supplements you use. Also tell them if you smoke, drink alcohol, or use illegal drugs. Some items may interact with your medicine. What should I watch for while using this medicine? Visit your doctor or health care professional for regular checks on your progress. Your doctor or health care professional may order blood tests and other tests to see how you are doing. Call your doctor or health care professional if you get a cold or other infection while receiving this medicine. Do not treat yourself. This medicine may decrease your body's ability to fight infection. You should make sure you get enough calcium and vitamin D while you are taking this medicine, unless your doctor tells you not to. Discuss the foods you eat and the vitamins you take with your health care professional. See your dentist regularly. Brush and floss your teeth as directed. Before you have any dental work done, tell your dentist you are receiving this medicine. Do not become pregnant while taking this medicine or for 5 months after stopping it. Women should inform their doctor if they wish to become pregnant or think they might be pregnant. There is a potential for serious side effects   to an unborn child. Talk to your health care professional or pharmacist for more information. What side effects may I notice from receiving this medicine? Side effects that you should report to your doctor or health care professional as soon as  possible: -allergic reactions like skin rash, itching or hives, swelling of the face, lips, or tongue -breathing problems -chest pain -fast, irregular heartbeat -feeling faint or lightheaded, falls -fever, chills, or any other sign of infection -muscle spasms, tightening, or twitches -numbness or tingling -skin blisters or bumps, or is dry, peels, or red -slow healing or unexplained pain in the mouth or jaw -unusual bleeding or bruising Side effects that usually do not require medical attention (Report these to your doctor or health care professional if they continue or are bothersome.): -muscle pain -stomach upset, gas This list may not describe all possible side effects. Call your doctor for medical advice about side effects. You may report side effects to FDA at 1-800-FDA-1088. Where should I keep my medicine? This medicine is only given in a clinic, doctor's office, or other health care setting and will not be stored at home. NOTE: This sheet is a summary. It may not cover all possible information. If you have questions about this medicine, talk to your doctor, pharmacist, or health care provider.    2016, Elsevier/Gold Standard. (2011-06-25 12:37:47) Osteoporosis Osteoporosis is the thinning and loss of density in the bones. Osteoporosis makes the bones more brittle, fragile, and likely to break (fracture). Over time, osteoporosis can cause the bones to become so weak that they fracture after a simple fall. The bones most likely to fracture are the bones in the hip, wrist, and spine. CAUSES  The exact cause is not known. RISK FACTORS Anyone can develop osteoporosis. You may be at greater risk if you have a family history of the condition or have poor nutrition. You may also have a higher risk if you are:   Female.   50 years old or older.  A smoker.  Not physically active.   White or Asian.  Slender. SIGNS AND SYMPTOMS  A fracture might be the first sign of the  disease, especially if it results from a fall or injury that would not usually cause a bone to break. Other signs and symptoms include:   Low back and neck pain.  Stooped posture.  Height loss. DIAGNOSIS  To make a diagnosis, your health care provider may:  Take a medical history.  Perform a physical exam.  Order tests, such as:  A bone mineral density test.  A dual-energy X-ray absorptiometry test. TREATMENT  The goal of osteoporosis treatment is to strengthen your bones to reduce your risk of a fracture. Treatment may involve:  Making lifestyle changes, such as:  Eating a diet rich in calcium.  Doing weight-bearing and muscle-strengthening exercises.  Stopping tobacco use.  Limiting alcohol intake.  Taking medicine to slow the process of bone loss or to increase bone density.  Monitoring your levels of calcium and vitamin D. HOME CARE INSTRUCTIONS  Include calcium and vitamin D in your diet. Calcium is important for bone health, and vitamin D helps the body absorb calcium.  Perform weight-bearing and muscle-strengthening exercises as directed by your health care provider.  Do not use any tobacco products, including cigarettes, chewing tobacco, and electronic cigarettes. If you need help quitting, ask your health care provider.  Limit your alcohol intake.  Take medicines only as directed by your health care provider.  Keep all follow-up   visits as directed by your health care provider. This is important.  Take precautions at home to lower your risk of falling, such as:  Keeping rooms well lit and clutter free.  Installing safety rails on stairs.  Using rubber mats in the bathroom and other areas that are often wet or slippery. SEEK IMMEDIATE MEDICAL CARE IF:  You fall or injure yourself.    This information is not intended to replace advice given to you by your health care provider. Make sure you discuss any questions you have with your health care  provider.   Document Released: 10/04/2004 Document Revised: 01/15/2014 Document Reviewed: 06/04/2013 Elsevier Interactive Patient Education 2016 Elsevier Inc.  

## 2015-05-24 NOTE — Telephone Encounter (Signed)
PC to pt. Insurance benefits are No deductible. Co ins 20% approx $420 , with OV co pay $40. OOPM [id number] ($66 met), Calcium  9.4 04/29/15 . Patient was seen by dentist and advised to consider other options due to past history of dental implants. Patient stated that she would like to read information about Prolia . Patient also stated that in the past her compliance issue was simply not taking the medication. She also is considering cosmetic surgery and asked if she should wait until after surgery for injection if she decides to take Prolia. I told her I would inform Dr Toney Rakes.

## 2015-05-24 NOTE — Telephone Encounter (Signed)
If she wants to wait she can't she has mild osteoporosis not severe may sure she takes her calcium and vitamin D and weightbearing exercises

## 2015-05-24 NOTE — Telephone Encounter (Signed)
Called pt and informed her of Dr Toney Rakes note. She is ok with waiting.

## 2015-05-25 ENCOUNTER — Encounter: Payer: Self-pay | Admitting: Gynecology

## 2015-06-13 DIAGNOSIS — M810 Age-related osteoporosis without current pathological fracture: Secondary | ICD-10-CM | POA: Diagnosis not present

## 2015-06-13 DIAGNOSIS — E559 Vitamin D deficiency, unspecified: Secondary | ICD-10-CM | POA: Diagnosis not present

## 2015-06-13 DIAGNOSIS — M19049 Primary osteoarthritis, unspecified hand: Secondary | ICD-10-CM | POA: Diagnosis not present

## 2015-06-13 MED FILL — DICLOFENAC SODIUM 1% GEL: 1 | 7 days supply | Qty: 300 | Fill #0

## 2015-07-24 IMAGING — CR DG ABDOMEN 1V
2 series · 2 of 2 positions shown · non-contrast
Comparison: CT abdomen and pelvis February 06, 2008

CLINICAL DATA: Abdominal distension and obstipation

EXAM:
ABDOMEN - 1 VIEW

[t abdomen supine (1 of 2)]
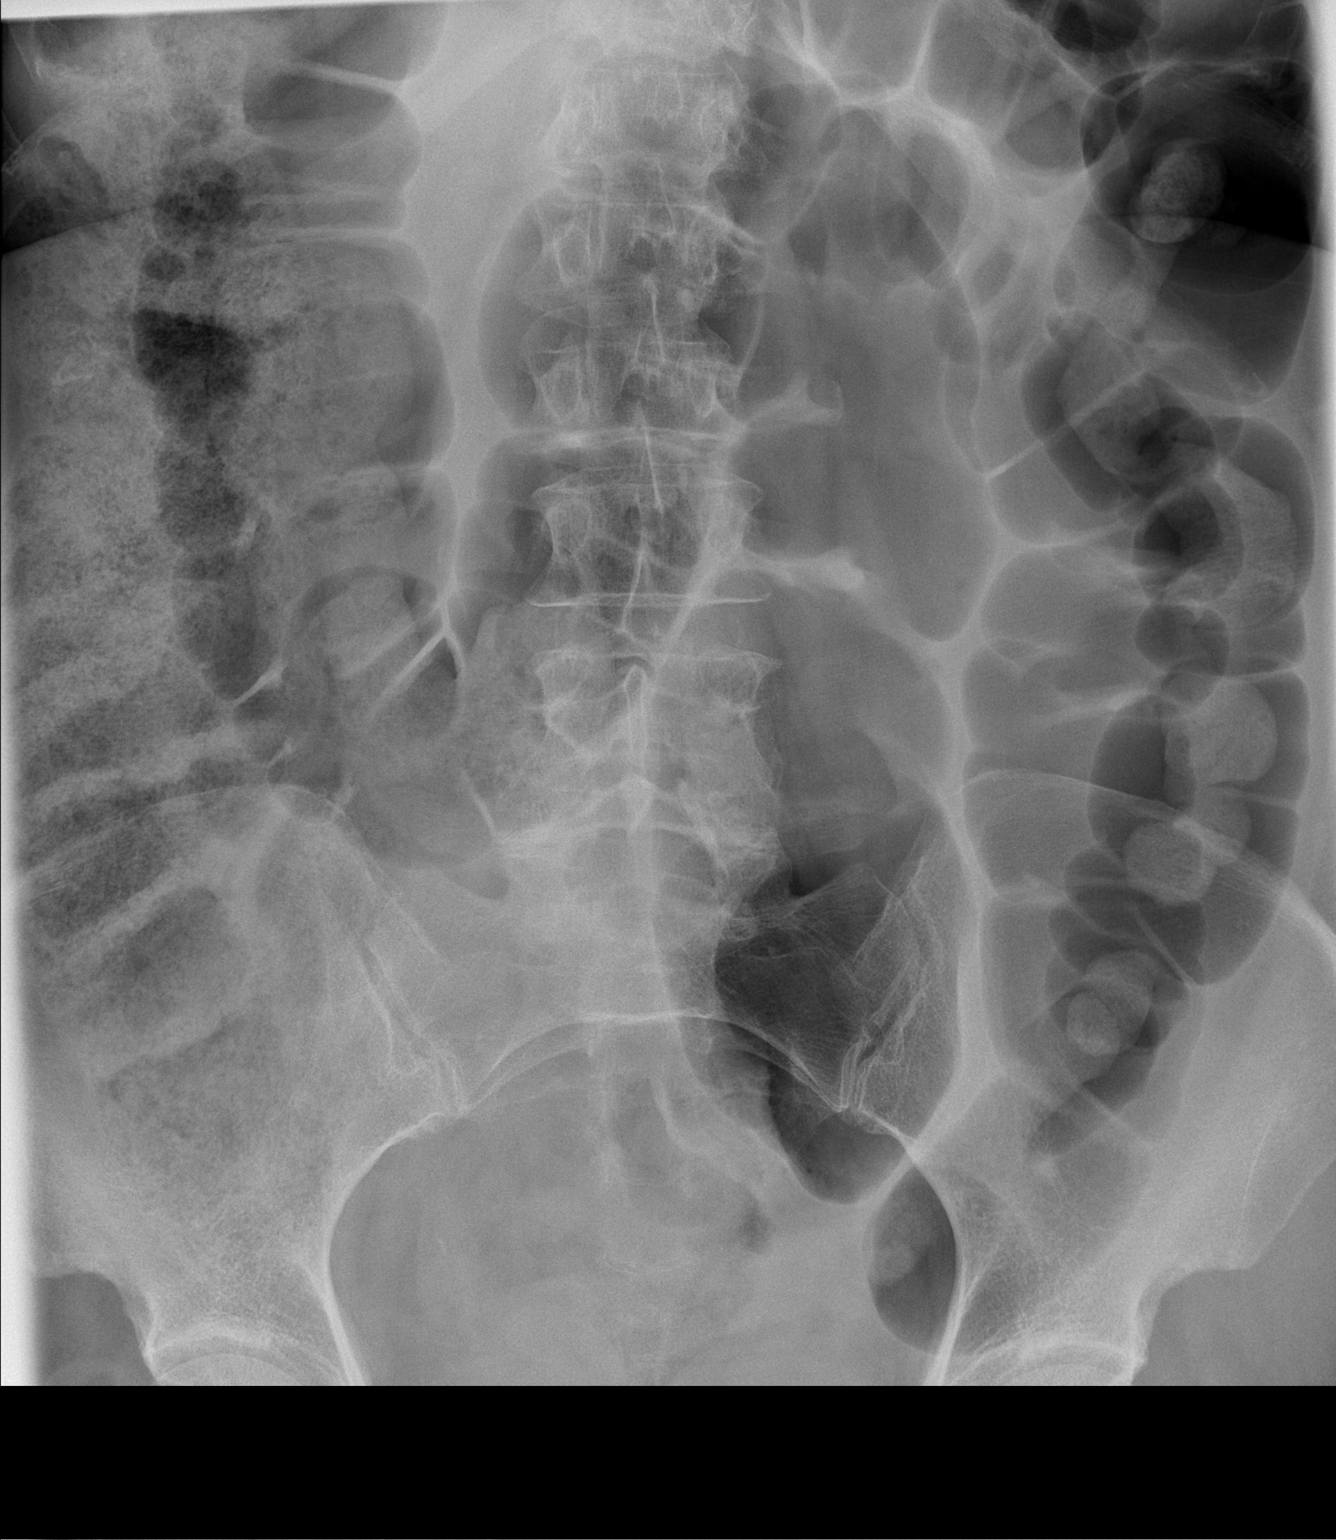

[t abdomen supine (2 of 2)]
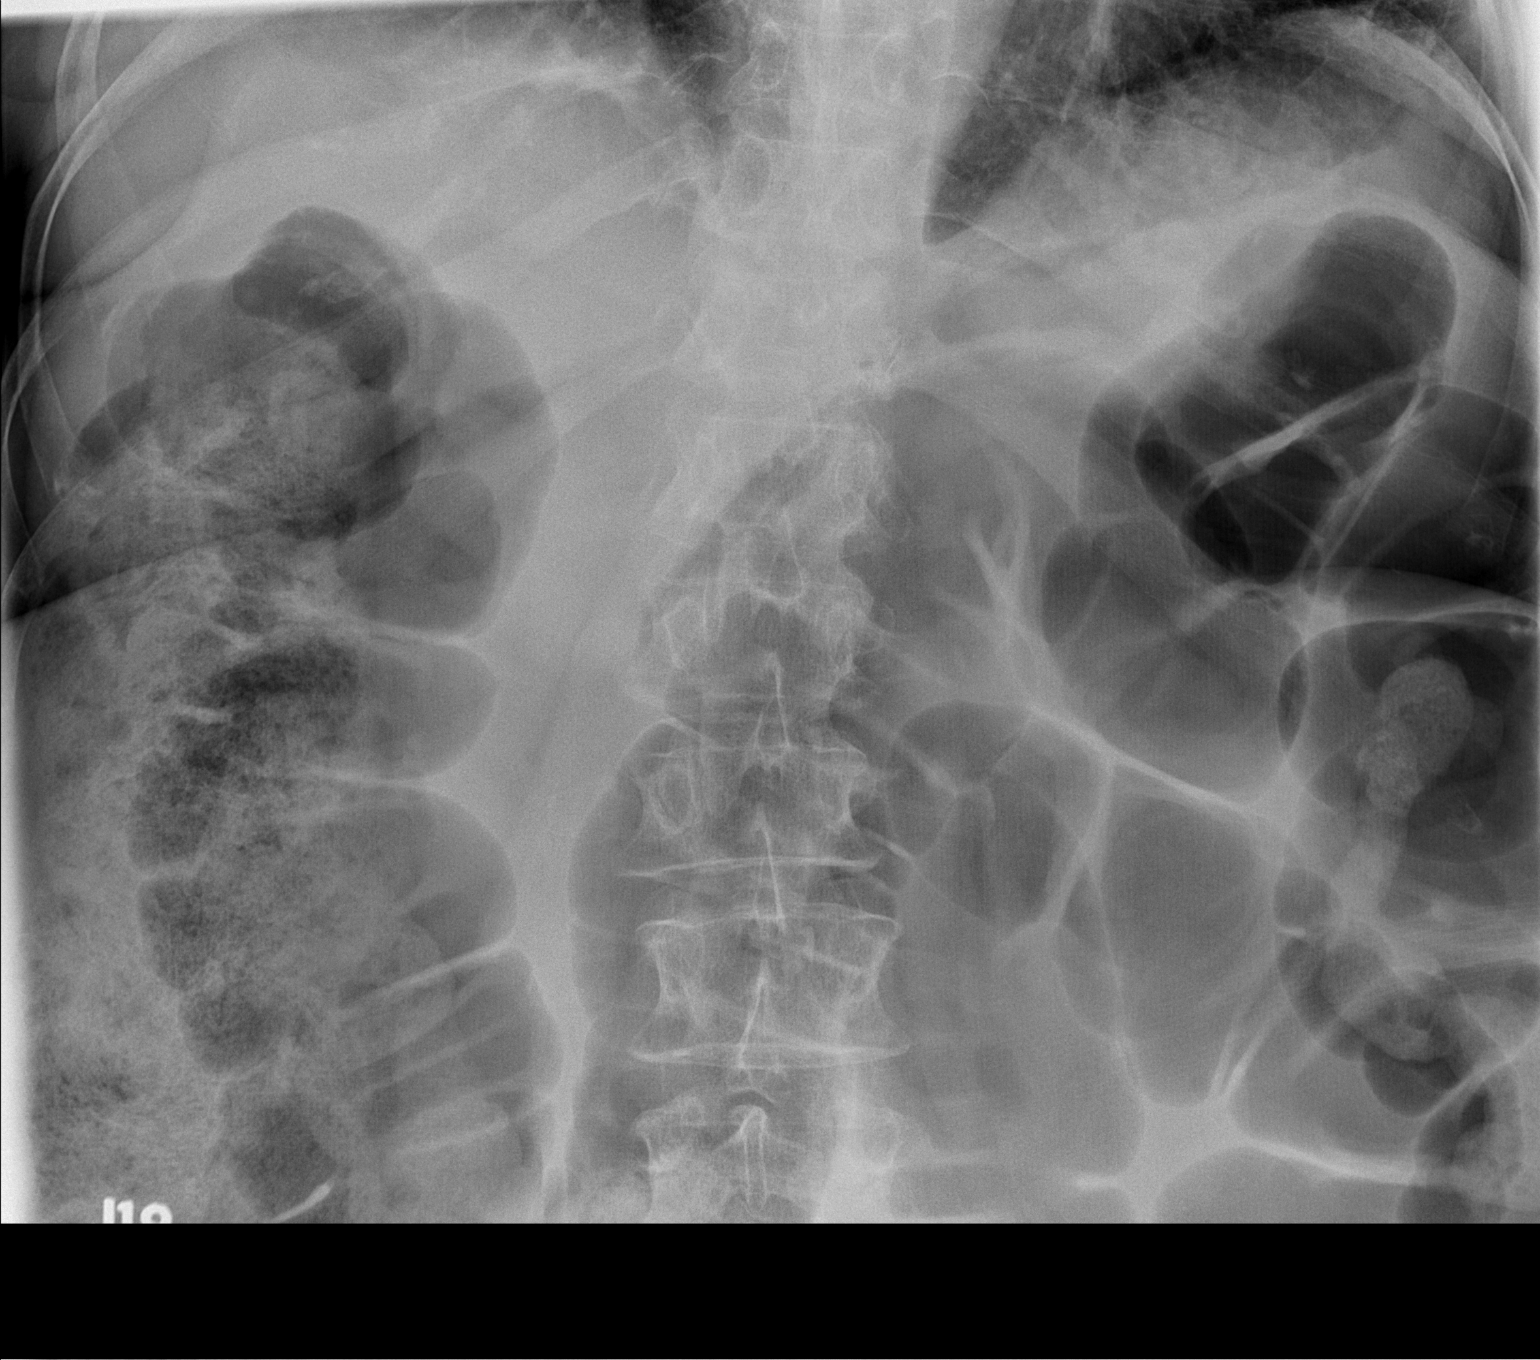

[2 of 2 positions shown; findings below may reference images not displayed]

FINDINGS: There is extensive stool throughout the ascending colon and proximal
transverse colon. Stool is moderate elsewhere. There is mild
generalized colonic dilatation. There is no appreciable small bowel
obstruction. No free air or portal venous air seen on this supine
examination. No abnormal calcifications.
IMPRESSION: Mild generalized colonic dilatation. There is copious stool
throughout the ascending and proximal transverse colon with moderate
stool elsewhere. Question a degree of colonic ileus. Frank bowel
obstruction is not seen. No free air.

## 2015-07-25 IMAGING — CT CT ANGIO CHEST
2 of 9 series · 18 of 46 positions shown · IV contrast (APPLIED)
Comparison: None.

CLINICAL DATA: Worsening hypoxia and increased A-a gradient.
Clinical suspicion for pulmonary embolism.

EXAM:
CT ANGIOGRAPHY CHEST WITH CONTRAST
TECHNIQUE: Multidetector CT imaging of the chest was performed using the
standard protocol during bolus administration of intravenous
contrast. Multiplanar CT image reconstructions and MIPs were
obtained to evaluate the vascular anatomy.
CONTRAST:  100mL OMNIPAQUE IOHEXOL 350 MG/ML SOLN

[Series 5: thins · axial · 0.63mm/px · z∈[+70,+268]mm · 15 of 223 slices shown]
[im 13/223  lung]
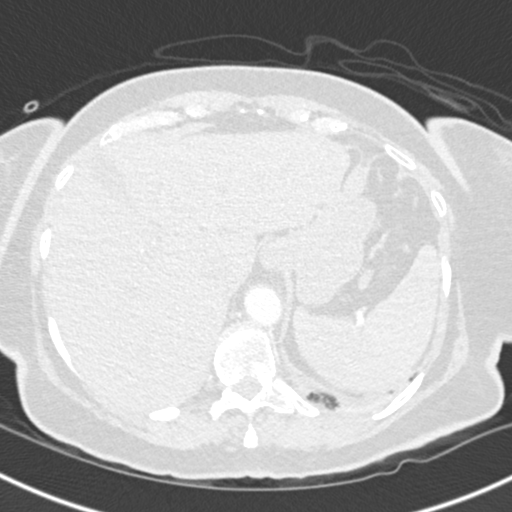
[im 25/223  soft-tissue]
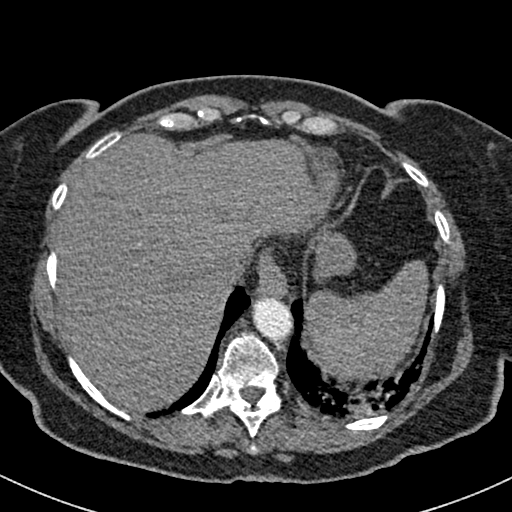
[im 38/223  lung]
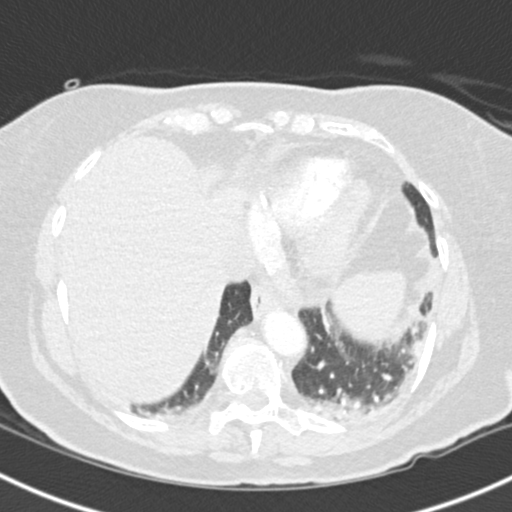
[im 50/223  soft-tissue]
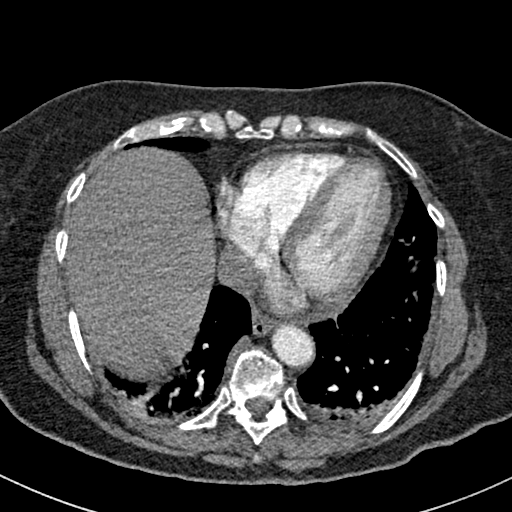
[im 75/223  lung]
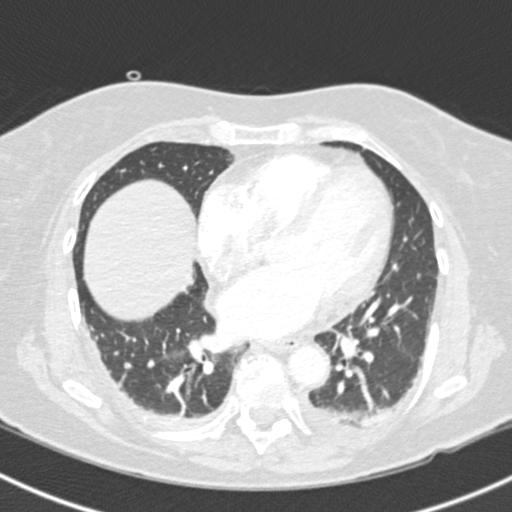
[im 87/223  soft-tissue]
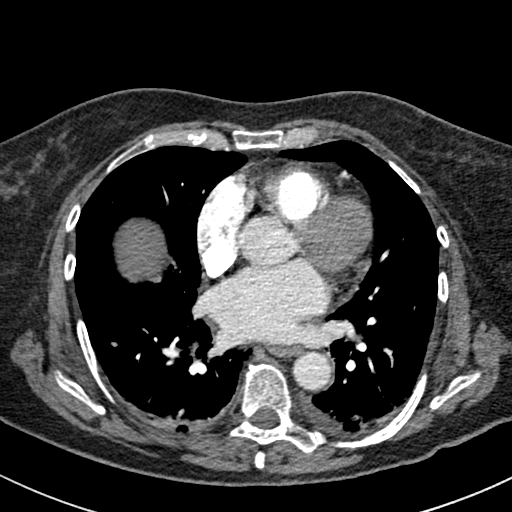
[im 99/223  lung]
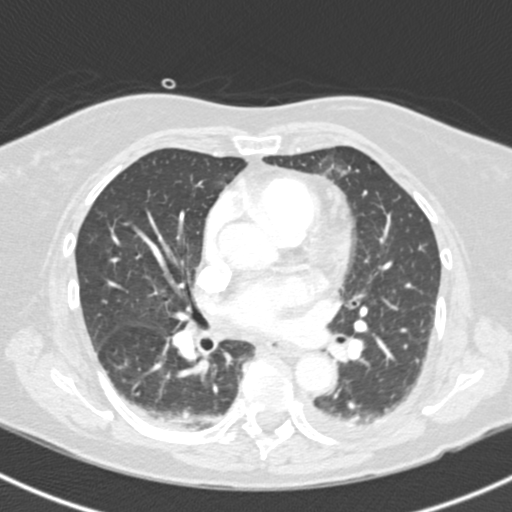
[im 112/223  soft-tissue]
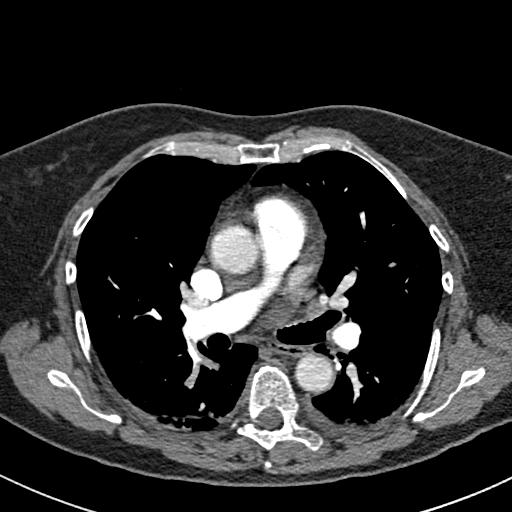
[im 124/223  lung]
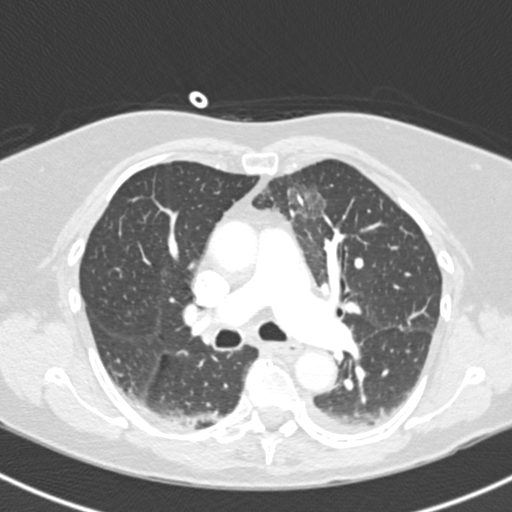
[im 136/223  soft-tissue]
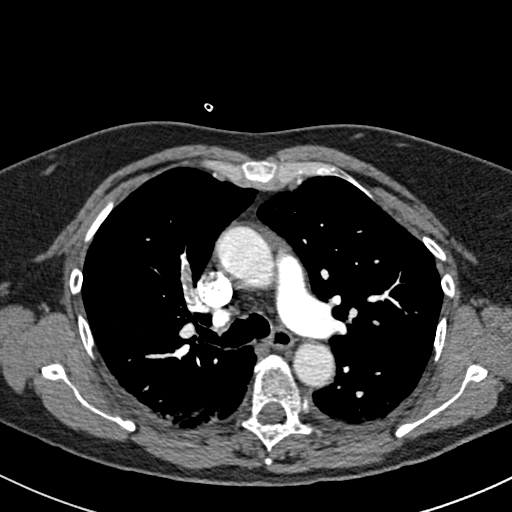
[im 149/223  lung]
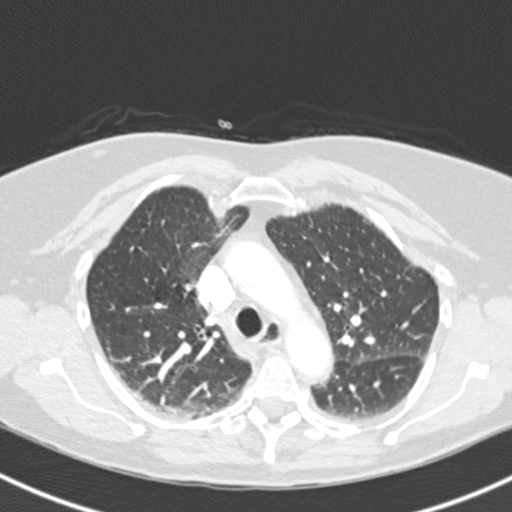
[im 173/223  soft-tissue]
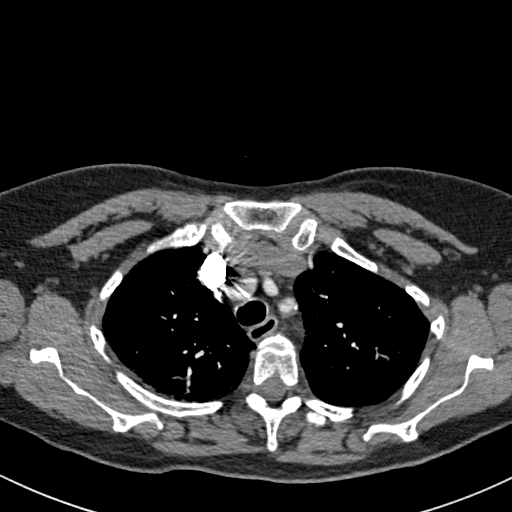
[im 186/223  lung]
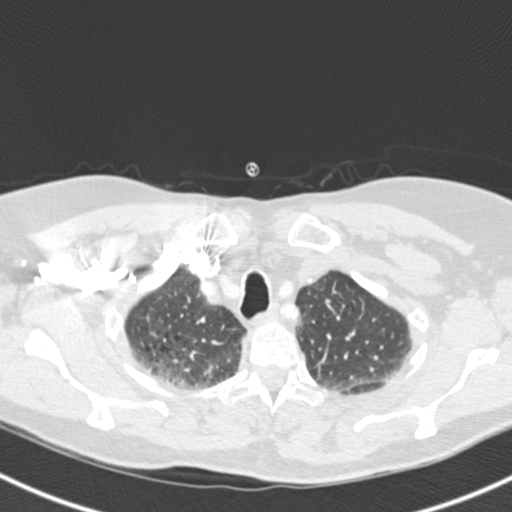
[im 198/223  soft-tissue]
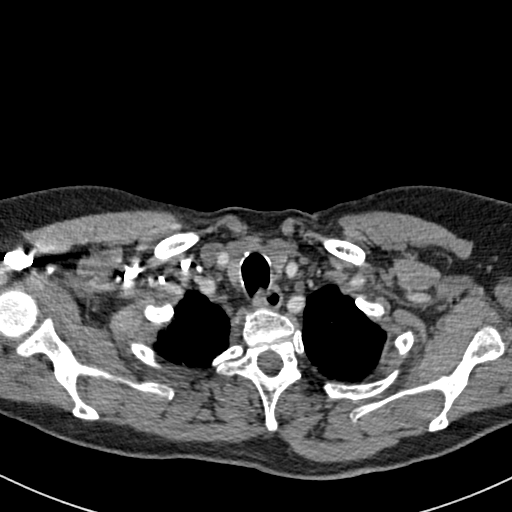
[im 210/223  lung]
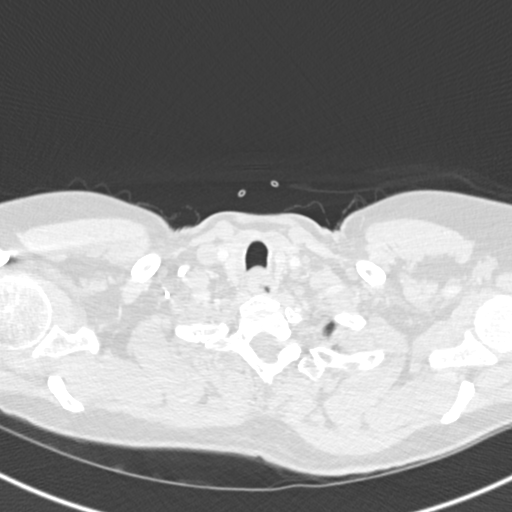

[Series 7: coronal mpr · coronal · 0.48mm/px · 3 of 114 slices shown]
[im 29/114  soft-tissue]
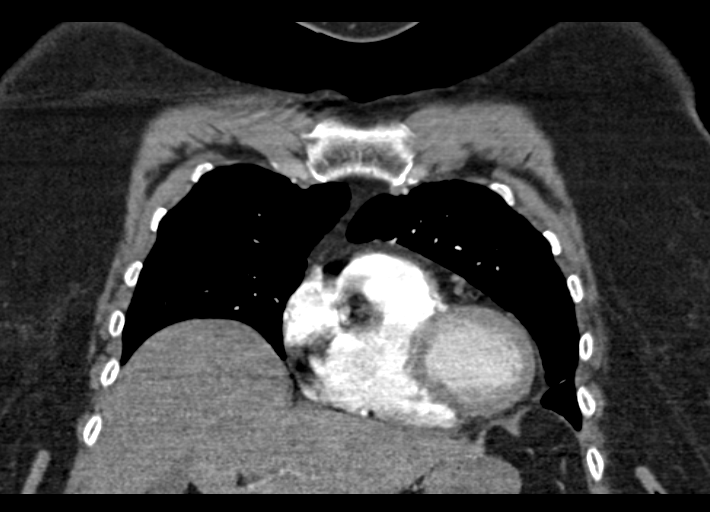
[im 57/114  soft-tissue]
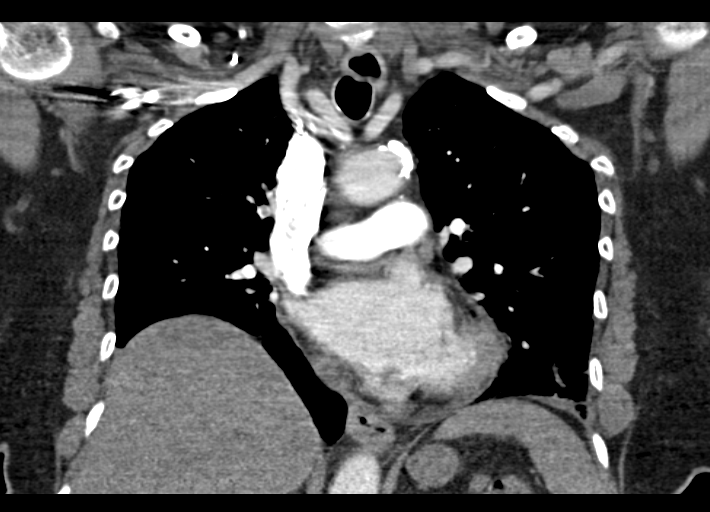
[im 85/114  soft-tissue]
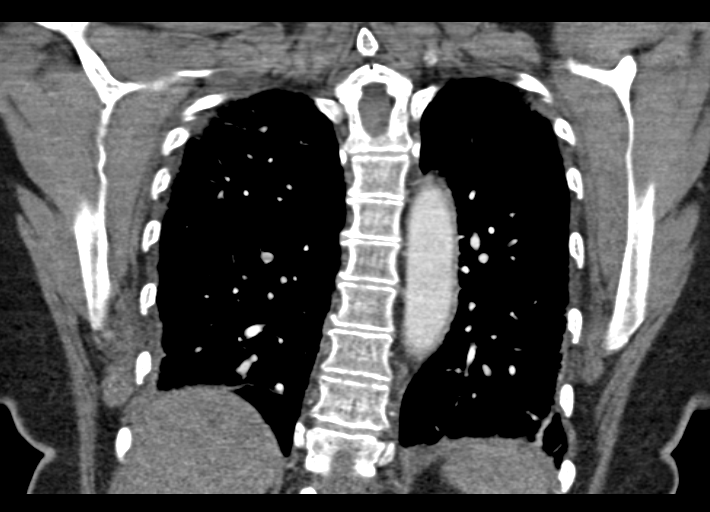

[18 of 46 positions shown; findings below may reference images not displayed]

FINDINGS: Pulmonary embolism is seen within the right upper, middle, and lower
lobar pulmonary arteries. No pulmonary emboli seen in the left
pulmonary arteries.

No evidence of thoracic aortic aneurysm or dissection. No evidence
of mediastinal mass or hematoma. No lymphadenopathy identified
within the thorax.

Dependent atelectasis is noted bilaterally however there is no
evidence of pulmonary consolidation or mass. Central
tracheobronchial airways are patent.

Review of the MIP images confirms the above findings.
IMPRESSION: Positive for pulmonary embolism in right upper, middle, and lower
lobar pulmonary arteries.

Mild dependent atelectasis in both lungs.

Critical Value/emergent results were called by telephone at the time
of interpretation on 06/12/2013 at [DATE] to Dr. TAT TIGER ,
who verbally acknowledged these results.

## 2015-07-25 IMAGING — CR DG CHEST 2V
2 series · 2 of 2 positions shown · non-contrast
Comparison: 05/27/2013; 08/19/2003

CLINICAL DATA: Evaluate for pulmonary embolism

EXAM:
CHEST  2 VIEW

[w chest lat]
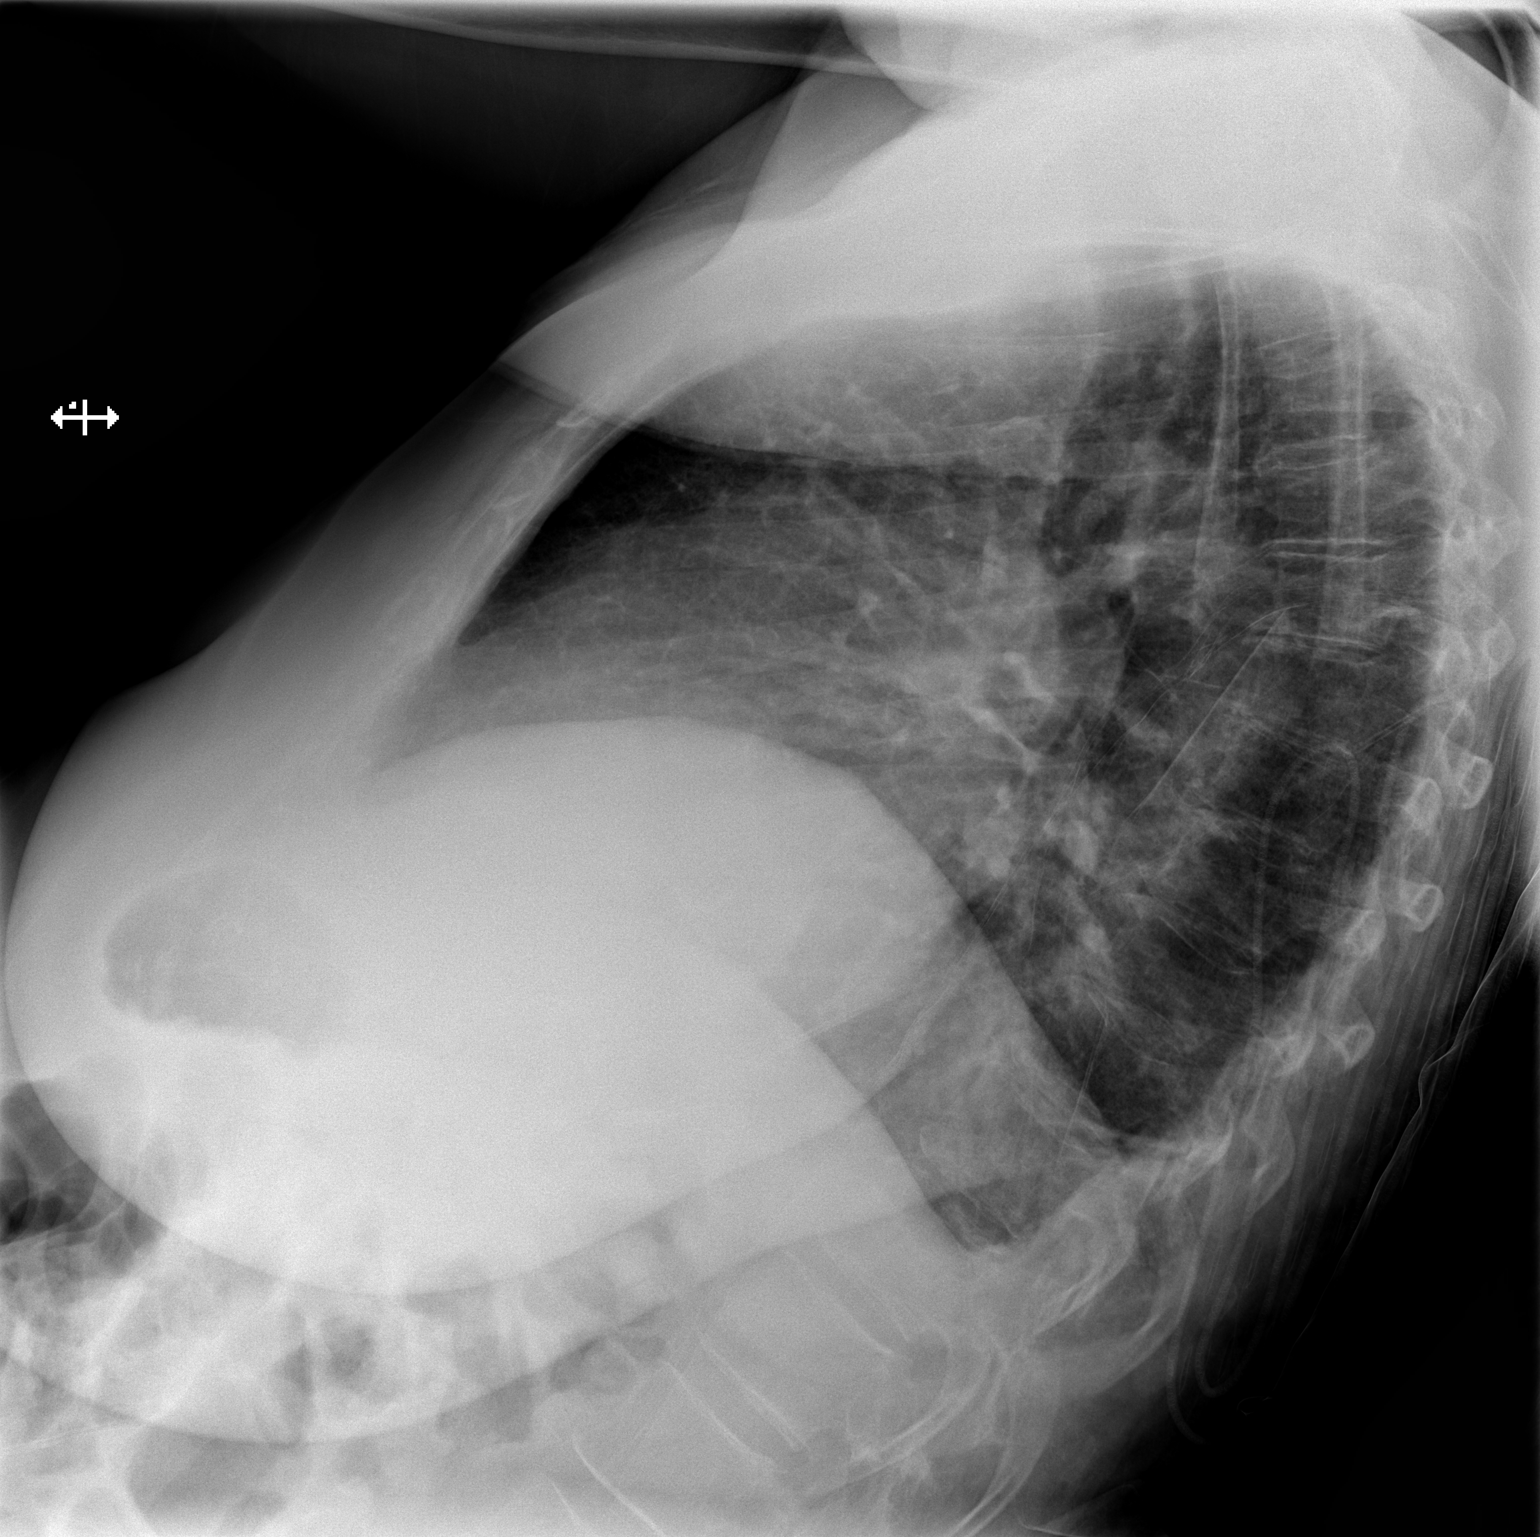

[x chest ap]
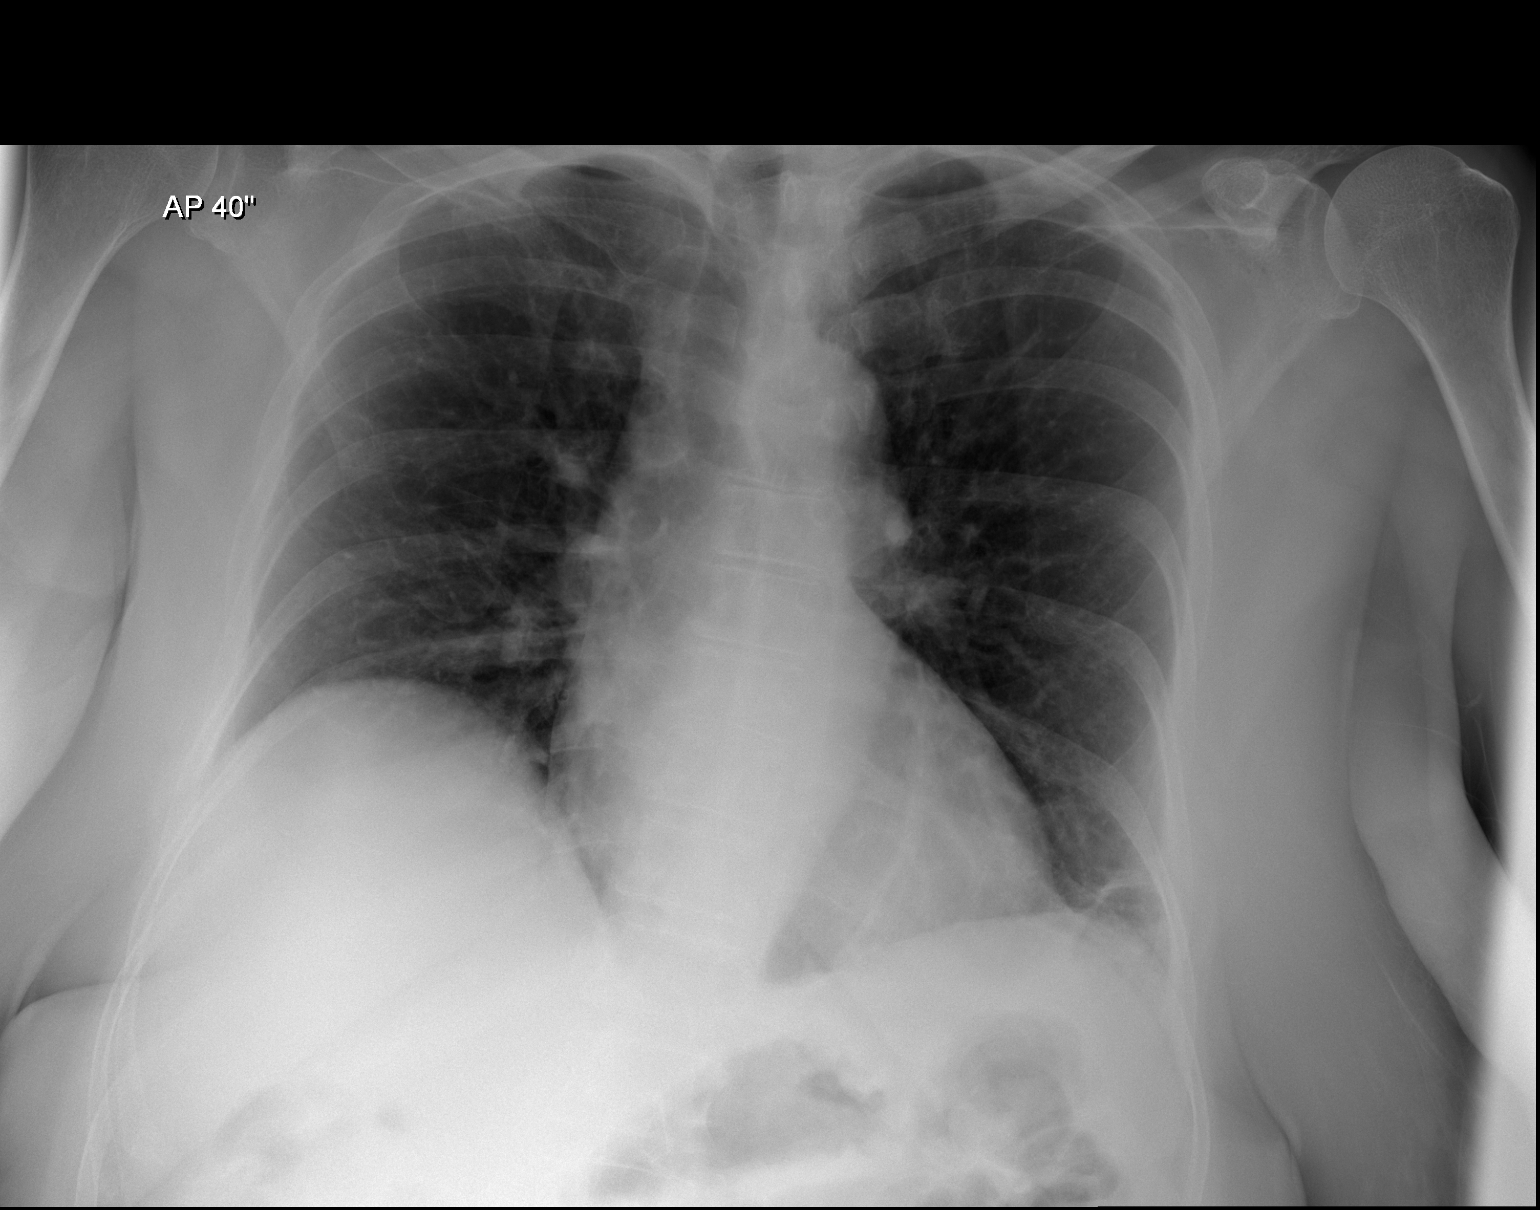

[2 of 2 positions shown; findings below may reference images not displayed]

FINDINGS: Grossly unchanged cardiac silhouette and mediastinal contours. Labs
are slightly reduced with mild elevation of the right hemidiaphragm
and slight worsening of bibasilar heterogeneous opacities, left
greater than right. Mild cephalization of flow without frank
evidence of edema. No definite pleural effusion. No pneumothorax.
Unchanged bones including mild scoliotic curvature of the
thoracolumbar spine.
IMPRESSION: Bibasilar atelectasis without acute cardiopulmonary disease on this
hypoventilated examination.

## 2015-07-25 IMAGING — CR DG ABDOMEN 1V
1 series · 1 of 1 positions shown · non-contrast
Comparison: Abdominal radiograph 06/11/2013.

CLINICAL DATA: Constipation.

EXAM:
ABDOMEN - 1 VIEW

[t abdomen supine]
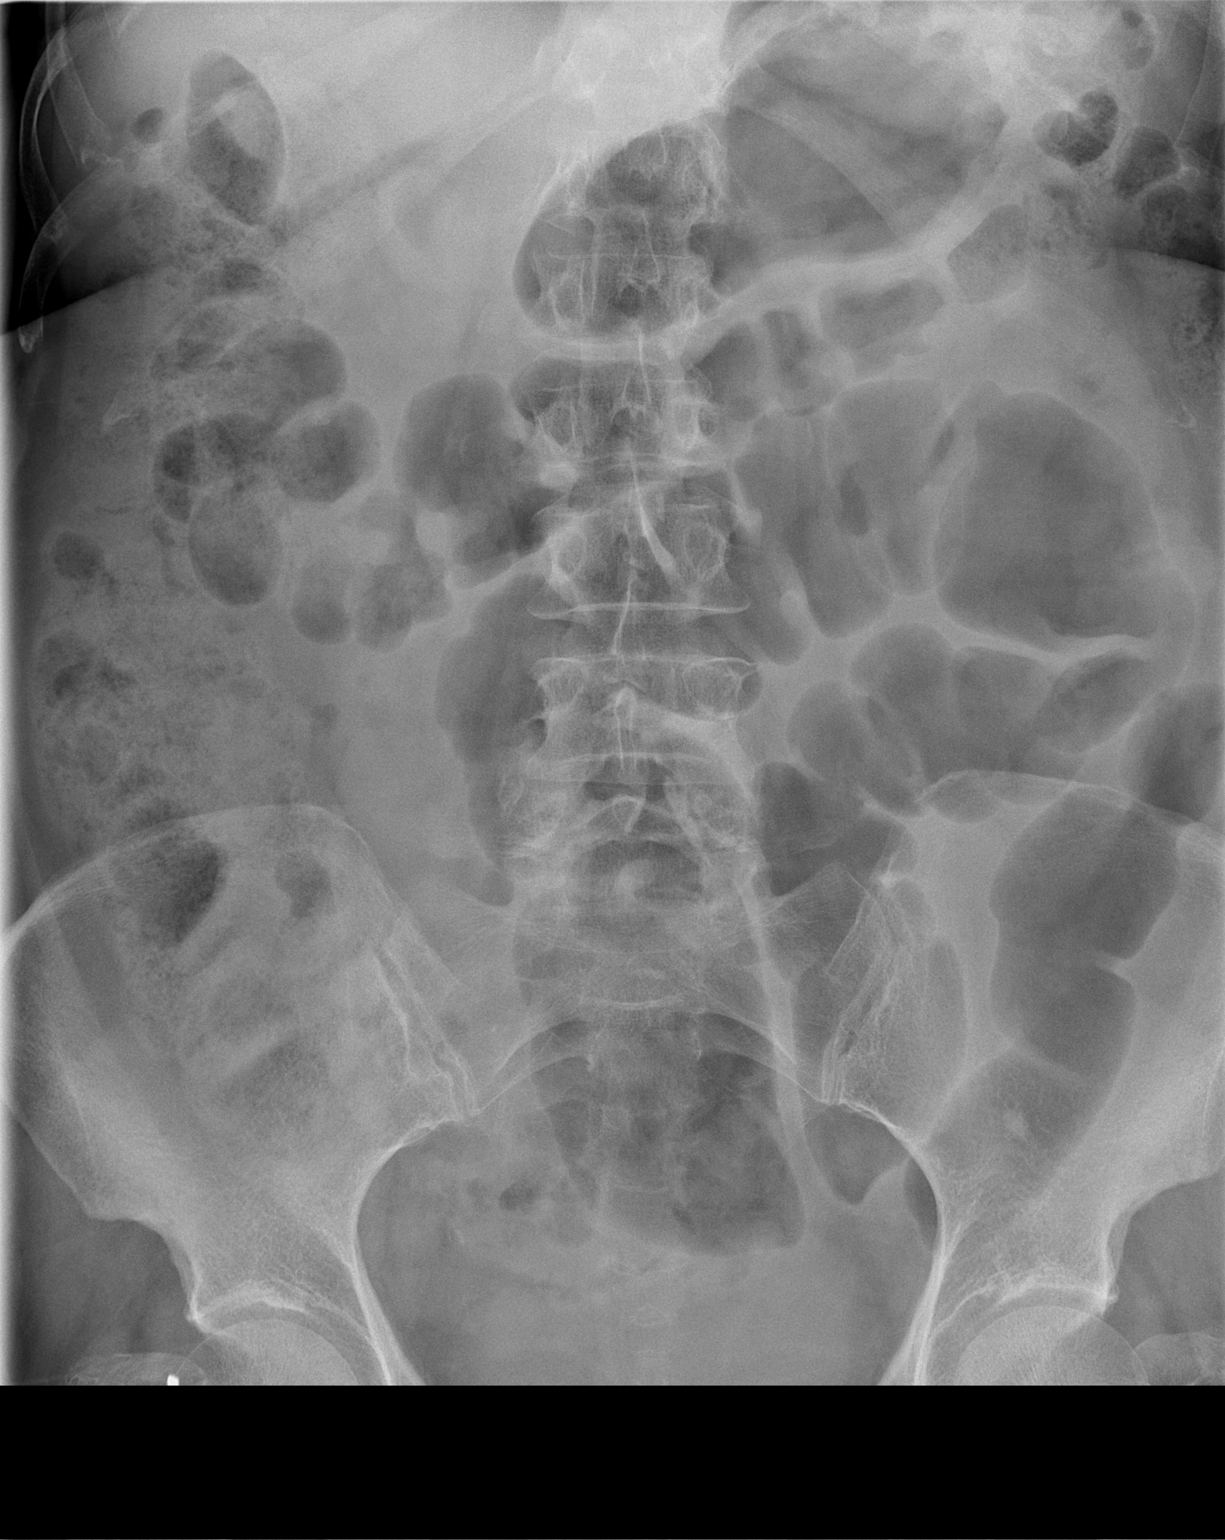

[1 of 1 positions shown; findings below may reference images not displayed]

FINDINGS: Stool is demonstrated within the cecum and ascending colon. Gas is
demonstrated throughout the colon. No dilated loops of small bowel
are identified. The upper abdomen and liver excluded from view,
limiting evaluation for free intraperitoneal air, lung base
pathology and portal venous gas. Lower lumbar spine degenerative
change.
IMPRESSION: Nonobstructed bowel gas pattern. Mild amount of stool and gas within
the colon.

## 2015-07-27 ENCOUNTER — Encounter (HOSPITAL_COMMUNITY): Payer: Self-pay

## 2015-07-27 ENCOUNTER — Encounter (HOSPITAL_COMMUNITY)
Admission: RE | Admit: 2015-07-27 | Discharge: 2015-07-27 | Disposition: A | Discharge status: Home / Self Care | Payer: 59 | Source: Ambulatory Visit | Attending: Plastic Surgery | Admitting: Plastic Surgery

## 2015-07-27 DIAGNOSIS — Z01812 Encounter for preprocedural laboratory examination: Secondary | ICD-10-CM | POA: Insufficient documentation

## 2015-07-27 HISTORY — DX: Unspecified hearing loss, unspecified ear: H91.90

## 2015-07-27 HISTORY — DX: Other specified postprocedural states: R11.2

## 2015-07-27 HISTORY — DX: Other specified postprocedural states: Z98.890

## 2015-07-27 HISTORY — DX: Stress incontinence (female) (male): N39.3

## 2015-07-27 HISTORY — DX: Presence of spectacles and contact lenses: Z97.3

## 2015-07-27 LAB — CBC
HCT: 38.1 % (ref 36.0–46.0)
Hemoglobin: 12.1 g/dL (ref 12.0–15.0)
MCH: 31.2 pg (ref 26.0–34.0)
MCHC: 31.8 g/dL (ref 30.0–36.0)
MCV: 98.2 fL (ref 78.0–100.0)
PLATELETS: 289 10*3/uL (ref 150–400)
RBC: 3.88 MIL/uL (ref 3.87–5.11)
RDW: 14.1 % (ref 11.5–15.5)
WBC: 7.6 10*3/uL (ref 4.0–10.5)

## 2015-07-27 LAB — BASIC METABOLIC PANEL
Anion gap: 5 (ref 5–15)
BUN: 14 mg/dL (ref 6–20)
CHLORIDE: 109 mmol/L (ref 101–111)
CO2: 26 mmol/L (ref 22–32)
CREATININE: 0.76 mg/dL (ref 0.44–1.00)
Calcium: 9.4 mg/dL (ref 8.9–10.3)
GFR calc non Af Amer: >60 mL/min (ref >60)
Glucose, Bld: 90 mg/dL (ref 65–99)
POTASSIUM: 3.9 mmol/L (ref 3.5–5.1)
SODIUM: 140 mmol/L (ref 135–145)

## 2015-07-27 NOTE — Progress Notes (Signed)
PCP - Dr. Rachell Cipro Cardiologist - denies  EKG - 07/27/15 CXR - denies  Echo/Stress test - > 10 years Cardiac Cath - denies  Patient denies chest pain and shortness of breath at PAT appointment.

## 2015-07-27 NOTE — Pre-Procedure Instructions (Signed)
Sharon Daniels  07/27/2015      [place], [place] [address] Phone: [phone] Fax: [place], [place] - [address] [facility] [address] [place] [address] Phone: [phone] Fax: [phone]    Your procedure is scheduled on Thursday, July 27th, 2017.  Report to [facility] Admitting at 5:30 A.M.   Call this number if you have problems the morning of surgery:  [phone]   Remember:  Do not eat food or drink liquids after midnight.   Take these medicines the morning of surgery with A SIP OF WATER: Duloxetine (Cymbalta), Fluticasone (Flonase) if needed, Loratadine (Claritin), Montelukast (Singulair), Omeprazole (Prilosec).  7 days prior to surgery, stop taking: Diclofenac Sodium Gel (Voltaren), Aspirin, NSAIDS, Aleve, Naproxen, Ibuprofen, Advil, Motrin, BC's, Goody's, Fish oil, all herbal medications, and all vitamins.    Do not wear jewelry, make-up or nail polish.  Do not wear lotions, powders, or perfumes.  You may NOT wear deoderant.  Do not shave 48 hours prior to surgery.    Do not bring valuables to the hospital.  [facility] is not responsible for any belongings or valuables.  Contacts, dentures or bridgework may not be worn into surgery.  Leave your suitcase in the car.  After surgery it may be brought to your room.  For patients admitted to the hospital, discharge time will be determined by your treatment team.  Patients discharged the day of surgery will not be allowed to drive home.   Special instructions:  Preparing for Surgery  Please read over the following fact sheets that you were given.   [place]- Preparing For Surgery  Before surgery, you can play an important role. Because skin is not sterile, your skin needs to be as free of germs as possible. You can reduce the number of germs  on your skin by washing with CHG (chlorahexidine gluconate) Soap before surgery.  CHG is an antiseptic cleaner which kills germs and bonds with the skin to continue killing germs even after washing.  Please do not use if you have an allergy to CHG or antibacterial soaps. If your skin becomes reddened/irritated stop using the CHG.  Do not shave (including legs and underarms) for at least 48 hours prior to first CHG shower. It is OK to shave your face.  Please follow these instructions carefully.   1. Shower the NIGHT BEFORE SURGERY and the MORNING OF SURGERY with CHG.   2. If you chose to wash your hair, wash your hair first as usual with your normal shampoo.  3. After you shampoo, rinse your hair and body thoroughly to remove the shampoo.  4. Use CHG as you would any other liquid soap. You can apply CHG directly to the skin and wash gently with a scrungie or a clean washcloth.   5. Apply the CHG Soap to your body ONLY FROM THE NECK DOWN.  Do not use on open wounds or open sores. Avoid contact with your eyes, ears, mouth and genitals (private parts). Wash genitals (private parts) with your normal soap.  6. Wash thoroughly, paying special attention to the area where your surgery will be performed.  7. Thoroughly rinse your body with warm water from the neck down.  8. DO NOT shower/wash with your normal soap after using and rinsing off the CHG Soap.  9. Pat yourself dry  with a CLEAN TOWEL.   10. Wear CLEAN PAJAMAS   11. Place CLEAN SHEETS on your bed the night of your first shower and DO NOT SLEEP WITH PETS.  Day of Surgery: Do not apply any deodorants/lotions. Please wear clean clothes to the hospital/surgery center.

## 2015-07-28 MED FILL — VALSARTAN 80 MG TABLET: 80 | 90 days supply | Qty: 90 | Fill #1

## 2015-07-28 MED FILL — DULoxetine HCL 60 MG CPEP: 60 | 90 days supply | Qty: 90 | Fill #1

## 2015-07-29 ENCOUNTER — Other Ambulatory Visit (HOSPITAL_COMMUNITY): Payer: Self-pay | Admitting: Plastic Surgery

## 2015-07-29 DIAGNOSIS — N62 Hypertrophy of breast: Secondary | ICD-10-CM | POA: Diagnosis not present

## 2015-07-29 MED FILL — HYDROmorphone HCL 2 MG TABS: 2 | 5 days supply | Qty: 30 | Fill #0

## 2015-07-29 MED FILL — METHOCARBAMOL 500 MG TABLET: 500 | 10 days supply | Qty: 40 | Fill #0

## 2015-07-29 MED FILL — ENOXAPARIN 40 MG/0.4 ML SYR: 40 | 14 days supply | Qty: 6 | Fill #0

## 2015-08-03 MED ORDER — CEFAZOLIN SODIUM-DEXTROSE 2-4 GM/100ML-% IV SOLN
2.0000 g | INTRAVENOUS | Status: AC
  Administered 2015-08-04: 2 g via INTRAVENOUS
  Filled 2015-08-03: qty 100

## 2015-08-03 MED ORDER — HEPARIN SODIUM (PORCINE) 5000 UNIT/ML IJ SOLN
5000.0000 [IU] | INTRAMUSCULAR | Status: AC
  Administered 2015-08-04: 5000 [IU] via SUBCUTANEOUS
  Filled 2015-08-03: qty 1

## 2015-08-03 NOTE — Anesthesia Preprocedure Evaluation (Signed)
Anesthesia Evaluation  Patient identified by MRN, date of birth, ID band Patient awake    Reviewed: Allergy & Precautions, NPO status , Patient's Chart, lab work & pertinent test results  History of Anesthesia Complications (+) PONV  Airway Mallampati: II  TM Distance: >3 FB Neck ROM: Full    Dental  (+) Dental Advisory Given   Pulmonary neg pulmonary ROS, former smoker,    breath sounds clear to auscultation       Cardiovascular hypertension, Pt. on medications + Peripheral Vascular Disease   Rhythm:Regular Rate:Normal     Neuro/Psych Depression negative neurological ROS     GI/Hepatic negative GI ROS, Neg liver ROS,   Endo/Other  negative endocrine ROS  Renal/GU negative Renal ROS     Musculoskeletal   Abdominal   Peds  Hematology negative hematology ROS (+)   Anesthesia Other Findings   Reproductive/Obstetrics                            Lab Results  Component Value Date   WBC 7.6 07/27/2015   HGB 12.1 07/27/2015   HCT 38.1 07/27/2015   MCV 98.2 07/27/2015   PLT 289 07/27/2015   Lab Results  Component Value Date   CREATININE 0.76 07/27/2015   BUN 14 07/27/2015   NA 140 07/27/2015   K 3.9 07/27/2015   CL 109 07/27/2015   CO2 26 07/27/2015    Anesthesia Physical Anesthesia Plan  ASA: II  Anesthesia Plan: General   Post-op Pain Management:    Induction: Intravenous  Airway Management Planned: Oral ETT  Additional Equipment:   Intra-op Plan:   Post-operative Plan: Extubation in OR  Informed Consent: I have reviewed the patients History and Physical, chart, labs and discussed the procedure including the risks, benefits and alternatives for the proposed anesthesia with the patient or authorized representative who has indicated his/her understanding and acceptance.   Dental advisory given  Plan Discussed with: CRNA  Anesthesia Plan Comments:         Anesthesia Quick Evaluation

## 2015-08-04 ENCOUNTER — Encounter (HOSPITAL_COMMUNITY)
Admission: RE | Disposition: A | Discharge status: Home / Self Care | Payer: Self-pay | Source: Ambulatory Visit | Attending: Plastic Surgery

## 2015-08-04 ENCOUNTER — Ambulatory Visit (HOSPITAL_COMMUNITY): Payer: 59 | Admitting: Anesthesiology

## 2015-08-04 ENCOUNTER — Encounter (HOSPITAL_COMMUNITY): Payer: Self-pay | Admitting: Surgery

## 2015-08-04 ENCOUNTER — Ambulatory Visit (HOSPITAL_COMMUNITY)
Admission: RE | Admit: 2015-08-04 | Discharge: 2015-08-05 | Disposition: A | Discharge status: Home / Self Care | Payer: 59 | Source: Ambulatory Visit | Attending: Plastic Surgery | Admitting: Plastic Surgery

## 2015-08-04 DIAGNOSIS — I739 Peripheral vascular disease, unspecified: Secondary | ICD-10-CM | POA: Insufficient documentation

## 2015-08-04 DIAGNOSIS — N6022 Fibroadenosis of left breast: Secondary | ICD-10-CM | POA: Diagnosis not present

## 2015-08-04 DIAGNOSIS — N6021 Fibroadenosis of right breast: Secondary | ICD-10-CM | POA: Insufficient documentation

## 2015-08-04 DIAGNOSIS — N6012 Diffuse cystic mastopathy of left breast: Secondary | ICD-10-CM | POA: Insufficient documentation

## 2015-08-04 DIAGNOSIS — I10 Essential (primary) hypertension: Secondary | ICD-10-CM | POA: Insufficient documentation

## 2015-08-04 DIAGNOSIS — F329 Major depressive disorder, single episode, unspecified: Secondary | ICD-10-CM | POA: Insufficient documentation

## 2015-08-04 DIAGNOSIS — Z87891 Personal history of nicotine dependence: Secondary | ICD-10-CM | POA: Insufficient documentation

## 2015-08-04 DIAGNOSIS — Z885 Allergy status to narcotic agent status: Secondary | ICD-10-CM | POA: Insufficient documentation

## 2015-08-04 DIAGNOSIS — M1990 Unspecified osteoarthritis, unspecified site: Secondary | ICD-10-CM | POA: Diagnosis not present

## 2015-08-04 DIAGNOSIS — N6011 Diffuse cystic mastopathy of right breast: Secondary | ICD-10-CM | POA: Diagnosis not present

## 2015-08-04 DIAGNOSIS — N62 Hypertrophy of breast: Secondary | ICD-10-CM | POA: Diagnosis present

## 2015-08-04 DIAGNOSIS — D649 Anemia, unspecified: Secondary | ICD-10-CM | POA: Diagnosis not present

## 2015-08-04 HISTORY — PX: BREAST REDUCTION SURGERY: SHX8

## 2015-08-04 LAB — GLUCOSE, CAPILLARY: Glucose-Capillary: 110 mg/dL — ABNORMAL HIGH (ref 65–99)

## 2015-08-04 SURGERY — MAMMOPLASTY, REDUCTION
Anesthesia: General | Laterality: Bilateral

## 2015-08-04 MED ORDER — IRBESARTAN 75 MG PO TABS
75.0000 mg | ORAL_TABLET | Freq: Every day | ORAL | Status: DC
Start: 2015-08-04 — End: 2015-08-05
  Administered 2015-08-04 – 2015-08-05 (×2): 75 mg via ORAL
  Filled 2015-08-04 (×2): qty 1

## 2015-08-04 MED ORDER — CEFAZOLIN SODIUM-DEXTROSE 2-4 GM/100ML-% IV SOLN
2.0000 g | Freq: Three times a day (TID) | INTRAVENOUS | Status: DC
  Administered 2015-08-04 – 2015-08-05 (×3): 2 g via INTRAVENOUS
  Filled 2015-08-04 (×7): qty 100

## 2015-08-04 MED ORDER — HYDROMORPHONE HCL 1 MG/ML IJ SOLN
INTRAMUSCULAR | Status: AC
  Filled 2015-08-04: qty 1

## 2015-08-04 MED ORDER — ONDANSETRON HCL 4 MG/2ML IJ SOLN
INTRAMUSCULAR | Status: DC | PRN
  Administered 2015-08-04: 4 mg via INTRAVENOUS

## 2015-08-04 MED ORDER — PROMETHAZINE HCL 25 MG/ML IJ SOLN: 6.2500 mg | INTRAMUSCULAR | Status: DC | PRN

## 2015-08-04 MED ORDER — DEXAMETHASONE SODIUM PHOSPHATE 10 MG/ML IJ SOLN
INTRAMUSCULAR | Status: AC
  Filled 2015-08-04: qty 1

## 2015-08-04 MED ORDER — ACETAMINOPHEN 325 MG PO TABS: 650.0000 mg | ORAL_TABLET | Freq: Four times a day (QID) | ORAL | Status: DC | PRN

## 2015-08-04 MED ORDER — FENTANYL CITRATE (PF) 250 MCG/5ML IJ SOLN
INTRAMUSCULAR | Status: DC | PRN
  Administered 2015-08-04: 50 ug via INTRAVENOUS
  Administered 2015-08-04: 150 ug via INTRAVENOUS
  Administered 2015-08-04 (×4): 50 ug via INTRAVENOUS
  Administered 2015-08-04: 100 ug via INTRAVENOUS

## 2015-08-04 MED ORDER — POLYVINYL ALCOHOL 1.4 % OP SOLN
1.0000 [drp] | Freq: Four times a day (QID) | OPHTHALMIC | Status: DC | PRN
  Filled 2015-08-04: qty 15

## 2015-08-04 MED ORDER — CHLORHEXIDINE GLUCONATE CLOTH 2 % EX PADS: 6.0000 | MEDICATED_PAD | Freq: Once | CUTANEOUS | Status: DC

## 2015-08-04 MED ORDER — EPHEDRINE SULFATE 50 MG/ML IJ SOLN
INTRAMUSCULAR | Status: DC | PRN
  Administered 2015-08-04 (×3): 10 mg via INTRAVENOUS

## 2015-08-04 MED ORDER — MIDAZOLAM HCL 2 MG/2ML IJ SOLN
INTRAMUSCULAR | Status: AC
  Filled 2015-08-04: qty 2

## 2015-08-04 MED ORDER — ROCURONIUM BROMIDE 50 MG/5ML IV SOLN
INTRAVENOUS | Status: AC
  Filled 2015-08-04: qty 1

## 2015-08-04 MED ORDER — PANTOPRAZOLE SODIUM 40 MG PO TBEC
80.0000 mg | DELAYED_RELEASE_TABLET | Freq: Every day | ORAL | Status: DC
  Administered 2015-08-04 – 2015-08-05 (×2): 80 mg via ORAL
  Filled 2015-08-04 (×2): qty 2

## 2015-08-04 MED ORDER — ARTIFICIAL TEARS OP OINT
TOPICAL_OINTMENT | OPHTHALMIC | Status: DC | PRN
  Administered 2015-08-04: 1 via OPHTHALMIC

## 2015-08-04 MED ORDER — SUGAMMADEX SODIUM 200 MG/2ML IV SOLN
INTRAVENOUS | Status: DC | PRN
  Administered 2015-08-04: 183.2 mg via INTRAVENOUS

## 2015-08-04 MED ORDER — PROPOFOL 10 MG/ML IV BOLUS
INTRAVENOUS | Status: AC
  Filled 2015-08-04: qty 40

## 2015-08-04 MED ORDER — DEXAMETHASONE SODIUM PHOSPHATE 10 MG/ML IJ SOLN
INTRAMUSCULAR | Status: DC | PRN
  Administered 2015-08-04: 5 mg via INTRAVENOUS

## 2015-08-04 MED ORDER — HYDROCODONE-ACETAMINOPHEN 7.5-325 MG PO TABS: 1.0000 | ORAL_TABLET | Freq: Once | ORAL | Status: DC | PRN

## 2015-08-04 MED ORDER — ROCURONIUM BROMIDE 100 MG/10ML IV SOLN
INTRAVENOUS | Status: DC | PRN
  Administered 2015-08-04: 10 mg via INTRAVENOUS
  Administered 2015-08-04: 20 mg via INTRAVENOUS
  Administered 2015-08-04: 50 mg via INTRAVENOUS

## 2015-08-04 MED ORDER — ARTIFICIAL TEARS OP OINT
TOPICAL_OINTMENT | OPHTHALMIC | Status: AC
  Filled 2015-08-04: qty 3.5

## 2015-08-04 MED ORDER — METHOCARBAMOL 500 MG PO TABS: 500.0000 mg | ORAL_TABLET | Freq: Four times a day (QID) | ORAL | Status: DC | PRN

## 2015-08-04 MED ORDER — PROPOFOL 10 MG/ML IV BOLUS
INTRAVENOUS | Status: DC | PRN
  Administered 2015-08-04: 160 mg via INTRAVENOUS

## 2015-08-04 MED ORDER — MIDAZOLAM HCL 5 MG/5ML IJ SOLN
INTRAMUSCULAR | Status: DC | PRN
  Administered 2015-08-04: 2 mg via INTRAVENOUS

## 2015-08-04 MED ORDER — MONTELUKAST SODIUM 10 MG PO TABS
10.0000 mg | ORAL_TABLET | Freq: Every day | ORAL | Status: DC
  Administered 2015-08-04: 10 mg via ORAL
  Filled 2015-08-04 (×2): qty 1

## 2015-08-04 MED ORDER — HYDROMORPHONE HCL 2 MG PO TABS
2.0000 mg | ORAL_TABLET | ORAL | Status: DC | PRN
  Administered 2015-08-04: 4 mg via ORAL
  Administered 2015-08-05: 2 mg via ORAL
  Administered 2015-08-05: 4 mg via ORAL
  Filled 2015-08-04: qty 2
  Filled 2015-08-04: qty 1
  Filled 2015-08-04: qty 2

## 2015-08-04 MED ORDER — FENTANYL CITRATE (PF) 250 MCG/5ML IJ SOLN
INTRAMUSCULAR | Status: AC
  Filled 2015-08-04: qty 5

## 2015-08-04 MED ORDER — PHENYLEPHRINE HCL 10 MG/ML IJ SOLN
INTRAVENOUS | Status: DC | PRN
  Administered 2015-08-04: 10 ug/min via INTRAVENOUS

## 2015-08-04 MED ORDER — KETOROLAC TROMETHAMINE 30 MG/ML IJ SOLN
30.0000 mg | Freq: Once | INTRAMUSCULAR | Status: DC | PRN
  Administered 2015-08-04: 30 mg via INTRAVENOUS

## 2015-08-04 MED ORDER — DEXTROSE-NACL 5-0.45 % IV SOLN
INTRAVENOUS | Status: DC
  Administered 2015-08-04: 22:00:00 via INTRAVENOUS

## 2015-08-04 MED ORDER — DOCUSATE SODIUM 100 MG PO CAPS
100.0000 mg | ORAL_CAPSULE | Freq: Every day | ORAL | Status: DC
  Administered 2015-08-04 – 2015-08-05 (×2): 100 mg via ORAL
  Filled 2015-08-04 (×2): qty 1

## 2015-08-04 MED ORDER — LACTATED RINGERS IV SOLN
INTRAVENOUS | Status: DC | PRN
Start: 2015-08-04 — End: 2015-08-04
  Administered 2015-08-04 (×3): via INTRAVENOUS

## 2015-08-04 MED ORDER — PROPYLENE GLYCOL 0.6 % OP SOLN: 1.0000 [drp] | OPHTHALMIC | Status: DC | PRN

## 2015-08-04 MED ORDER — KETOROLAC TROMETHAMINE 30 MG/ML IJ SOLN
INTRAMUSCULAR | Status: AC
  Filled 2015-08-04: qty 1

## 2015-08-04 MED ORDER — LIDOCAINE HCL (CARDIAC) 20 MG/ML IV SOLN
INTRAVENOUS | Status: DC | PRN
  Administered 2015-08-04: 60 mg via INTRAVENOUS

## 2015-08-04 MED ORDER — 0.9 % SODIUM CHLORIDE (POUR BTL) OPTIME
TOPICAL | Status: DC | PRN
  Administered 2015-08-04 (×2): 1000 mL

## 2015-08-04 MED ORDER — DULOXETINE HCL 60 MG PO CPEP
60.0000 mg | ORAL_CAPSULE | Freq: Every day | ORAL | Status: DC
  Administered 2015-08-05: 60 mg via ORAL
  Filled 2015-08-04: qty 1

## 2015-08-04 MED ORDER — ONDANSETRON HCL 4 MG/2ML IJ SOLN
INTRAMUSCULAR | Status: AC
  Filled 2015-08-04: qty 2

## 2015-08-04 MED ORDER — HYDROMORPHONE HCL 1 MG/ML IJ SOLN
0.2500 mg | INTRAMUSCULAR | Status: DC | PRN
  Administered 2015-08-04 (×2): 0.5 mg via INTRAVENOUS

## 2015-08-04 SURGICAL SUPPLY — 52 items
ADH SKN CLS APL DERMABOND .7 (GAUZE/BANDAGES/DRESSINGS) ×2
ATCH SMKEVC FLXB CAUT HNDSWH (FILTER) ×1 IMPLANT
BALL CTTN LRG ABS STRL LF (GAUZE/BANDAGES/DRESSINGS)
BANDAGE ACE 6X5 VEL STRL LF (GAUZE/BANDAGES/DRESSINGS) ×2 IMPLANT
BANDAGE ELASTIC 6 VELCRO ST LF (GAUZE/BANDAGES/DRESSINGS) ×1 IMPLANT
BINDER BREAST LRG (GAUZE/BANDAGES/DRESSINGS) IMPLANT
BINDER BREAST XLRG (GAUZE/BANDAGES/DRESSINGS) ×1 IMPLANT
CANISTER SUCTION 2500CC (MISCELLANEOUS) ×2 IMPLANT
CHLORAPREP W/TINT 26ML (MISCELLANEOUS) ×3 IMPLANT
COTTONBALL LRG STERILE PKG (GAUZE/BANDAGES/DRESSINGS) IMPLANT
COVER SURGICAL LIGHT HANDLE (MISCELLANEOUS) ×2 IMPLANT
DERMABOND ADVANCED (GAUZE/BANDAGES/DRESSINGS) ×2
DERMABOND ADVANCED .7 DNX12 (GAUZE/BANDAGES/DRESSINGS) ×1 IMPLANT
DRAPE ORTHO SPLIT 77X108 STRL (DRAPES) ×4
DRAPE PROXIMA HALF (DRAPES) ×4 IMPLANT
DRAPE SURG ORHT 6 SPLT 77X108 (DRAPES) ×2 IMPLANT
DRAPE WARM FLUID 44X44 (DRAPE) ×2 IMPLANT
DRSG PAD ABDOMINAL 8X10 ST (GAUZE/BANDAGES/DRESSINGS) ×7 IMPLANT
ELECT CAUTERY BLADE 6.4 (BLADE) ×2 IMPLANT
ELECT REM PT RETURN 9FT ADLT (ELECTROSURGICAL) ×2
ELECTRODE REM PT RTRN 9FT ADLT (ELECTROSURGICAL) ×1 IMPLANT
EVACUATOR SMOKE ACCUVAC VALLEY (FILTER) ×1
GAUZE SPONGE 4X4 12PLY STRL (GAUZE/BANDAGES/DRESSINGS) ×4 IMPLANT
GLOVE BIO SURGEON STRL SZ7.5 (GLOVE) ×2 IMPLANT
GLOVE BIOGEL PI IND STRL 7.0 (GLOVE) IMPLANT
GLOVE BIOGEL PI IND STRL 8 (GLOVE) ×1 IMPLANT
GLOVE BIOGEL PI INDICATOR 7.0 (GLOVE) ×2
GLOVE BIOGEL PI INDICATOR 8 (GLOVE) ×1
GLOVE ECLIPSE 7.5 STRL STRAW (GLOVE) ×1 IMPLANT
GLOVE SURG SS PI 7.0 STRL IVOR (GLOVE) ×2 IMPLANT
GOWN STRL REUS W/ TWL LRG LVL3 (GOWN DISPOSABLE) ×1 IMPLANT
GOWN STRL REUS W/ TWL XL LVL3 (GOWN DISPOSABLE) ×1 IMPLANT
GOWN STRL REUS W/TWL LRG LVL3 (GOWN DISPOSABLE) ×2
GOWN STRL REUS W/TWL XL LVL3 (GOWN DISPOSABLE) ×2
KIT BASIN OR (CUSTOM PROCEDURE TRAY) ×2 IMPLANT
KIT ROOM TURNOVER OR (KITS) ×2 IMPLANT
MARKER SKIN DUAL TIP RULER LAB (MISCELLANEOUS) ×2 IMPLANT
NS IRRIG 1000ML POUR BTL (IV SOLUTION) ×4 IMPLANT
PACK GENERAL/GYN (CUSTOM PROCEDURE TRAY) ×2 IMPLANT
PAD ARMBOARD 7.5X6 YLW CONV (MISCELLANEOUS) ×2 IMPLANT
PREFILTER EVAC NS 1 1/3-3/8IN (MISCELLANEOUS) ×2 IMPLANT
SPONGE LAP 18X18 X RAY DECT (DISPOSABLE) ×2 IMPLANT
SUT MNCRL AB 3-0 PS2 18 (SUTURE) ×9 IMPLANT
SUT MNCRL AB 4-0 PS2 18 (SUTURE) ×10 IMPLANT
SUT MON AB 2-0 CT1 36 (SUTURE) ×6 IMPLANT
SUT PROLENE 3 0 PS 2 (SUTURE) ×4 IMPLANT
SUT PROLENE 5 0 PS 2 (SUTURE) ×4 IMPLANT
SUT SILK 4 0 PS 2 (SUTURE) IMPLANT
SUT VIC AB 3-0 SH 18 (SUTURE) ×2 IMPLANT
TOWEL OR 17X24 6PK STRL BLUE (TOWEL DISPOSABLE) ×2 IMPLANT
TOWEL OR 17X26 10 PK STRL BLUE (TOWEL DISPOSABLE) ×3 IMPLANT
TUBE CONNECTING 12X1/4 (SUCTIONS) ×2 IMPLANT

## 2015-08-04 NOTE — H&P (Signed)
I have re-examined and re-evaluated the patient and there are no changes.  See office notes in paper chart for H&P. 

## 2015-08-04 NOTE — Brief Op Note (Signed)
08/04/2015  10:38 AM  PATIENT:  Sharon Daniels  65 y.o. female  PRE-OPERATIVE DIAGNOSIS:  BREAST HYPERTROPHY  POST-OPERATIVE DIAGNOSIS:  BREAST HYPERTROPHY  PROCEDURE:  Procedure(s): MAMMARY REDUCTION  (BREAST) (Bilateral)  SURGEON:  Surgeon(s) and Role:    * Crissie Reese, MD - Primary  PHYSICIAN ASSISTANT:   ASSISTANTS: Judyann Munson, RNFA   ANESTHESIA:   general  EBL:  Total I/O In: 1000 [I.V.:1000] Out: -   BLOOD ADMINISTERED:none  DRAINS: none   LOCAL MEDICATIONS USED:  NONE  SPECIMEN:  Source of Specimen:  bilateral breast tissue  DISPOSITION OF SPECIMEN:  PATHOLOGY  COUNTS:  YES  TOURNIQUET:  * No tourniquets in log *  DICTATION: .Other Dictation: Dictation Number [id number]  PLAN OF CARE: Admit for overnight observation  PATIENT DISPOSITION:  PACU - hemodynamically stable.   Delay start of Pharmacological VTE agent (>24hrs) due to surgical blood loss or risk of bleeding: no

## 2015-08-04 NOTE — Op Note (Signed)
Sharon Daniels, KORANDA NO.:  [id number]  MEDICAL RECORD NO.:  [id number]  LOCATION:  [id number]                        FACILITY:  [facility]  PHYSICIAN:  Crissie Reese, M.D.     DATE OF BIRTH:  [birth year]  DATE OF PROCEDURE:  08/04/2015 DATE OF DISCHARGE:                              OPERATIVE REPORT   PREOPERATIVE DIAGNOSIS:  Bilateral macromastia.  POSTOPERATIVE DIAGNOSIS:  Bilateral macromastia.  PROCEDURE PERFORMED:  Bilateral breast reduction.  SURGEON:  Crissie Reese, M.D.  ASSISTANT:  Judyann Munson, RNFA.  ANESTHESIA:  General.  ESTIMATED BLOOD LOSS:  80 mL.  CLINICAL NOTE:  This 65 year old woman complains of large breast with associated upper back pain, neck pain, shoulder pain, bra strap shoulder grooving.  She desired breast reduction.  The nature of the procedure, risks plus complications were discussed in detail.  These risks include, but not limited to, bleeding, infection, healing problems, scarring, loss of sensation, loss sensation in the nipples, fluid accumulations, anesthesia complications, DVT, PE, asymmetry, failure to relieve symptoms, contour deformities, chronic pain, and overall disappointment. She also understood there was possibility of tissue loss including loss of nipples and loss of skin as well as fatty tissue.  Having understood all this, she wished to proceed.  DESCRIPTION OF PROCEDURE:  The patient was marked in a full standing position in the holding area for the breast reduction.  She was taken to the operating room and placed supine.  After successful induction of general anesthesia, she was prepped with ChloraPrep and after waiting 3 minutes for drying, she was draped with sterile drapes.  An 8 cm wide inferior nipple-areolar pedicles were designed, and a 38 mm marker marked in the nipple-areolar complexes.  These incisions were made and inferior pedicles were de-epithelialized.  The pedicles were then isolated from  surrounding tissues beveling outward both medial and lateral in order to ensure much broader attachment of tissue at the level of chest wall and then at the level of skin.  This was done in order to preserve blood flow to the nipple complexes.  Resections were performed medial, central, and lateral; and a total of 645 g from the left side and 657 g resected from the right side.  Thorough irrigation with saline.  Meticulous hemostasis with electrocautery.  Excellent hemostasis having been ensured, the closures with 2-0 and 3-0 Monocryl interrupted inverted deep dermal sutures, running 3-0 Monocryl subcuticular suture.  A measurement was then taken 5 cm up from the inframammary crease and the 38 mm marker marked the site for the nipple- areolar complex.  Tissue resected, irrigation with saline, hemostasis with electrocautery, and the nipple complexes were brought through this opening and were inspected and found to have good color with bright red bleeding on the periphery consistent viability.  The nipple-areolar insetting with 3-0 and 2-0 Monocryl interrupted inverted deep dermal sutures and running 4-0 Monocryl subcuticular suture.  Dermabond dry sterile dressings, circumferential Ace wrap, and the breast vest were positioned; and she was transferred to the recovery room stable having tolerated procedure well.  DISPOSITION:  She will be observed overnight on the floor.     Crissie Reese, M.D.     DB/MEDQ  D:  08/04/2015  T:  08/04/2015  Job:  [id number]

## 2015-08-04 NOTE — Anesthesia Postprocedure Evaluation (Signed)
Anesthesia Post Note  Patient: Sharon Daniels  Procedure(s) Performed: Procedure(s) (LRB): MAMMARY REDUCTION  (BREAST) (Bilateral)  Patient location during evaluation: PACU Anesthesia Type: General Level of consciousness: awake and alert Pain management: pain level controlled Vital Signs Assessment: post-procedure vital signs reviewed and stable Respiratory status: spontaneous breathing, nonlabored ventilation, respiratory function stable and patient connected to nasal cannula oxygen Cardiovascular status: blood pressure returned to baseline and stable Postop Assessment: no signs of nausea or vomiting Anesthetic complications: no    Last Vitals:  Vitals:   08/04/15 1213 08/04/15 1226  BP:  122/68  Pulse: 88 90  Resp: 18 16  Temp:  36.4 C    Last Pain:  Vitals:   08/04/15 1301  TempSrc:   PainSc: Tyler Deis

## 2015-08-04 NOTE — Transfer of Care (Signed)
Immediate Anesthesia Transfer of Care Note  Patient: Sharon Daniels  Procedure(s) Performed: Procedure(s): MAMMARY REDUCTION  (BREAST) (Bilateral)  Patient Location: PACU  Anesthesia Type:General  Level of Consciousness: awake, alert  and oriented  Airway & Oxygen Therapy: Patient Spontanous Breathing and Patient connected to nasal cannula oxygen  Post-op Assessment: Report given to RN and Post -op Vital signs reviewed and stable  Post vital signs: Reviewed and stable  Last Vitals:  Vitals:   08/04/15 0643 08/04/15 1045  BP: 120/68   Pulse: 81   Resp: 20   Temp: 36.7 C 36.7 C    Last Pain:  Vitals:   08/04/15 1045  TempSrc:   PainSc: 0-No pain      Patients Stated Pain Goal: 3 ([id number])  Complications: No apparent anesthesia complications

## 2015-08-04 NOTE — Anesthesia Procedure Notes (Signed)
Procedure Name: Intubation Date/Time: 08/04/2015 7:40 AM Performed by: Myna Bright Pre-anesthesia Checklist: Patient identified, Emergency Drugs available, Suction available and Patient being monitored Patient Re-evaluated:Patient Re-evaluated prior to inductionOxygen Delivery Method: Circle System Utilized Preoxygenation: Pre-oxygenation with 100% oxygen Intubation Type: IV induction Ventilation: Mask ventilation without difficulty and Oral airway inserted - appropriate to patient size Laryngoscope Size: Mac and 3 Grade View: Grade II Tube type: Oral Tube size: 7.0 mm Number of attempts: 1 Airway Equipment and Method: Stylet and Oral airway Placement Confirmation: ETT inserted through vocal cords under direct vision,  positive ETCO2 and breath sounds checked- equal and bilateral Secured at: 22 cm Tube secured with: Tape Dental Injury: Teeth and Oropharynx as per pre-operative assessment

## 2015-08-04 NOTE — Progress Notes (Addendum)
Report received from [facility], PACU RN.Patient alert and oriented. VSS. Patient oriented to room. Patient complaining of minimal pain right now and ice packs provided. Report then given to Mendel Ryder, RN who will be patient's nurse. Pharmacy contacted to verify medications.  1320: Pharmacy contacted again to verify medications and spoke with [facility].

## 2015-08-05 ENCOUNTER — Encounter (HOSPITAL_COMMUNITY): Payer: Self-pay | Admitting: Plastic Surgery

## 2015-08-05 DIAGNOSIS — N6012 Diffuse cystic mastopathy of left breast: Secondary | ICD-10-CM | POA: Diagnosis not present

## 2015-08-05 DIAGNOSIS — N6021 Fibroadenosis of right breast: Secondary | ICD-10-CM | POA: Diagnosis not present

## 2015-08-05 DIAGNOSIS — F329 Major depressive disorder, single episode, unspecified: Secondary | ICD-10-CM | POA: Diagnosis not present

## 2015-08-05 DIAGNOSIS — N6022 Fibroadenosis of left breast: Secondary | ICD-10-CM | POA: Diagnosis not present

## 2015-08-05 DIAGNOSIS — I739 Peripheral vascular disease, unspecified: Secondary | ICD-10-CM | POA: Diagnosis not present

## 2015-08-05 DIAGNOSIS — Z87891 Personal history of nicotine dependence: Secondary | ICD-10-CM | POA: Diagnosis not present

## 2015-08-05 DIAGNOSIS — I10 Essential (primary) hypertension: Secondary | ICD-10-CM | POA: Diagnosis not present

## 2015-08-05 DIAGNOSIS — N62 Hypertrophy of breast: Secondary | ICD-10-CM | POA: Diagnosis not present

## 2015-08-05 DIAGNOSIS — N6011 Diffuse cystic mastopathy of right breast: Secondary | ICD-10-CM | POA: Diagnosis not present

## 2015-08-05 MED ORDER — ENOXAPARIN SODIUM 40 MG/0.4ML ~~LOC~~ SOLN
40.0000 mg | SUBCUTANEOUS | Status: DC
  Administered 2015-08-05: 40 mg via SUBCUTANEOUS
  Filled 2015-08-05: qty 0.4

## 2015-08-05 NOTE — Discharge Summary (Signed)
Physician Discharge Summary  Patient ID: Sharon Daniels MRN: [id number] DOB/AGE: 1950-11-16 65 y.o.  Admit date: 08/04/2015 Discharge date: 08/05/2015  Admission Diagnoses: Macromastia  Discharge Diagnoses: Same Active Problems:   Breast hypertrophy   Discharged Condition: good  Hospital Course: On the day of admission the patient was taken to surgery and had bilateral breast reduction. The patient tolerated the procedures well. Postoperatively, the nipple complexes maintained excellent color and capillary refill. The patient was ambulatory and tolerating diet on the first postoperative day. .  Treatments: antibiotics: Ancef, anticoagulation: LMW heparin and surgery: bilateral breast reduction  Discharge Exam: Blood pressure (!) 106/52, pulse 82, temperature 98.3 F (36.8 C), temperature source Oral, resp. rate 17, height 5' 6.5" (1.689 m), weight 201 lb 15.8 oz (91.6 kg), SpO2 99 %.  Operative sites: Chest soft bilateral. No evidence of bleeding or infection. No evidence of vascular compromise. Nipple complexes have good color and are viable.  Disposition: 06-Home-Health Care Svc     Medication List    STOP taking these medications   loratadine 10 MG tablet Commonly known as:  CLARITIN   multivitamin tablet   nystatin powder Generic drug:  nystatin   Vitamin E 100 units Tabs   VOLTAREN 1 % Gel Generic drug:  diclofenac sodium     TAKE these medications   DULoxetine 60 MG capsule Commonly known as:  CYMBALTA Take 60 mg by mouth daily.   estradiol 0.1 MG/GM vaginal cream Commonly known as:  ESTRACE Place 1 Applicatorful vaginally every other day.   fluticasone 50 MCG/ACT nasal spray Commonly known as:  FLONASE Place 1 spray into both nostrils daily as needed (for seasonal allergies). Reported on 04/21/2015   montelukast 10 MG tablet Commonly known as:  SINGULAIR Take 10 mg by mouth daily.   omeprazole 40 MG capsule Commonly known as:  PRILOSEC Take  40 mg by mouth daily as needed (for acid reflux or heartburn). Reported on 04/21/2015   SYSTANE BALANCE 0.6 % Soln Generic drug:  Propylene Glycol Apply 1 drop to eye as needed (Dry eyes). Reported on 04/21/2015   valsartan 80 MG tablet Commonly known as:  DIOVAN Take 80 mg by mouth every morning.   Vitamin D3 1000 units Caps Take 10,000 Units by mouth daily.        SignedHarlow Mares, Rome Schlauch M 08/05/2015, 8:30 AM

## 2015-08-05 NOTE — Progress Notes (Signed)
Went over discharge instructions with pt before discharge with pt voicing understanding the instructions. Condition stable for discharge

## 2015-08-05 NOTE — Discharge Instructions (Addendum)
No lifting for 6 weeks No vigorous activity for 6 weeks (including outdoor walks) No driving for 4 weeks OK to walk up stairs slowly Stay propped up Use incentive spirometer at home every hour while awake No shower Take an over-the-counter stool softener (such as Colace) while on pain medication For questions call [phone] or [phone] Continue to avoid all blood thinners including the over the counter things for 2 more weeks See Dr. Harlow Mares in office next week

## 2015-09-09 HISTORY — PX: ABDOMINOPLASTY: SUR9

## 2015-09-09 MED FILL — METAXALONE 800 MG TABLET: 800 | 7 days supply | Qty: 21 | Fill #0

## 2015-09-09 MED FILL — PROMETHAZINE 25 MG TABLET: 25 | 2 days supply | Qty: 10 | Fill #0

## 2015-09-09 MED FILL — HYDROCODON-APAP 7.5-325: 7.5-325 | 3 days supply | Qty: 15 | Fill #0

## 2015-09-09 MED FILL — ENOXAPARIN 40 MG/0.4 ML SYR: 40 | 10 days supply | Qty: 4 | Fill #0

## 2015-10-19 ENCOUNTER — Ambulatory Visit (INDEPENDENT_AMBULATORY_CARE_PROVIDER_SITE_OTHER): Payer: 59 | Admitting: Family Medicine

## 2015-10-19 ENCOUNTER — Ambulatory Visit (INDEPENDENT_AMBULATORY_CARE_PROVIDER_SITE_OTHER): Payer: 59

## 2015-10-19 VITALS — BP 124/76 | HR 100 | Temp 98.3°F | Resp 17 | Ht 66.5 in | Wt 196.0 lb

## 2015-10-19 DIAGNOSIS — J208 Acute bronchitis due to other specified organisms: Secondary | ICD-10-CM

## 2015-10-19 DIAGNOSIS — R0600 Dyspnea, unspecified: Secondary | ICD-10-CM

## 2015-10-19 DIAGNOSIS — R0789 Other chest pain: Secondary | ICD-10-CM

## 2015-10-19 DIAGNOSIS — I700 Atherosclerosis of aorta: Secondary | ICD-10-CM | POA: Diagnosis not present

## 2015-10-19 DIAGNOSIS — R05 Cough: Secondary | ICD-10-CM | POA: Diagnosis not present

## 2015-10-19 MED ORDER — IPRATROPIUM BROMIDE 0.02 % IN SOLN
0.5000 mg | Freq: Once | RESPIRATORY_TRACT | Status: AC
  Administered 2015-10-19: 0.5 mg via RESPIRATORY_TRACT

## 2015-10-19 MED ORDER — FLUCONAZOLE 150 MG PO TABS: 150.0000 mg | ORAL_TABLET | Freq: Once | ORAL | 0 refills | Status: AC

## 2015-10-19 MED ORDER — ALBUTEROL SULFATE (2.5 MG/3ML) 0.083% IN NEBU
2.5000 mg | INHALATION_SOLUTION | Freq: Once | RESPIRATORY_TRACT | Status: AC
  Administered 2015-10-19: 2.5 mg via RESPIRATORY_TRACT

## 2015-10-19 MED ORDER — BENZONATATE 100 MG PO CAPS: 100.0000 mg | ORAL_CAPSULE | Freq: Three times a day (TID) | ORAL | 0 refills | Status: DC | PRN

## 2015-10-19 MED ORDER — AMOXICILLIN-POT CLAVULANATE 875-125 MG PO TABS: 1.0000 | ORAL_TABLET | Freq: Two times a day (BID) | ORAL | 0 refills | Status: DC

## 2015-10-19 MED ORDER — ALBUTEROL SULFATE HFA 108 (90 BASE) MCG/ACT IN AERS: 2.0000 | INHALATION_SPRAY | RESPIRATORY_TRACT | 1 refills | Status: DC | PRN

## 2015-10-19 MED FILL — BENZONATATE 100 MG CAPSULE: 100 | 7 days supply | Qty: 40 | Fill #0

## 2015-10-19 MED FILL — FLUCONAZOLE 150 MG TABLET: 150 | 2 days supply | Qty: 2 | Fill #0

## 2015-10-19 MED FILL — AMOX-CLAV 875-125 MG TABLET: 875-125 | 10 days supply | Qty: 20 | Fill #0

## 2015-10-19 MED FILL — VENTOLIN HFA 90 MCG INHALER: 108 (90 BAS | 17 days supply | Qty: 18 | Fill #0

## 2015-10-19 NOTE — Patient Instructions (Addendum)
Start Augmentin take 1 pill twice daily for Acute Bronchitis.  Take Benzonatate  100-200 mg up to 3 times daily as needed for cough.  Albuterol 2 puffs as needed every 4-6 hours for shortness of breath or wheeze.  Aortic Atherosclerosis- Start taking aspirin 81 mg daily anti-platelet therapy. Referring to cardiology to obtain an echocardiogram.   IF you received an x-ray today, you will receive an invoice from [facility]. Please contact [facility] at [phone] with questions or concerns regarding your invoice.   IF you received labwork today, you will receive an invoice from [employer]. Please contact [facility] at [phone] with questions or concerns regarding your invoice.   Our billing staff will not be able to assist you with questions regarding bills from these companies.  You will be contacted with the lab results as soon as they are available. The fastest way to get your results is to activate your My Chart account. Instructions are located on the last page of this paperwork. If you have not heard from [place] regarding the results in 2 weeks, please contact this office.

## 2015-10-19 NOTE — Progress Notes (Signed)
Patient ID: Sharon Daniels, female    DOB: 08-May-1950, 65 y.o.   MRN: [id number]  PCP: Rachell Cipro, MD  Chief Complaint  Patient presents with  . Sinus Problem    Pain started yesterday, also c/o chest congestion since saturday  . Flu Vaccine    Subjective:   HPI  65 year old, female presents with 5 day history of cough, chest congestion, headache, and ear pain.  She developed facial pain in the maxillary sinus region yesterday. She has a history of chronic sinusitis in which she received surgery to decrease the frequency of reoccurrence.  She reports that she is recovering from "Tummy tuck" surgery performed on September 6th by Dr. Luvenia Heller and has been lying around more than more due to pain and fatigue. Currently has upper right chest wall tightness. Denies fever, reports sweats and chills. Cough is persistent and sometimes productive. Reports nasal drainage.   Social History   Social History  . Marital status: Legally Separated    Spouse name: N/A  . Number of children: N/A  . Years of education: N/A   Occupational History  . Social Worker    Social History Main Topics  . Smoking status: Former Smoker    Packs/day: 1.00    Years: 30.00    Types: Cigarettes    Quit date: 06/20/2015  . Smokeless tobacco: Never Used  . Alcohol use No     Comment: quit 01/25/1994   . Drug use: No     Comment: hx of cocaine, none 1982  . Sexual activity: Not Currently   Other Topics Concern  . Not on file   Social History Narrative  . No narrative on file   Family History  Problem Relation Age of Onset  . Kidney disease Mother   . Atrial fibrillation Mother   . Breast cancer Mother   . Ovarian cancer Mother   . Heart attack Father   . Colon cancer Neg Hx    Review of Systems See HPI  Patient Active Problem List   Diagnosis Date Noted  . Breast hypertrophy 08/04/2015  . Osteoporosis 05/12/2015  . History of colonic polyps 04/21/2015  . DVT, lower extremity, distal,  acute ([place]) 06/17/2013  . Ileus, postoperative ([place]) 06/17/2013  . 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 Hypokalemia 06/17/2013  . Acute pulmonary embolism ([place]) 06/13/2013  . Postoperative anemia due to acute blood loss 06/09/2013  . Transfusion history 06/09/2013  . OA (osteoarthritis) of knee 06/05/2013  . HIATAL HERNIA 06/18/2007  . FATTY LIVER DISEASE 06/18/2007     Prior to Admission medications   Medication Sig Start Date End Date Taking? Authorizing Provider  acetaminophen (TYLENOL) 500 MG tablet Take 500 mg by mouth every 6 (six) hours as needed.   Yes Historical Provider, MD  Chlorphen-Phenyleph-APAP (CORICIDIN D COLD/FLU/SINUS) 2-5-325 MG TABS Take by mouth.   Yes Historical Provider, MD  Cholecalciferol (VITAMIN D3) 1000 UNITS CAPS Take 10,000 Units by mouth daily.  10/29/11  Yes Historical Provider, MD  DULoxetine (CYMBALTA) 60 MG capsule Take 60 mg by mouth daily.   Yes Historical Provider, MD  estradiol (ESTRACE) 0.1 MG/GM vaginal cream Place 1 Applicatorful vaginally every other day.  01/08/12  Yes Historical Provider, MD  fluticasone (FLONASE) 50 MCG/ACT nasal spray Place 1 spray into both nostrils daily as needed (for seasonal allergies). Reported on 04/21/2015 01/16/13  Yes Historical Provider, MD  montelukast (SINGULAIR) 10 MG tablet Take 10 mg by mouth daily.   Yes Historical Provider,  MD  Propylene Glycol (SYSTANE BALANCE) 0.6 % SOLN Apply 1 drop to eye as needed (Dry eyes). Reported on 04/21/2015   Yes Historical Provider, MD  valsartan (DIOVAN) 80 MG tablet Take 80 mg by mouth every morning.    Yes Historical Provider, MD  omeprazole (PRILOSEC) 40 MG capsule Take 40 mg by mouth daily as needed (for acid reflux or heartburn). Reported on 04/21/2015    Historical Provider, MD    Allergies  Allergen Reactions  . Morphine Itching, Anxiety and Rash    IV only, made her mean      Objective:  Physical Exam  Constitutional: She is oriented to person, place, and  time. She appears well-developed and well-nourished.  Cardiovascular: Normal rate, regular rhythm, normal heart sounds and intact distal pulses.   Pulmonary/Chest: Effort normal. No respiratory distress. She exhibits tenderness.  Diminished air movement Chest tightness with deep breathing  Mild dyspnea on exam  Musculoskeletal: Normal range of motion.  Neurological: She is alert and oriented to person, place, and time.  Skin: Skin is warm.  Psychiatric: She has a normal mood and affect. Her behavior is normal. Judgment and thought content normal.    Vitals:   10/19/15 0828  BP: 124/76  Pulse: 100  Resp: 17  Temp: 98.3 F (36.8 C)   Dg Chest 2 View  Result Date: 10/19/2015 CLINICAL DATA:  Five-day history of cough, chest congestion, headache, and here pain. EXAM: CHEST  2 VIEW COMPARISON:  Chest x-ray and chest CT scan of June 12, 2013 FINDINGS: The lungs are adequately inflated. There is stable minimal scarring at the left lung base. There is no pleural effusion or pneumothorax. The heart and pulmonary vascularity are normal. The mediastinum is normal in width. There is calcification in the wall of the aortic arch. The bony thorax exhibits no acute abnormality. There is multilevel degenerative disc disease of the thoracic spine. IMPRESSION: There is no acute pneumonia. There are mild chronic bronchitic changes. Aortic atherosclerosis. Electronically Signed   By: David  Martinique M.D.   On: 10/19/2015 09:42   Assessment & Plan:  1. Chest tightness 2. Dyspnea, unspecified type - DG Chest 2 View; Future - ipratropium (ATROVENT) nebulizer solution 0.5 mg; Take 2.5 mLs (0.5 mg total) by nebulization once. - albuterol (PROVENTIL) (2.5 MG/3ML) 0.083% nebulizer solution 2.5 mg; Take 3 mLs (2.5 mg total) by nebulization once. 3. Acute bronchitis due to other specified organisms  65 year old, female is slightly ill appearing, without distress. She has a persistent cough with shortness of breath.  Air movement on auscultation is diminished upper chest wall bilaterally. Chest x-ray obtained to rule out pneumonia due to patient has been recovering from abdominal surgery.She is afebrile. Mildly tachycardic likely due to shortness of breath.  Plan: Acute Bronchitis  Amoxicillin-Clavulanate 875-125 mg, twice daily for 10 days. Benzonatate 100-200 mg 3 times daily as needed. Albuterol 108 mcg/act inhaler 2 puff, every 4 hours as needed. DG Chest View- negative of acute findings.  Aortic Atherosclerosis noted on x-ray. Referring to cardiology to consider echocardiogram. Patient has a positive history of DVT and pulmonary embolism.  Follow-up if symptoms do not improve.  Carroll Sage. Kenton Kingfisher, MSN, FNP-C [facility]

## 2015-10-27 DIAGNOSIS — J40 Bronchitis, not specified as acute or chronic: Secondary | ICD-10-CM | POA: Diagnosis not present

## 2015-10-27 DIAGNOSIS — Z6830 Body mass index (BMI) 30.0-30.9, adult: Secondary | ICD-10-CM | POA: Diagnosis not present

## 2015-10-27 DIAGNOSIS — J329 Chronic sinusitis, unspecified: Secondary | ICD-10-CM | POA: Diagnosis not present

## 2015-10-27 DIAGNOSIS — R938 Abnormal findings on diagnostic imaging of other specified body structures: Secondary | ICD-10-CM | POA: Diagnosis not present

## 2015-10-27 MED FILL — PROAIR RESPICLICK INHAL PWD: 108 (90 BAS | 30 days supply | Qty: 1 | Fill #0

## 2015-10-27 MED FILL — CEFDINIR 300 MG CAPSULE: 300 | 5 days supply | Qty: 10 | Fill #0

## 2015-10-27 MED FILL — BENZONATATE 200 MG CAPSULE: 200 | 20 days supply | Qty: 60 | Fill #0

## 2015-11-02 ENCOUNTER — Ambulatory Visit
Admission: RE | Admit: 2015-11-02 | Discharge: 2015-11-02 | Disposition: A | Discharge status: Home / Self Care | Payer: 59 | Source: Ambulatory Visit | Attending: Family Medicine | Admitting: Family Medicine

## 2015-11-02 ENCOUNTER — Other Ambulatory Visit: Payer: Self-pay | Admitting: Family Medicine

## 2015-11-02 DIAGNOSIS — R9389 Abnormal findings on diagnostic imaging of other specified body structures: Secondary | ICD-10-CM

## 2015-11-02 DIAGNOSIS — J40 Bronchitis, not specified as acute or chronic: Secondary | ICD-10-CM | POA: Diagnosis not present

## 2015-11-04 DIAGNOSIS — J449 Chronic obstructive pulmonary disease, unspecified: Secondary | ICD-10-CM | POA: Diagnosis not present

## 2015-11-04 DIAGNOSIS — J189 Pneumonia, unspecified organism: Secondary | ICD-10-CM | POA: Diagnosis not present

## 2015-11-04 DIAGNOSIS — Z23 Encounter for immunization: Secondary | ICD-10-CM | POA: Diagnosis not present

## 2015-11-04 DIAGNOSIS — J40 Bronchitis, not specified as acute or chronic: Secondary | ICD-10-CM | POA: Diagnosis not present

## 2015-11-04 DIAGNOSIS — J329 Chronic sinusitis, unspecified: Secondary | ICD-10-CM | POA: Diagnosis not present

## 2015-11-07 MED FILL — DULoxetine HCL 60 MG CPEP: 60 | 90 days supply | Qty: 90 | Fill #2

## 2015-11-07 MED FILL — VALSARTAN 80 MG TABLET: 80 | 90 days supply | Qty: 90 | Fill #2

## 2015-11-07 MED FILL — MONTELUKAST SOD 10 MG TAB: 10 | 90 days supply | Qty: 90 | Fill #1

## 2015-11-07 MED FILL — OXYCODONE W/APAP 5/325 TAB: 5-325 | 3 days supply | Qty: 15 | Fill #0

## 2015-11-07 MED FILL — DOXYCYCLINE MONO 100 MG CAP: 100 | 2 days supply | Qty: 4 | Fill #0

## 2015-12-12 DIAGNOSIS — J069 Acute upper respiratory infection, unspecified: Secondary | ICD-10-CM | POA: Diagnosis not present

## 2015-12-12 MED FILL — AMOX-CLAV 875-125 MG TABLET: 875-125 | 10 days supply | Qty: 20 | Fill #0

## 2015-12-22 DIAGNOSIS — R0981 Nasal congestion: Secondary | ICD-10-CM | POA: Diagnosis not present

## 2015-12-22 DIAGNOSIS — J449 Chronic obstructive pulmonary disease, unspecified: Secondary | ICD-10-CM | POA: Diagnosis not present

## 2015-12-22 DIAGNOSIS — N760 Acute vaginitis: Secondary | ICD-10-CM | POA: Diagnosis not present

## 2015-12-22 DIAGNOSIS — Z6830 Body mass index (BMI) 30.0-30.9, adult: Secondary | ICD-10-CM | POA: Diagnosis not present

## 2015-12-22 MED FILL — FLUCONAZOLE 150 MG TABLET: 150 | 7 days supply | Qty: 2 | Fill #0

## 2016-01-23 DIAGNOSIS — J449 Chronic obstructive pulmonary disease, unspecified: Secondary | ICD-10-CM | POA: Diagnosis not present

## 2016-01-23 DIAGNOSIS — J189 Pneumonia, unspecified organism: Secondary | ICD-10-CM | POA: Diagnosis not present

## 2016-01-23 DIAGNOSIS — Z6830 Body mass index (BMI) 30.0-30.9, adult: Secondary | ICD-10-CM | POA: Diagnosis not present

## 2016-01-23 DIAGNOSIS — J40 Bronchitis, not specified as acute or chronic: Secondary | ICD-10-CM | POA: Diagnosis not present

## 2016-02-02 MED FILL — BREO ELLIPTA 100-25 MCG INH: 100-25 | 30 days supply | Qty: 60 | Fill #0

## 2016-02-17 MED FILL — VALSARTAN 80 MG TABLET: 80 | 90 days supply | Qty: 90 | Fill #0

## 2016-02-17 MED FILL — DULoxetine HCL 60 MG CPEP: 60 | 90 days supply | Qty: 90 | Fill #0

## 2016-02-20 DIAGNOSIS — E559 Vitamin D deficiency, unspecified: Secondary | ICD-10-CM | POA: Diagnosis not present

## 2016-02-20 DIAGNOSIS — Z0000 Encounter for general adult medical examination without abnormal findings: Secondary | ICD-10-CM | POA: Diagnosis not present

## 2016-02-22 DIAGNOSIS — I10 Essential (primary) hypertension: Secondary | ICD-10-CM | POA: Diagnosis not present

## 2016-02-22 DIAGNOSIS — Z87891 Personal history of nicotine dependence: Secondary | ICD-10-CM | POA: Diagnosis not present

## 2016-02-22 DIAGNOSIS — Z1211 Encounter for screening for malignant neoplasm of colon: Secondary | ICD-10-CM | POA: Diagnosis not present

## 2016-02-22 DIAGNOSIS — J329 Chronic sinusitis, unspecified: Secondary | ICD-10-CM | POA: Diagnosis not present

## 2016-02-22 DIAGNOSIS — E559 Vitamin D deficiency, unspecified: Secondary | ICD-10-CM | POA: Diagnosis not present

## 2016-02-22 DIAGNOSIS — Z23 Encounter for immunization: Secondary | ICD-10-CM | POA: Diagnosis not present

## 2016-02-22 DIAGNOSIS — Z0000 Encounter for general adult medical examination without abnormal findings: Secondary | ICD-10-CM | POA: Diagnosis not present

## 2016-02-22 MED FILL — ROSUVASTATIN CALCIUM 5 MG T: 5 | 90 days supply | Qty: 90 | Fill #0

## 2016-02-23 MED FILL — NICOTINE 21 MG/24HR PATCH: 21 | 28 days supply | Qty: 28 | Fill #0

## 2016-04-03 MED FILL — NICOTINE 21 MG/24HR PATCH: 21 | 28 days supply | Qty: 28 | Fill #1

## 2016-04-23 ENCOUNTER — Encounter: Payer: Self-pay | Admitting: Gynecology

## 2016-04-23 ENCOUNTER — Ambulatory Visit (INDEPENDENT_AMBULATORY_CARE_PROVIDER_SITE_OTHER): Payer: 59 | Admitting: Gynecology

## 2016-04-23 VITALS — BP 124/78 | Ht 67.0 in | Wt 199.0 lb

## 2016-04-23 DIAGNOSIS — F17200 Nicotine dependence, unspecified, uncomplicated: Secondary | ICD-10-CM | POA: Insufficient documentation

## 2016-04-23 DIAGNOSIS — Z8262 Family history of osteoporosis: Secondary | ICD-10-CM | POA: Diagnosis not present

## 2016-04-23 DIAGNOSIS — E782 Mixed hyperlipidemia: Secondary | ICD-10-CM | POA: Diagnosis not present

## 2016-04-23 DIAGNOSIS — Z01419 Encounter for gynecological examination (general) (routine) without abnormal findings: Secondary | ICD-10-CM

## 2016-04-23 DIAGNOSIS — M810 Age-related osteoporosis without current pathological fracture: Secondary | ICD-10-CM

## 2016-04-23 MED ORDER — ESTRADIOL 10 MCG VA TABS: 1.0000 | ORAL_TABLET | VAGINAL | 11 refills | Status: DC

## 2016-04-23 MED FILL — YUVAFEM 10 MCG VAGINAL INSE: 10 | 28 days supply | Qty: 8 | Fill #0

## 2016-04-23 NOTE — Patient Instructions (Signed)
Denosumab injection What is this medicine? DENOSUMAB (den oh sue mab) slows bone breakdown. Prolia is used to treat osteoporosis in women after menopause and in men. Delton See is used to treat a high calcium level due to cancer and to prevent bone fractures and other bone problems caused by multiple myeloma or cancer bone metastases. Delton See is also used to treat giant cell tumor of the bone. This medicine may be used for other purposes; ask your health care provider or pharmacist if you have questions. COMMON BRAND NAME(S): Prolia, XGEVA What should I tell my health care provider before I take this medicine? They need to know if you have any of these conditions: -dental disease -having surgery or tooth extraction -infection -kidney disease -low levels of calcium or Vitamin D in the blood -malnutrition -on hemodialysis -skin conditions or sensitivity -thyroid or parathyroid disease -an unusual reaction to denosumab, other medicines, foods, dyes, or preservatives -pregnant or trying to get pregnant -breast-feeding How should I use this medicine? This medicine is for injection under the skin. It is given by a health care professional in a hospital or clinic setting. If you are getting Prolia, a special MedGuide will be given to you by the pharmacist with each prescription and refill. Be sure to read this information carefully each time. For Prolia, talk to your pediatrician regarding the use of this medicine in children. Special care may be needed. For Delton See, talk to your pediatrician regarding the use of this medicine in children. While this drug may be prescribed for children as young as 13 years for selected conditions, precautions do apply. Overdosage: If you think you have taken too much of this medicine contact a poison control center or emergency room at once. NOTE: This medicine is only for you. Do not share this medicine with others. What if I miss a dose? It is important not to miss your  dose. Call your doctor or health care professional if you are unable to keep an appointment. What may interact with this medicine? Do not take this medicine with any of the following medications: -other medicines containing denosumab This medicine may also interact with the following medications: -medicines that lower your chance of fighting infection -steroid medicines like prednisone or cortisone This list may not describe all possible interactions. Give your health care provider a list of all the medicines, herbs, non-prescription drugs, or dietary supplements you use. Also tell them if you smoke, drink alcohol, or use illegal drugs. Some items may interact with your medicine. What should I watch for while using this medicine? Visit your doctor or health care professional for regular checks on your progress. Your doctor or health care professional may order blood tests and other tests to see how you are doing. Call your doctor or health care professional for advice if you get a fever, chills or sore throat, or other symptoms of a cold or flu. Do not treat yourself. This drug may decrease your body's ability to fight infection. Try to avoid being around people who are sick. You should make sure you get enough calcium and vitamin D while you are taking this medicine, unless your doctor tells you not to. Discuss the foods you eat and the vitamins you take with your health care professional. See your dentist regularly. Brush and floss your teeth as directed. Before you have any dental work done, tell your dentist you are receiving this medicine. Do not become pregnant while taking this medicine or for 5 months after stopping  it. Talk with your doctor or health care professional about your birth control options while taking this medicine. Women should inform their doctor if they wish to become pregnant or think they might be pregnant. There is a potential for serious side effects to an unborn child. Talk  to your health care professional or pharmacist for more information. What side effects may I notice from receiving this medicine? Side effects that you should report to your doctor or health care professional as soon as possible: -allergic reactions like skin rash, itching or hives, swelling of the face, lips, or tongue -bone pain -breathing problems -dizziness -jaw pain, especially after dental work -redness, blistering, peeling of the skin -signs and symptoms of infection like fever or chills; cough; sore throat; pain or trouble passing urine -signs of low calcium like fast heartbeat, muscle cramps or muscle pain; pain, tingling, numbness in the hands or feet; seizures -unusual bleeding or bruising -unusually weak or tired Side effects that usually do not require medical attention (report to your doctor or health care professional if they continue or are bothersome): -constipation -diarrhea -headache -joint pain -loss of appetite -muscle pain -runny nose -tiredness -upset stomach This list may not describe all possible side effects. Call your doctor for medical advice about side effects. You may report side effects to FDA at 1-800-FDA-1088. Where should I keep my medicine? This medicine is only given in a clinic, doctor's office, or other health care setting and will not be stored at home. NOTE: This sheet is a summary. It may not cover all possible information. If you have questions about this medicine, talk to your doctor, pharmacist, or health care provider.  2018 Elsevier/Gold Standard (2016-01-17 19:17:21) Osteoporosis Osteoporosis is the thinning and loss of density in the bones. Osteoporosis makes the bones more brittle, fragile, and likely to break (fracture). Over time, osteoporosis can cause the bones to become so weak that they fracture after a simple fall. The bones most likely to fracture are the bones in the hip, wrist, and spine. What are the causes? The exact cause  is not known. What increases the risk? Anyone can develop osteoporosis. You may be at greater risk if you have a family history of the condition or have poor nutrition. You may also have a higher risk if you are:  Female.  20 years old or older.  A smoker.  Not physically active.  White or Asian.  Slender. What are the signs or symptoms? A fracture might be the first sign of the disease, especially if it results from a fall or injury that would not usually cause a bone to break. Other signs and symptoms include:  Low back and neck pain.  Stooped posture.  Height loss. How is this diagnosed? To make a diagnosis, your health care provider may:  Take a medical history.  Perform a physical exam.  Order tests, such as:  A bone mineral density test.  A dual-energy X-ray absorptiometry test. How is this treated? The goal of osteoporosis treatment is to strengthen your bones to reduce your risk of a fracture. Treatment may involve:  Making lifestyle changes, such as:  Eating a diet rich in calcium.  Doing weight-bearing and muscle-strengthening exercises.  Stopping tobacco use.  Limiting alcohol intake.  Taking medicine to slow the process of bone loss or to increase bone density.  Monitoring your levels of calcium and vitamin D. Follow these instructions at home:  Include calcium and vitamin D in your diet. Calcium is  important for bone health, and vitamin D helps the body absorb calcium.  Perform weight-bearing and muscle-strengthening exercises as directed by your health care provider.  Do not use any tobacco products, including cigarettes, chewing tobacco, and electronic cigarettes. If you need help quitting, ask your health care provider.  Limit your alcohol intake.  Take medicines only as directed by your health care provider.  Keep all follow-up visits as directed by your health care provider. This is important.  Take precautions at home to lower your  risk of falling, such as:  Keeping rooms well lit and clutter free.  Installing safety rails on stairs.  Using rubber mats in the bathroom and other areas that are often wet or slippery. Get help right away if: You fall or injure yourself. This information is not intended to replace advice given to you by your health care provider. Make sure you discuss any questions you have with your health care provider. Document Released: 10/04/2004 Document Revised: 05/30/2015 Document Reviewed: 06/04/2013 Elsevier Interactive Patient Education  2017 [employer]. Steps to Quit Smoking Smoking tobacco can be harmful to your health and can affect almost every organ in your body. Smoking puts you, and those around you, at risk for developing many serious chronic diseases. Quitting smoking is difficult, but it is one of the best things that you can do for your health. It is never too late to quit. What are the benefits of quitting smoking? When you quit smoking, you lower your risk of developing serious diseases and conditions, such as:  Lung cancer or lung disease, such as COPD.  Heart disease.  Stroke.  Heart attack.  Infertility.  Osteoporosis and bone fractures. Additionally, symptoms such as coughing, wheezing, and shortness of breath may get better when you quit. You may also find that you get sick less often because your body is stronger at fighting off colds and infections. If you are pregnant, quitting smoking can help to reduce your chances of having a baby of low birth weight. How do I get ready to quit? When you decide to quit smoking, create a plan to make sure that you are successful. Before you quit:  Pick a date to quit. Set a date within the next two weeks to give you time to prepare.  Write down the reasons why you are quitting. Keep this list in places where you will see it often, such as on your bathroom mirror or in your car or wallet.  Identify the people, places, things,  and activities that make you want to smoke (triggers) and avoid them. Make sure to take these actions:  Throw away all cigarettes at home, at work, and in your car.  Throw away smoking accessories, such as Scientist, research (medical).  Clean your car and make sure to empty the ashtray.  Clean your home, including curtains and carpets.  Tell your family, friends, and coworkers that you are quitting. Support from your loved ones can make quitting easier.  Talk with your health care provider about your options for quitting smoking.  Find out what treatment options are covered by your health insurance. What strategies can I use to quit smoking? Talk with your healthcare provider about different strategies to quit smoking. Some strategies include:  Quitting smoking altogether instead of gradually lessening how much you smoke over a period of time. Research shows that quitting "cold [place]" is more successful than gradually quitting.  Attending in-person counseling to help you build problem-solving skills. You are more  likely to have success in quitting if you attend several counseling sessions. Even short sessions of 10 minutes can be effective.  Finding resources and support systems that can help you to quit smoking and remain smoke-free after you quit. These resources are most helpful when you use them often. They can include:  Online chats with a Social worker.  Telephone quitlines.  Printed Furniture conservator/restorer.  Support groups or group counseling.  Text messaging programs.  Mobile phone applications.  Taking medicines to help you quit smoking. (If you are pregnant or breastfeeding, talk with your health care provider first.) Some medicines contain nicotine and some do not. Both types of medicines help with cravings, but the medicines that include nicotine help to relieve withdrawal symptoms. Your health care provider may recommend:  Nicotine patches, gum, or lozenges.  Nicotine inhalers  or sprays.  Non-nicotine medicine that is taken by mouth. Talk with your health care provider about combining strategies, such as taking medicines while you are also receiving in-person counseling. Using these two strategies together makes you more likely to succeed in quitting than if you used either strategy on its own. If you are pregnant or breastfeeding, talk with your health care provider about finding counseling or other support strategies to quit smoking. Do not take medicine to help you quit smoking unless told to do so by your health care provider. What things can I do to make it easier to quit? Quitting smoking might feel overwhelming at first, but there is a lot that you can do to make it easier. Take these important actions:  Reach out to your family and friends and ask that they support and encourage you during this time. Call telephone quitlines, reach out to support groups, or work with a counselor for support.  Ask people who smoke to avoid smoking around you.  Avoid places that trigger you to smoke, such as bars, parties, or smoke-break areas at work.  Spend time around people who do not smoke.  Lessen stress in your life, because stress can be a smoking trigger for some people. To lessen stress, try:  Exercising regularly.  Deep-breathing exercises.  Yoga.  Meditating.  Performing a body scan. This involves closing your eyes, scanning your body from head to toe, and noticing which parts of your body are particularly tense. Purposefully relax the muscles in those areas.  Download or purchase mobile phone or tablet apps (applications) that can help you stick to your quit plan by providing reminders, tips, and encouragement. There are many free apps, such as QuitGuide from the [employer] Office manager for Disease Control and Prevention). You can find other support for quitting smoking (smoking cessation) through smokefree.gov and other websites. How will I feel when I quit  smoking? Within the first 24 hours of quitting smoking, you may start to feel some withdrawal symptoms. These symptoms are usually most noticeable 2-3 days after quitting, but they usually do not last beyond 2-3 weeks. Changes or symptoms that you might experience include:  Mood swings.  Restlessness, anxiety, or irritation.  Difficulty concentrating.  Dizziness.  Strong cravings for sugary foods in addition to nicotine.  Mild weight gain.  Constipation.  Nausea.  Coughing or a sore throat.  Changes in how your medicines work in your body.  A depressed mood.  Difficulty sleeping (insomnia). After the first 2-3 weeks of quitting, you may start to notice more positive results, such as:  Improved sense of smell and taste.  Decreased coughing and sore throat.  Slower heart rate.  Lower blood pressure.  Clearer skin.  The ability to breathe more easily.  Fewer sick days. Quitting smoking is very challenging for most people. Do not get discouraged if you are not successful the first time. Some people need to make many attempts to quit before they achieve long-term success. Do your best to stick to your quit plan, and talk with your health care provider if you have any questions or concerns. This information is not intended to replace advice given to you by your health care provider. Make sure you discuss any questions you have with your health care provider. Document Released: 12/19/2000 Document Revised: 08/23/2015 Document Reviewed: 05/11/2014 Elsevier Interactive Patient Education  2017 [employer].

## 2016-04-23 NOTE — Progress Notes (Signed)
Sharon Daniels 19-Oct-1950 [id number]   History:    66 y.o.  for annual gyn exam with no complaints today. Patient had been seen in consultation in the office on May 4 as a result of her bone density study which had indicated that she was osteoporotic. We had no other bone density study to compare but the patient has had been diagnosed with osteopenia and another provider had her on Boniva and later Actonel which she discontinued because she had issues with compliance. She was smoking a pack a cigarettes per day and currently smoking a pack per week. She also suffers from gastroesophageal reflux disease and for this reason last year we will recommending  Prolia. 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  Bone density done on 04/04/2015 at [facility] demonstrated the following: Right femoral neck T score -2.0 when compared with 2013 no statistically significant change. Left femoral neck T score -2.70 with -12% change in bone mineralization 1 compare with 2013 significant AP T score -1.6 .0 with a negative a percent change in bone mineralization from previous scan 2013.  Based on this analysis patient bone mass 25% below normal and has an 11 time greater risk of hip fracture indicating need for treatment.   We had talked about the following risk from taking Prolia as follows: The risk and benefits of Prolia were discussed with the patient today to include a significant risk reduction for vertebral, hip and non-vertebral fractures. Potential risk also discussed were skin advanced to include rash, pruritus, eczema and other skin reactions. The recurrence of infections and clinical studies with PROLIA, including serious infections were also discussed with the patient. In 3 year clinical trial with Prolia although  six-year trial data does not show any evidence for any additional or accelerating risk of infection. Other risks that were discussed included a 1 in 1000-10,000 risk of osteonecrosis of the jaw based on current available data.  Her PCP has been doing her blood work. Her last colonoscopy was in 2016 benign polyps were removed. Patient been on vaginal estrogen twice a week for her atrophy. Patient with no past history of abnormal Pap smears.  Past medical history,surgical history, family history and social history were all reviewed and documented in the EPIC chart.  Gynecologic History No LMP recorded. Patient is postmenopausal. Contraception: post menopausal status Last Pap: 2017. Results were: normal Last mammogram: 2017. Results were: normal  Obstetric History OB History  Gravida Para Term Preterm AB Living  3 2     1 2   SAB TAB Ectopic Multiple Live Births  1            # Outcome Date GA Lbr Len/2nd Weight Sex Delivery Anes PTL Lv  3 SAB           2 Para           1 Para                ROS: A ROS was performed and pertinent positives and negatives are included in the history.  GENERAL: No fevers or chills. HEENT: No change in vision, no earache, sore throat or sinus congestion. NECK: No pain or stiffness. CARDIOVASCULAR: No chest pain or pressure. No palpitations. PULMONARY: No shortness of breath, cough or wheeze. GASTROINTESTINAL: No abdominal pain, nausea, vomiting or diarrhea,  melena or bright red blood per rectum. GENITOURINARY: No urinary frequency, urgency, hesitancy or dysuria. MUSCULOSKELETAL: No joint or muscle pain, no back pain, no recent trauma. DERMATOLOGIC: No rash, no itching, no lesions. ENDOCRINE: No polyuria, polydipsia, no heat or cold intolerance. No recent change in weight. HEMATOLOGICAL: No anemia or easy bruising or bleeding. NEUROLOGIC: No headache, seizures, numbness, tingling or weakness. PSYCHIATRIC: No depression, no loss of interest in normal activity or  change in sleep pattern.     Exam: chaperone present  BP 124/78   Ht 5\' 7"  (1.702 m)   Wt 199 lb (90.3 kg)   BMI 31.17 kg/m   Body mass index is 31.17 kg/m.  General appearance : Well developed well nourished female. No acute distress HEENT: Eyes: no retinal hemorrhage or exudates,  Neck supple, trachea midline, no carotid bruits, no thyroidmegaly Lungs: Clear to auscultation, no rhonchi or wheezes, or rib retractions  Heart: Regular rate and rhythm, no murmurs or gallops Breast:Examined in sitting and supine position were symmetrical in appearance, no palpable masses or tenderness,  no skin retraction, no nipple inversion, no nipple discharge, no skin discoloration, no axillary or supraclavicular lymphadenopathy Abdomen: no palpable masses or tenderness, no rebound or guarding Extremities: no edema or skin discoloration or tenderness  Pelvic:  Bartholin, Urethra, Skene Glands: Within normal limits             Vagina: No gross lesions or discharge, atrophic changes  Cervix: No gross lesions or discharge  Uterus  anteverted, normal size, shape and consistency, non-tender and mobile  Adnexa  Without masses or tenderness  Anus and perineum  normal   Rectovaginal  normal sphincter tone without palpated masses or tenderness             Hemoccult PCP provides     Assessment/Plan:  66 y.o. female for annual exam with history of vaginal atrophy I have recommended that instead of the vaginal estrogen cream that we place her on 10 g of Vagifem which is an ultra low dose to minimize the risk of any absorption since her past history of DVT after surgery many years ago. We are going to make arrangements for her to begin Prolia as a result of her osteoporosis. Her risk are: Postmenopausal, smoker, family history of osteoporosis. We are going to check her vitamin D level today. Pap smear not done today. We discussed importance of calcium vitamin D and weightbearing exercises.   Terrance Mass MD, 2:31 PM 04/23/2016

## 2016-04-24 ENCOUNTER — Telehealth: Payer: Self-pay | Admitting: Gynecology

## 2016-04-24 DIAGNOSIS — M810 Age-related osteoporosis without current pathological fracture: Secondary | ICD-10-CM

## 2016-04-24 MED ORDER — DENOSUMAB 60 MG/ML ~~LOC~~ SOLN: 60.0000 mg | Freq: Once | SUBCUTANEOUS | 0 refills | Status: AC

## 2016-04-24 MED FILL — PROLIA 60 MG/ML SOLN: 60 | 30 days supply | Qty: 1 | Fill #0

## 2016-04-24 NOTE — Telephone Encounter (Signed)
Sharon Daniels, patient with Osteoporosis needs to be on Prolia. Tried Boniva and Actonel before compliance issue. Note from Dr Toney Rakes Calcium 9.4  07/27/15  Complete exam JF 04/23/16 , insurance UMR with [facility] Sinner will need pharmacy RX.

## 2016-04-25 DIAGNOSIS — Z6830 Body mass index (BMI) 30.0-30.9, adult: Secondary | ICD-10-CM | POA: Diagnosis not present

## 2016-04-25 DIAGNOSIS — Z23 Encounter for immunization: Secondary | ICD-10-CM | POA: Diagnosis not present

## 2016-04-25 DIAGNOSIS — I10 Essential (primary) hypertension: Secondary | ICD-10-CM | POA: Diagnosis not present

## 2016-04-25 DIAGNOSIS — E782 Mixed hyperlipidemia: Secondary | ICD-10-CM | POA: Diagnosis not present

## 2016-04-25 DIAGNOSIS — J449 Chronic obstructive pulmonary disease, unspecified: Secondary | ICD-10-CM | POA: Diagnosis not present

## 2016-04-25 MED FILL — NICOTINE 14 MG/24HR PATCH: 14 | 28 days supply | Qty: 28 | Fill #0

## 2016-04-26 ENCOUNTER — Other Ambulatory Visit: Payer: 59

## 2016-04-26 DIAGNOSIS — M810 Age-related osteoporosis without current pathological fracture: Secondary | ICD-10-CM | POA: Diagnosis not present

## 2016-04-27 LAB — VITAMIN D 25 HYDROXY (VIT D DEFICIENCY, FRACTURES): Vit D, 25-Hydroxy: 54 ng/mL (ref 30–100)

## 2016-05-01 DIAGNOSIS — Z1231 Encounter for screening mammogram for malignant neoplasm of breast: Secondary | ICD-10-CM | POA: Diagnosis not present

## 2016-05-02 NOTE — Telephone Encounter (Signed)
PC to pt unable to leave a message due to full messages.

## 2016-05-14 NOTE — Telephone Encounter (Signed)
PC to pt. She picked up Prolia syringe and was charged $200  She was not instructed by pharmacy to ref medication. She has syringe at home. She has not applied for Prolia card. She is applying today. I talked with Benjamine Mola at [facility] and she will call [employer] to check on the Prolia at room temp. She will let me know. I told her I will call her with Prolia debit card information.

## 2016-05-15 NOTE — Telephone Encounter (Signed)
Sounds to me like she is within the 14 days before refrigeration. She can have medication according to pharmacy

## 2016-05-15 NOTE — Telephone Encounter (Addendum)
Prolia card activated [id number]  RX BIN [id number].   Copy of card to be given to Pharmacy. Left a message to call me regarding her Prolia injection .  Calcium 9.4 07/27/15  Talked with Benjamine Mola at [facility]. Pt had a physical change of address she picked up Prolia from [facility] on 04/27/16, she did not place the medication in the refrigerator until 05/02/16. Elizabeth pharmacist at [facility] and they stated that the medication is stable for 14 days at room temperature. I will forward this information to Dr Toney Rakes to gain approval for injection to be scheduled.

## 2016-05-15 NOTE — Telephone Encounter (Signed)
Appointment 05/18/16  At 11 am with Nurse patient is  bringing in [place] so MAKE sure that box is checked that pt supplied meds.

## 2016-05-16 NOTE — Telephone Encounter (Signed)
Pharmacy refund to pt . Due to Prolia debit card benefit. Pt had $25 co pay

## 2016-05-18 ENCOUNTER — Ambulatory Visit (INDEPENDENT_AMBULATORY_CARE_PROVIDER_SITE_OTHER): Payer: 59 | Admitting: Gynecology

## 2016-05-18 DIAGNOSIS — M810 Age-related osteoporosis without current pathological fracture: Secondary | ICD-10-CM | POA: Diagnosis not present

## 2016-05-18 MED ORDER — DENOSUMAB 60 MG/ML ~~LOC~~ SOLN
60.0000 mg | Freq: Once | SUBCUTANEOUS | Status: AC
  Administered 2016-05-18: 60 mg via SUBCUTANEOUS

## 2016-05-22 NOTE — Telephone Encounter (Signed)
Pt injection 05/18/16  Next injection due 11/19/16

## 2016-05-23 ENCOUNTER — Encounter: Payer: Self-pay | Admitting: Gynecology

## 2016-05-24 ENCOUNTER — Encounter: Payer: Self-pay | Admitting: Anesthesiology

## 2016-05-24 MED FILL — DULoxetine HCL 60 MG CPEP: 60 | 90 days supply | Qty: 90 | Fill #1

## 2016-05-24 MED FILL — MONTELUKAST SOD 10 MG TAB: 10 | 90 days supply | Qty: 90 | Fill #0

## 2016-05-24 MED FILL — VALSARTAN 80 MG TABLET: 80 | 90 days supply | Qty: 90 | Fill #1

## 2016-06-12 MED FILL — YUVAFEM 10 MCG VAGINAL INSE: 10 | 28 days supply | Qty: 8 | Fill #1

## 2016-06-12 MED FILL — ROSUVASTATIN CALCIUM 5 MG T: 5 | 90 days supply | Qty: 90 | Fill #1

## 2016-06-21 DIAGNOSIS — H52223 Regular astigmatism, bilateral: Secondary | ICD-10-CM | POA: Diagnosis not present

## 2016-06-21 DIAGNOSIS — H524 Presbyopia: Secondary | ICD-10-CM | POA: Diagnosis not present

## 2016-06-21 DIAGNOSIS — H35031 Hypertensive retinopathy, right eye: Secondary | ICD-10-CM | POA: Diagnosis not present

## 2016-06-21 DIAGNOSIS — H25813 Combined forms of age-related cataract, bilateral: Secondary | ICD-10-CM | POA: Diagnosis not present

## 2016-06-21 DIAGNOSIS — I10 Essential (primary) hypertension: Secondary | ICD-10-CM | POA: Diagnosis not present

## 2016-06-21 DIAGNOSIS — H5203 Hypermetropia, bilateral: Secondary | ICD-10-CM | POA: Diagnosis not present

## 2016-06-21 DIAGNOSIS — H34821 Venous engorgement, right eye: Secondary | ICD-10-CM | POA: Diagnosis not present

## 2016-06-21 DIAGNOSIS — H3509 Other intraretinal microvascular abnormalities: Secondary | ICD-10-CM | POA: Diagnosis not present

## 2016-07-25 MED FILL — LOSARTAN POTASSIUM 25 MG TA: 25 | 90 days supply | Qty: 90 | Fill #0

## 2016-08-17 MED FILL — YUVAFEM 10 MCG VAGINAL INSE: 10 | 28 days supply | Qty: 8 | Fill #2

## 2016-08-28 ENCOUNTER — Ambulatory Visit (INDEPENDENT_AMBULATORY_CARE_PROVIDER_SITE_OTHER): Payer: 59 | Admitting: Sports Medicine

## 2016-08-28 ENCOUNTER — Ambulatory Visit (INDEPENDENT_AMBULATORY_CARE_PROVIDER_SITE_OTHER): Payer: 59

## 2016-08-28 ENCOUNTER — Encounter: Payer: Self-pay | Admitting: Sports Medicine

## 2016-08-28 VITALS — BP 124/73 | HR 59

## 2016-08-28 DIAGNOSIS — Z23 Encounter for immunization: Secondary | ICD-10-CM | POA: Diagnosis not present

## 2016-08-28 DIAGNOSIS — Q828 Other specified congenital malformations of skin: Secondary | ICD-10-CM | POA: Diagnosis not present

## 2016-08-28 DIAGNOSIS — B351 Tinea unguium: Secondary | ICD-10-CM

## 2016-08-28 DIAGNOSIS — M79671 Pain in right foot: Secondary | ICD-10-CM | POA: Diagnosis not present

## 2016-08-28 DIAGNOSIS — M216X1 Other acquired deformities of right foot: Secondary | ICD-10-CM | POA: Diagnosis not present

## 2016-08-28 DIAGNOSIS — L603 Nail dystrophy: Secondary | ICD-10-CM | POA: Diagnosis not present

## 2016-08-28 DIAGNOSIS — L601 Onycholysis: Secondary | ICD-10-CM | POA: Diagnosis not present

## 2016-08-28 NOTE — Progress Notes (Signed)
Subjective: CAROLEE CHANNELL is a 66 y.o. female patient seen today in office with complaint of painful thickened and discolored nails. Patient is desiring treatment for nail changes; has tried pedicures in the past with no improvement. Reports that nails are becoming difficult to manage because of the thickness. Patient also admits to discomfort at the ball of her right foot. States that she has seen many doctors where she had the hard callus lesion trimmed or cut out. However, still keeps coming back. Patient has also tried to shave down the lesion herself and has also tried cushion pad and over-the-counter creams with no improvement. Patient has no other pedal complaints at this time.   Patient Active Problem List   Diagnosis Date Noted  . Smoker 04/23/2016  . Breast hypertrophy 08/04/2015  . Osteoporosis 05/12/2015  . History of colonic polyps 04/21/2015  . DVT, lower extremity, distal, acute ([place]) 06/17/2013  . Ileus, postoperative ([place]) 06/17/2013  . 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 Hypokalemia 06/17/2013  . Acute pulmonary embolism ([place]) 06/13/2013  . Postoperative anemia due to acute blood loss 06/09/2013  . Transfusion history 06/09/2013  . OA (osteoarthritis) of knee 06/05/2013  . HIATAL HERNIA 06/18/2007  . FATTY LIVER DISEASE 06/18/2007    Current Outpatient Prescriptions on File Prior to Visit  Medication Sig Dispense Refill  . acetaminophen (TYLENOL) 500 MG tablet Take 500 mg by mouth every 6 (six) hours as needed.    Marland Kitchen albuterol (PROVENTIL HFA;VENTOLIN HFA) 108 (90 Base) MCG/ACT inhaler Inhale 2 puffs into the lungs every 4 (four) hours as needed for wheezing or shortness of breath (cough, shortness of breath or wheezing.). 1 Inhaler 1  . Cholecalciferol (VITAMIN D3) 1000 UNITS CAPS Take 10,000 Units by mouth daily.     . DULoxetine (CYMBALTA) 60 MG capsule Take 60 mg by mouth daily.    . Estradiol 10 MCG TABS vaginal tablet Place 1 tablet (10 mcg total)  vaginally 2 (two) times a week. 8 tablet 11  . fluticasone (FLONASE) 50 MCG/ACT nasal spray Place 1 spray into both nostrils daily as needed (for seasonal allergies). Reported on 04/21/2015    . montelukast (SINGULAIR) 10 MG tablet Take 10 mg by mouth daily.    Marland Kitchen omeprazole (PRILOSEC) 40 MG capsule Take 40 mg by mouth daily as needed (for acid reflux or heartburn). Reported on 04/21/2015    . Propylene Glycol (SYSTANE BALANCE) 0.6 % SOLN Apply 1 drop to eye as needed (Dry eyes). Reported on 04/21/2015    . amoxicillin-clavulanate (AUGMENTIN) 875-125 MG tablet Take 1 tablet by mouth 2 (two) times daily. (Patient not taking: Reported on 04/23/2016) 20 tablet 0  . benzonatate (TESSALON) 100 MG capsule Take 1-2 capsules (100-200 mg total) by mouth 3 (three) times daily as needed for cough. (Patient not taking: Reported on 08/28/2016) 40 capsule 0  . denosumab (PROLIA) 60 MG/ML SOLN injection Inject 60 mg into the skin once. Administer in upper arm, thigh, or abdomen 1 Syringe 0  . valsartan (DIOVAN) 80 MG tablet Take 80 mg by mouth every morning.      No current facility-administered medications on file prior to visit.     Allergies  Allergen Reactions  . Morphine Itching, Anxiety and Rash    IV only, made her mean    Objective: Physical Exam  General: Well developed, nourished, no acute distress, awake, alert and oriented x 3  Vascular: Dorsalis pedis artery 2/4 bilateral, Posterior tibial artery 2/4 bilateral, skin temperature warm to warm  proximal to distal bilateral lower extremities, + varicosities, pedal hair present bilateral.  Neurological: Gross sensation present via light touch bilateral.   Dermatological: Skin is warm, dry, and supple bilateral, Nails 1-10 are tender, short thick, and discolored with mild subungal debris, no webspace macerations present bilateral, no open lesions present bilateral, + hyperkeratotic tissue present sub-met 2 on right foot. No signs of infection  bilateral.  Musculoskeletal: Early right second hammertoe and dorsal bunion with limited range of motion at the right first metatarsophalangeal joint.  Muscular strength within normal limits without painon range of motion. No pain with calf compression bilateral.  X-rays right foot 3 views Normal osseous mineralization, there is first metatarsophalangeal joint space narrowing suggestive of early changes of arthritis. There is out elevation of the second toe and a long second metatarsal, there is posterior and inferior calcaneal spurs, there is no fracture, soft tissues within normal limits.  Assessment and Plan:  Problem List Items Addressed This Visit    None    Visit Diagnoses    Prominent metatarsal head of right foot    -  Primary   Relevant Orders   DG Foot Complete Right (Completed)   Porokeratosis       Dermatophytosis of nail       Relevant Orders   Fungus Culture with Smear   Right foot pain          -Examined patient -Discussed treatment options for callus. Patient elected this time to continue with self trimming and conservative care. I reviewed with patient her x-ray findings and advised long-term that patient may benefit from second metatarsal head resection versus shortening osteotomy and second hammertoe repair and cheilectomy at the first metatarsophalangeal joint on right. Patient will like to think about these options and to hold off on this until after Christmas. -Discussed treatment options for painful dystrophic nails  -Fungal culture was obtained and sent to [facility] lab -Patient to return in 4 weeks for follow up evaluation and discussion of fungal culture results or sooner if symptoms worsen.  Landis Martins, DPM

## 2016-08-29 MED FILL — DULoxetine HCL 60 MG CPEP: 60 | 90 days supply | Qty: 90 | Fill #2

## 2016-08-29 MED FILL — MONTELUKAST SOD 10 MG TAB: 10 | 90 days supply | Qty: 90 | Fill #1

## 2016-09-19 MED FILL — YUVAFEM 10 MCG TABS: 10 | 28 days supply | Qty: 8 | Fill #3

## 2016-09-25 ENCOUNTER — Ambulatory Visit (INDEPENDENT_AMBULATORY_CARE_PROVIDER_SITE_OTHER): Payer: 59 | Admitting: Sports Medicine

## 2016-09-25 ENCOUNTER — Encounter: Payer: Self-pay | Admitting: Sports Medicine

## 2016-09-25 DIAGNOSIS — B351 Tinea unguium: Secondary | ICD-10-CM

## 2016-09-25 LAB — HEPATIC FUNCTION PANEL
AG RATIO: 2 (calc) (ref 1.0–2.5)
ALT: 32 U/L — ABNORMAL HIGH (ref 6–29)
AST: 31 U/L (ref 10–35)
Albumin: 4.8 g/dL (ref 3.6–5.1)
Alkaline phosphatase (APISO): 69 U/L (ref 33–130)
Bilirubin, Direct: 0.1 mg/dL (ref 0.0–0.2)
GLOBULIN: 2.4 g/dL (ref 1.9–3.7)
Indirect Bilirubin: 0.4 mg/dL (calc) (ref 0.2–1.2)
Total Bilirubin: 0.5 mg/dL (ref 0.2–1.2)
Total Protein: 7.2 g/dL (ref 6.1–8.1)

## 2016-09-25 NOTE — Progress Notes (Signed)
Subjective: Sharon Daniels is a 66 y.o. female patient seen today in office for fungal culture results. Patient has no other pedal complaints at this time.   Patient Active Problem List   Diagnosis Date Noted  . Smoker 04/23/2016  . Breast hypertrophy 08/04/2015  . Osteoporosis 05/12/2015  . History of colonic polyps 04/21/2015  . DVT, lower extremity, distal, acute ([place]) 06/17/2013  . Ileus, postoperative ([place]) 06/17/2013  . 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 Hypokalemia 06/17/2013  . Acute pulmonary embolism ([place]) 06/13/2013  . Postoperative anemia due to acute blood loss 06/09/2013  . Transfusion history 06/09/2013  . OA (osteoarthritis) of knee 06/05/2013  . HIATAL HERNIA 06/18/2007  . FATTY LIVER DISEASE 06/18/2007    Current Outpatient Prescriptions on File Prior to Visit  Medication Sig Dispense Refill  . acetaminophen (TYLENOL) 500 MG tablet Take 500 mg by mouth every 6 (six) hours as needed.    Marland Kitchen albuterol (PROVENTIL HFA;VENTOLIN HFA) 108 (90 Base) MCG/ACT inhaler Inhale 2 puffs into the lungs every 4 (four) hours as needed for wheezing or shortness of breath (cough, shortness of breath or wheezing.). 1 Inhaler 1  . Cholecalciferol (VITAMIN D3) 1000 UNITS CAPS Take 10,000 Units by mouth daily.     . DULoxetine (CYMBALTA) 60 MG capsule Take 60 mg by mouth daily.    . Estradiol 10 MCG TABS vaginal tablet Place 1 tablet (10 mcg total) vaginally 2 (two) times a week. 8 tablet 11  . fluticasone (FLONASE) 50 MCG/ACT nasal spray Place 1 spray into both nostrils daily as needed (for seasonal allergies). Reported on 04/21/2015    . losartan (COZAAR) 25 MG tablet TAKE 1 TABLET  25 MG  BY MOUTH DAILY  2  . montelukast (SINGULAIR) 10 MG tablet Take 10 mg by mouth daily.    Marland Kitchen omeprazole (PRILOSEC) 40 MG capsule Take 40 mg by mouth daily as needed (for acid reflux or heartburn). Reported on 04/21/2015    . Propylene Glycol (SYSTANE BALANCE) 0.6 % SOLN Apply 1 drop to eye as  needed (Dry eyes). Reported on 04/21/2015    . rosuvastatin (CRESTOR) 5 MG tablet Take 5 mg by mouth daily.  3  . valsartan (DIOVAN) 80 MG tablet Take 80 mg by mouth every morning.     . denosumab (PROLIA) 60 MG/ML SOLN injection Inject 60 mg into the skin once. Administer in upper arm, thigh, or abdomen 1 Syringe 0   No current facility-administered medications on file prior to visit.     Allergies  Allergen Reactions  . Morphine Itching, Anxiety and Rash    IV only, made her mean    Objective: Physical Exam  General: Well developed, nourished, no acute distress, awake, alert and oriented x 3  Vascular: Dorsalis pedis artery 2/4 bilateral, Posterior tibial artery 2/4 bilateral, skin temperature warm to warm proximal to distal bilateral lower extremities, + varicosities, pedal hair present bilateral.  Neurological: Gross sensation present via light touch bilateral.   Dermatological: Skin is warm, dry, and supple bilateral, Nails 1-10 are tender, short thick, and discolored with mild subungal debris, no webspace macerations present bilateral, no open lesions present bilateral, + hyperkeratotic tissue present sub-met 2 on right foot. No signs of infection bilateral.  Musculoskeletal: Early right second hammertoe and dorsal bunion with limited range of motion at the right first metatarsophalangeal joint.  Muscular strength within normal limits without painon range of motion. No pain with calf compression bilateral.  Fungal culture + microtrauma + mold  Assessment and Plan:  Problem List Items Addressed This Visit    None    Visit Diagnoses    Dermatophytosis of nail    -  Primary   Relevant Orders   Hepatic Function Panel      -Examined patient -Discussed treatment options for painful mycotic nails -Patient opt for oral Itraconazole with full understanding of medication risks; ordered LFTs for review if within normal limits will proceed with sending Rx to pharmacy.  Anticipate 12 week course.  -Advised good hygiene habits -Patient to return in 6 weeks for follow up evaluation or sooner if symptoms worsen. Advised patient can consider foot surgery later once completed medication for nails.   Landis Martins, DPM

## 2016-09-26 ENCOUNTER — Telehealth: Payer: Self-pay | Admitting: *Deleted

## 2016-09-26 NOTE — Telephone Encounter (Addendum)
-----   Message from Landis Martins, [place] sent at 09/25/2016  5:24 PM EDT ----- Please let patient know that her blood work is elevated. Please have her return in 6 weeks for [place] to retest and determine if she will still be a good candidate for the oral medication She should already have an appointment. -Dr. Chauncey Cruel. 09/26/2016-Left message informing pt her results were available.09/28/2016-I informed pt of Dr. Leeanne Rio orders and that it may be an indicator of liver illness like hepatitis or a viral infection, and transferred pt to schedulers. Pt states she did not get the ALT results, leave a message. I left message informing pt the ALT normals 6-29, and her value was 32, and to call with questions.

## 2016-10-23 DIAGNOSIS — H3561 Retinal hemorrhage, right eye: Secondary | ICD-10-CM | POA: Diagnosis not present

## 2016-10-24 MED FILL — YUVAFEM 10 MCG TABS: 10 | 28 days supply | Qty: 8 | Fill #4

## 2016-11-06 ENCOUNTER — Ambulatory Visit (INDEPENDENT_AMBULATORY_CARE_PROVIDER_SITE_OTHER): Payer: 59 | Admitting: Sports Medicine

## 2016-11-06 DIAGNOSIS — M79671 Pain in right foot: Secondary | ICD-10-CM | POA: Diagnosis not present

## 2016-11-06 DIAGNOSIS — Q828 Other specified congenital malformations of skin: Secondary | ICD-10-CM

## 2016-11-06 DIAGNOSIS — B351 Tinea unguium: Secondary | ICD-10-CM | POA: Diagnosis not present

## 2016-11-06 DIAGNOSIS — M216X1 Other acquired deformities of right foot: Secondary | ICD-10-CM

## 2016-11-06 NOTE — Progress Notes (Signed)
Subjective: Sharon Daniels is a 66 y.o. female patient seen today in office for discussion of blood / lab results. Patient has not started on oral medication because of elevated LFTs. Patient states that hasn't been doing anything and nails are same. Admits to pain at callus at ball of right foot that she desires to have trimmed. No other pedal complaints at this time.   Patient Active Problem List   Diagnosis Date Noted  . Smoker 04/23/2016  . Breast hypertrophy 08/04/2015  . Osteoporosis 05/12/2015  . History of colonic polyps 04/21/2015  . DVT, lower extremity, distal, acute ([place]) 06/17/2013  . Ileus, postoperative ([place]) 06/17/2013  . 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 Hypokalemia 06/17/2013  . Acute pulmonary embolism ([place]) 06/13/2013  . Postoperative anemia due to acute blood loss 06/09/2013  . Transfusion history 06/09/2013  . OA (osteoarthritis) of knee 06/05/2013  . HIATAL HERNIA 06/18/2007  . FATTY LIVER DISEASE 06/18/2007    Current Outpatient Prescriptions on File Prior to Visit  Medication Sig Dispense Refill  . acetaminophen (TYLENOL) 500 MG tablet Take 500 mg by mouth every 6 (six) hours as needed.    Marland Kitchen albuterol (PROVENTIL HFA;VENTOLIN HFA) 108 (90 Base) MCG/ACT inhaler Inhale 2 puffs into the lungs every 4 (four) hours as needed for wheezing or shortness of breath (cough, shortness of breath or wheezing.). 1 Inhaler 1  . Cholecalciferol (VITAMIN D3) 1000 UNITS CAPS Take 10,000 Units by mouth daily.     Marland Kitchen denosumab (PROLIA) 60 MG/ML SOLN injection Inject 60 mg into the skin once. Administer in upper arm, thigh, or abdomen 1 Syringe 0  . DULoxetine (CYMBALTA) 60 MG capsule Take 60 mg by mouth daily.    . Estradiol 10 MCG TABS vaginal tablet Place 1 tablet (10 mcg total) vaginally 2 (two) times a week. 8 tablet 11  . fluticasone (FLONASE) 50 MCG/ACT nasal spray Place 1 spray into both nostrils daily as needed (for seasonal allergies). Reported on 04/21/2015     . losartan (COZAAR) 25 MG tablet TAKE 1 TABLET  25 MG  BY MOUTH DAILY  2  . montelukast (SINGULAIR) 10 MG tablet Take 10 mg by mouth daily.    Marland Kitchen omeprazole (PRILOSEC) 40 MG capsule Take 40 mg by mouth daily as needed (for acid reflux or heartburn). Reported on 04/21/2015    . Propylene Glycol (SYSTANE BALANCE) 0.6 % SOLN Apply 1 drop to eye as needed (Dry eyes). Reported on 04/21/2015    . rosuvastatin (CRESTOR) 5 MG tablet Take 5 mg by mouth daily.  3  . valsartan (DIOVAN) 80 MG tablet Take 80 mg by mouth every morning.      No current facility-administered medications on file prior to visit.     Allergies  Allergen Reactions  . Morphine Itching, Anxiety and Rash    IV only, made her mean    Objective: Physical Exam  General: Well developed, nourished, no acute distress, awake, alert and oriented x 3  Vascular: Dorsalis pedis artery 2/4 bilateral, Posterior tibial artery 2/4 bilateral, skin temperature warm to warm proximal to distal bilateral lower extremities, + varicosities, pedal hair present bilateral.  Neurological: Gross sensation present via light touch bilateral.   Dermatological: Skin is warm, dry, and supple bilateral, Nails 1-10 are tender, short thick, and discolored with mild subungal debris, no webspace macerations present bilateral, no open lesions present bilateral, + hyperkeratotic tissue present sub-met 2 on right foot. No signs of infection bilateral.  Musculoskeletal: Early right second hammertoe  and dorsal bunion with limited range of motion at the right first metatarsophalangeal joint.  Muscular strength within normal limits without painon range of motion. No pain with calf compression bilateral.  Fungal culture + microtrauma + mold Elevated LFTs   Assessment and Plan:  Problem List Items Addressed This Visit    None    Visit Diagnoses    Dermatophytosis of nail    -  Primary   Relevant Orders   Hepatic Function Panel   Porokeratosis        Prominent metatarsal head of right foot       Right foot pain         -Examined patient -Complimentary Mechanically debrided callus sub met 2 on right using sterile chisel blade without incident and recommend good offloading/padding  -Re-Discussed treatment options for painful mycotic nails -A new set of LFTs were ordered if normal will start patient on Itraconazole -Advised good hygiene habits -Patient to return after blood work is completed. Advised patient can consider foot surgery later once completed medication for nails.   Landis Martins, DPM

## 2016-11-08 MED FILL — LOSARTAN POTASSIUM 25 MG TA: 25 | 90 days supply | Qty: 90 | Fill #1

## 2016-11-12 ENCOUNTER — Telehealth: Payer: Self-pay | Admitting: Gynecology

## 2016-11-12 DIAGNOSIS — M810 Age-related osteoporosis without current pathological fracture: Secondary | ICD-10-CM

## 2016-11-23 NOTE — Telephone Encounter (Signed)
Prolia due 11/19/16  , Calcium 2017. Completel JF04/18, MD Changed to Dr Phineas Real.  Needs repeat Calcium level. Schedule 11/26/16 . Needs RX called to [place]. One syring Prolia .  [facility] ending 12/07/16.  [facility] called in [facility] [place] . Not needed 04/18 they will confirm. I will ok Prolia  With Dr Phineas Real also.

## 2016-11-26 ENCOUNTER — Other Ambulatory Visit: Payer: 59

## 2016-11-26 DIAGNOSIS — M810 Age-related osteoporosis without current pathological fracture: Secondary | ICD-10-CM | POA: Diagnosis not present

## 2016-11-26 LAB — CALCIUM: Calcium: 10.3 mg/dL (ref 8.6–10.4)

## 2016-11-26 MED FILL — YUVAFEM 10 MCG VAGINAL INSE: 10 | 28 days supply | Qty: 8 | Fill #5

## 2016-11-26 NOTE — Telephone Encounter (Signed)
Okay 

## 2016-11-27 MED FILL — PROLIA 60 MG/ML SOLN: 60 | 30 days supply | Qty: 1 | Fill #0

## 2016-11-28 ENCOUNTER — Encounter: Payer: Self-pay | Admitting: Gynecology

## 2016-12-03 ENCOUNTER — Encounter: Payer: Self-pay | Admitting: Gynecology

## 2016-12-03 NOTE — Telephone Encounter (Signed)
PA approval ref # [id number]  Valid for 11/27/16 thru 11/26/17  for Prolia received by [place]  Talked with Jinny Blossom at [facility] ordered and co pay $25 . Pt scheduled for Friday 2:30. Calcium 10.3  11/26/16. Pt will have new insurance starting Medicare.

## 2016-12-05 DIAGNOSIS — B351 Tinea unguium: Secondary | ICD-10-CM | POA: Diagnosis not present

## 2016-12-06 LAB — HEPATIC FUNCTION PANEL
AG RATIO: 1.6 (calc) (ref 1.0–2.5)
ALBUMIN MSPROF: 4.3 g/dL (ref 3.6–5.1)
ALT: 35 U/L — ABNORMAL HIGH (ref 6–29)
AST: 26 U/L (ref 10–35)
Alkaline phosphatase (APISO): 70 U/L (ref 33–130)
BILIRUBIN DIRECT: 0 mg/dL (ref 0.0–0.2)
BILIRUBIN TOTAL: 0.3 mg/dL (ref 0.2–1.2)
Globulin: 2.7 g/dL (calc) (ref 1.9–3.7)
Indirect Bilirubin: 0.3 mg/dL (calc) (ref 0.2–1.2)
Total Protein: 7 g/dL (ref 6.1–8.1)

## 2016-12-07 ENCOUNTER — Telehealth: Payer: Self-pay | Admitting: *Deleted

## 2016-12-07 ENCOUNTER — Ambulatory Visit (INDEPENDENT_AMBULATORY_CARE_PROVIDER_SITE_OTHER): Payer: 59 | Admitting: Gynecology

## 2016-12-07 DIAGNOSIS — M810 Age-related osteoporosis without current pathological fracture: Secondary | ICD-10-CM

## 2016-12-07 MED ORDER — DENOSUMAB 60 MG/ML ~~LOC~~ SOLN
60.0000 mg | Freq: Once | SUBCUTANEOUS | Status: AC
  Administered 2016-12-07: 60 mg via SUBCUTANEOUS

## 2016-12-07 NOTE — Telephone Encounter (Signed)
I informed pt of Dr. Leeanne Rio review of results and recommendations. Pt states understanding and she sees Dr. Rachell Cipro. Lattie Haw - Dr. Ival Bible office staff states fax results to [phone]. Faxed 12/05/2016 hepatic function labs to Dr. Ernie Hew.

## 2016-12-07 NOTE — Telephone Encounter (Signed)
-----   Message from Landis Martins, [place] sent at 12/06/2016  7:43 AM EST ----- Can you let patient know that her ALT liver enzyme is still elevated. Have patient to discuss this with her PCP to see if we can figure out why? And to see if her PCP thinks it will be safe to start Itraconazole Thanks Dr. Cannon Kettle

## 2016-12-10 NOTE — Telephone Encounter (Addendum)
Prolia given 12/07/16 next injection 06/07/17

## 2016-12-12 MED FILL — DULoxetine HCL 60 MG CPEP: 60 | 30 days supply | Qty: 30 | Fill #0

## 2016-12-13 MED FILL — MONTELUKAST SOD 10 MG TAB: 10 | 90 days supply | Qty: 90 | Fill #2

## 2016-12-29 ENCOUNTER — Encounter (HOSPITAL_COMMUNITY): Payer: Self-pay | Admitting: Emergency Medicine

## 2016-12-29 ENCOUNTER — Other Ambulatory Visit: Payer: Self-pay

## 2016-12-29 ENCOUNTER — Ambulatory Visit (HOSPITAL_COMMUNITY)
Admission: EM | Admit: 2016-12-29 | Discharge: 2016-12-29 | Disposition: A | Discharge status: Home / Self Care | Payer: Medicare Other | Attending: Family Medicine | Admitting: Family Medicine

## 2016-12-29 DIAGNOSIS — J0100 Acute maxillary sinusitis, unspecified: Secondary | ICD-10-CM | POA: Diagnosis not present

## 2016-12-29 MED ORDER — AMOXICILLIN-POT CLAVULANATE 875-125 MG PO TABS: 1.0000 | ORAL_TABLET | Freq: Two times a day (BID) | ORAL | 0 refills | Status: DC

## 2016-12-29 MED ORDER — FLUCONAZOLE 200 MG PO TABS: 200.0000 mg | ORAL_TABLET | Freq: Once | ORAL | 0 refills | Status: DC

## 2016-12-29 MED ORDER — FLUCONAZOLE 200 MG PO TABS: 200.0000 mg | ORAL_TABLET | Freq: Once | ORAL | 0 refills | Status: AC

## 2016-12-29 MED ORDER — AMOXICILLIN-POT CLAVULANATE 875-125 MG PO TABS: 1.0000 | ORAL_TABLET | Freq: Two times a day (BID) | ORAL | 0 refills | Status: AC

## 2016-12-29 NOTE — Discharge Instructions (Signed)
Push fluids to ensure adequate hydration and keep secretions thin.  Tylenol and/or ibuprofen as needed for pain or fevers.  Mucinex, nasal spray, inhaler as needed for symptoms. If symptoms worsen or do not improve in the next week to return to be seen or to follow up with PCP.

## 2016-12-29 NOTE — ED Triage Notes (Signed)
Pt reports bilateral eye drainage, sinus pressure and pain, and cough x1 week.

## 2016-12-29 NOTE — ED Provider Notes (Signed)
[place]    CSN: [id number] Arrival date & time: 12/29/16  1305     History   Chief Complaint Chief Complaint  Patient presents with  . URI    HPI Sharon Daniels is a 66 y.o. female.   Zakiya presents with complaints of sinus pressure, eye drainage, bilateral ear pain, non productive cough, post nasal drip, night sweats which have been ongoing for greater than a week. Has a history of sinus surgery approximately 15 years ago, last year had first sinus infection in a long time. rates pain 5/10. Has general back ache, it is dull. Has taken tylenol, mucinex and used her prn inhaler which have helped some with symptoms. Mild sore throat. Without rash. No known ill contacts. Without gi/gu complaints.    ROS per HPI.       Past Medical History:  Diagnosis Date  . Acute pulmonary embolism ([place]) 06/13/2013   CT angio chest 06/12/13  . Allergy   . Anemia   . Arthritis   . Complication of anesthesia    "during breast surgery: could hear the surgery, wake up too soon, anxious  . Depression    hx of  . H/O bronchitis   . Hard of hearing   . History of kidney stones    x3  . Hypertension   . Osteoporosis   . Pneumonia    hx of 10 years ago  . PONV (postoperative nausea and vomiting)    "mild nausea"  . Stress incontinence   . Substance abuse ([place])   . Wears glasses     Patient Active Problem List   Diagnosis Date Noted  . Smoker 04/23/2016  . Breast hypertrophy 08/04/2015  . Osteoporosis 05/12/2015  . History of colonic polyps 04/21/2015  . DVT, lower extremity, distal, acute ([place]) 06/17/2013  . Ileus, postoperative ([place]) 06/17/2013  . 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 Hypokalemia 06/17/2013  . Acute pulmonary embolism ([place]) 06/13/2013  . Postoperative anemia due to acute blood loss 06/09/2013  . Transfusion history 06/09/2013  . OA (osteoarthritis) of knee 06/05/2013  . HIATAL HERNIA 06/18/2007  . FATTY LIVER DISEASE 06/18/2007     Past Surgical History:  Procedure Laterality Date  . ABDOMINOPLASTY  09/2015  . BREAST REDUCTION SURGERY  08/04/2015  . BREAST REDUCTION SURGERY Bilateral 08/04/2015   Procedure: MAMMARY REDUCTION  (BREAST);  Surgeon: Crissie Reese, MD;  Location: [facility];  Service: Plastics;  Laterality: Bilateral;  . BREAST SURGERY  1985   abscess  . CARPAL TUNNEL RELEASE Left   . [place]  . COLONOSCOPY    . COSMETIC SURGERY  2013   in the office  . COSMETIC SURGERY     ear lobes shorten  . NASAL SINUS SURGERY  2000  . TOTAL KNEE ARTHROPLASTY Bilateral 06/05/2013   Procedure: BILATERAL TOTAL KNEE ARTHROPLASTY;  Surgeon: Gearlean Alf, MD;  Location: [facility];  Service: Orthopedics;  Laterality: Bilateral;    OB History    Gravida Para Term Preterm AB Living   3 2     1 2    SAB TAB Ectopic Multiple Live Births   1               Home Medications    Prior to Admission medications   Medication Sig Start Date End Date Taking? Authorizing Provider  albuterol (PROVENTIL HFA;VENTOLIN HFA) 108 (90 Base) MCG/ACT inhaler Inhale 2 puffs into the lungs every 4 (four) hours as needed for  wheezing or shortness of breath (cough, shortness of breath or wheezing.). 10/19/15  Yes Scot Jun, FNP  Cholecalciferol (VITAMIN D3) 1000 UNITS CAPS Take 10,000 Units by mouth daily.  10/29/11  Yes [provider]  DULoxetine (CYMBALTA) 60 MG capsule Take 60 mg by mouth daily.   Yes [provider]  fluticasone (FLONASE) 50 MCG/ACT nasal spray Place 1 spray into both nostrils daily as needed (for seasonal allergies). Reported on 04/21/2015 01/16/13  Yes [provider]  losartan (COZAAR) 25 MG tablet TAKE 1 TABLET  25 MG  BY MOUTH DAILY 07/25/16  Yes [provider]  montelukast (SINGULAIR) 10 MG tablet Take 10 mg by mouth daily.   Yes [provider]  omeprazole (PRILOSEC) 40 MG capsule Take 40 mg by mouth daily as needed (for acid reflux or  heartburn). Reported on 04/21/2015   Yes [provider]  valsartan (DIOVAN) 80 MG tablet Take 80 mg by mouth every morning.    Yes [provider]  acetaminophen (TYLENOL) 500 MG tablet Take 500 mg by mouth every 6 (six) hours as needed.    [provider]  amoxicillin-clavulanate (AUGMENTIN) 875-125 MG tablet Take 1 tablet by mouth every 12 (twelve) hours for 7 days. 12/29/16 01/05/17  Zigmund Gottron, NP  denosumab (PROLIA) 60 MG/ML SOLN injection Inject 60 mg into the skin once. Administer in upper arm, thigh, or abdomen 04/24/16 04/24/16  Terrance Mass, MD  Estradiol 10 MCG TABS vaginal tablet Place 1 tablet (10 mcg total) vaginally 2 (two) times a week. 04/23/16   Terrance Mass, MD  fluconazole (DIFLUCAN) 200 MG tablet Take 1 tablet (200 mg total) by mouth once for 1 dose. At completion of antibiotics 12/29/16 12/29/16  Zigmund Gottron, NP  Propylene Glycol (SYSTANE BALANCE) 0.6 % SOLN Apply 1 drop to eye as needed (Dry eyes). Reported on 04/21/2015    [provider]  rosuvastatin (CRESTOR) 5 MG tablet Take 5 mg by mouth daily. 06/12/16   [provider]    Family History Family History  Problem Relation Age of Onset  . Kidney disease Mother   . Atrial fibrillation Mother   . Breast cancer Mother   . Ovarian cancer Mother   . Heart attack Father   . Colon cancer Neg Hx     Social History Social History   Tobacco Use  . Smoking status: Current Every Day Smoker    Packs/day: 1.00    Years: 30.00    Pack years: 30.00    Types: Cigarettes    Last attempt to quit: 06/20/2015    Years since quitting: 1.5  . Smokeless tobacco: Never Used  Substance Use Topics  . Alcohol use: No    Alcohol/week: 0.0 oz    Comment: quit 01/25/1994   . Drug use: No    Comment: hx of cocaine, none 1982     Allergies   Morphine   Review of Systems Review of Systems   Physical Exam Triage Vital Signs ED Triage Vitals [12/29/16 1352]  Enc  Vitals Group     BP (!) 156/76     Pulse Rate 82     Resp      Temp 98.6 F (37 C)     Temp Source Oral     SpO2 98 %     Weight      Height      Head Circumference      Peak Flow  Pain Score 7     Pain Loc      Pain Edu?      Excl. in [place]?    No data found.  Updated Vital Signs BP (!) 156/76 (BP Location: Left Arm)   Pulse 82   Temp 98.6 F (37 C) (Oral)   SpO2 98%   Visual Acuity Right Eye Distance:   Left Eye Distance:   Bilateral Distance:    Right Eye Near:   Left Eye Near:    Bilateral Near:     Physical Exam  Constitutional: She is oriented to person, place, and time. She appears well-developed and well-nourished. No distress.  HENT:  Head: Normocephalic and atraumatic.  Right Ear: Tympanic membrane, external ear and ear canal normal.  Left Ear: Tympanic membrane, external ear and ear canal normal.  Nose: Mucosal edema present. Right sinus exhibits maxillary sinus tenderness. Left sinus exhibits maxillary sinus tenderness.  Mouth/Throat: Uvula is midline, oropharynx is clear and moist and mucous membranes are normal. No tonsillar exudate.  Eyes: Conjunctivae and EOM are normal. Pupils are equal, round, and reactive to light.  Cardiovascular: Normal rate, regular rhythm and normal heart sounds.  Pulmonary/Chest: Effort normal and breath sounds normal.  Neurological: She is alert and oriented to person, place, and time.  Skin: Skin is warm and dry.     UC Treatments / Results  Labs (all labs ordered are listed, but only abnormal results are displayed) Labs Reviewed - No data to display  EKG  EKG Interpretation None       Radiology No results found.  Procedures Procedures (including critical care time)  Medications Ordered in UC Medications - No data to display   Initial Impression / Assessment and Plan / UC Course  I have reviewed the triage vital signs and the nursing notes.  Pertinent labs & imaging results that were available  during my care of the patient were reviewed by me and considered in my medical decision making (see chart for details).     Complete course of antibiotics. May use diflucan as needed for yeast infection following. Continue with prescribed medications, flonase and prn albuterol. Push fluids to ensure adequate hydration and keep secretions thin. Tylenol and/or ibuprofen as needed for pain or fevers.  If symptoms worsen or do not improve in the next week to return to be seen or to follow up with PCP.  Patient verbalized understanding and agreeable to plan.    Final Clinical Impressions(s) / UC Diagnoses   Final diagnoses:  Acute maxillary sinusitis, recurrence not specified    ED Discharge Orders        Ordered    amoxicillin-clavulanate (AUGMENTIN) 875-125 MG tablet  Every 12 hours,   Status:  Discontinued     12/29/16 1441    fluconazole (DIFLUCAN) 200 MG tablet   Once,   Status:  Discontinued     12/29/16 1441    amoxicillin-clavulanate (AUGMENTIN) 875-125 MG tablet  Every 12 hours     12/29/16 1442    fluconazole (DIFLUCAN) 200 MG tablet   Once     12/29/16 1442       Controlled Substance Prescriptions [place] Controlled Substance Registry consulted? Not Applicable   Zigmund Gottron, NP 12/29/16 1446

## 2017-01-04 ENCOUNTER — Other Ambulatory Visit: Payer: Self-pay | Admitting: Family Medicine

## 2017-01-04 ENCOUNTER — Ambulatory Visit
Admission: RE | Admit: 2017-01-04 | Discharge: 2017-01-04 | Disposition: A | Discharge status: Home / Self Care | Payer: Medicare Other | Source: Ambulatory Visit | Attending: Family Medicine | Admitting: Family Medicine

## 2017-01-04 DIAGNOSIS — R05 Cough: Secondary | ICD-10-CM

## 2017-01-04 DIAGNOSIS — R079 Chest pain, unspecified: Secondary | ICD-10-CM

## 2017-01-04 DIAGNOSIS — Z6831 Body mass index (BMI) 31.0-31.9, adult: Secondary | ICD-10-CM | POA: Diagnosis not present

## 2017-01-04 DIAGNOSIS — R059 Cough, unspecified: Secondary | ICD-10-CM

## 2017-01-04 DIAGNOSIS — J069 Acute upper respiratory infection, unspecified: Secondary | ICD-10-CM | POA: Diagnosis not present

## 2017-01-21 MED FILL — DULoxetine HCL 60 MG CPEP: 60 | 30 days supply | Qty: 30 | Fill #1

## 2017-01-21 MED FILL — ROSUVASTATIN CALCIUM 5 MG T: 5 | 90 days supply | Qty: 90 | Fill #2

## 2017-01-21 MED FILL — LOSARTAN POTASSIUM 25 MG TA: 25 | 90 days supply | Qty: 90 | Fill #2

## 2017-01-21 MED FILL — YUVAFEM 10 MCG VAGINAL INSE: 10 | 28 days supply | Qty: 8 | Fill #6

## 2017-02-20 MED FILL — YUVAFEM 10 MCG VAGINAL INSE: 10 | 28 days supply | Qty: 8 | Fill #7

## 2017-02-20 MED FILL — DULoxetine HCL 60 MG CPEP: 60 | 30 days supply | Qty: 30 | Fill #2

## 2017-03-21 MED FILL — YUVAFEM 10 MCG VAGINAL INSE: 10 | 28 days supply | Qty: 8 | Fill #8

## 2017-03-26 MED FILL — DULoxetine HCL 60 MG CPEP: 60 | 30 days supply | Qty: 30 | Fill #3

## 2017-04-18 ENCOUNTER — Telehealth: Payer: Self-pay | Admitting: *Deleted

## 2017-04-18 NOTE — Telephone Encounter (Addendum)
Prolia insurance verification has been sent awaiting Summary of benefits  Deductible amount met Amount met  OOP MAX $5800 (145mt)  Annual exam 04/24/17 TF  Calcium     10.3        Date 11/26/16  Upcoming dental procedures NO  Prior Authorization needed YES per SOB [phone] when I called to get PA started representative said the JCode does not need PA  reference # [id number]  Pt estimated Cost $213    Coverage Details:20 % off 12194 appt 06/11/17 at 11:00am

## 2017-04-24 ENCOUNTER — Ambulatory Visit: Payer: Medicare Other | Admitting: Gynecology

## 2017-04-24 ENCOUNTER — Encounter: Payer: Self-pay | Admitting: Gynecology

## 2017-04-24 VITALS — BP 124/80 | Ht 68.0 in | Wt 203.0 lb

## 2017-04-24 DIAGNOSIS — Z779 Other contact with and (suspected) exposures hazardous to health: Secondary | ICD-10-CM

## 2017-04-24 DIAGNOSIS — Z9189 Other specified personal risk factors, not elsewhere classified: Secondary | ICD-10-CM | POA: Diagnosis not present

## 2017-04-24 DIAGNOSIS — Z01411 Encounter for gynecological examination (general) (routine) with abnormal findings: Secondary | ICD-10-CM

## 2017-04-24 DIAGNOSIS — N952 Postmenopausal atrophic vaginitis: Secondary | ICD-10-CM

## 2017-04-24 DIAGNOSIS — M810 Age-related osteoporosis without current pathological fracture: Secondary | ICD-10-CM

## 2017-04-24 DIAGNOSIS — N898 Other specified noninflammatory disorders of vagina: Secondary | ICD-10-CM | POA: Diagnosis not present

## 2017-04-24 LAB — WET PREP FOR TRICH, YEAST, CLUE

## 2017-04-24 MED ORDER — ESTRADIOL 10 MCG VA TABS: 1.0000 | ORAL_TABLET | VAGINAL | 12 refills | Status: DC

## 2017-04-24 MED ORDER — CLINDAMYCIN PHOSPHATE 2 % VA CREA: 1.0000 | TOPICAL_CREAM | Freq: Every day | VAGINAL | 0 refills | Status: DC

## 2017-04-24 NOTE — Progress Notes (Signed)
Sharon Daniels Oct 16, 1950 [id number]        66 y.o.  [id number] for breast and pelvic exam.  Former patient of Dr. Toney Rakes with a number of issues noted below.  Past medical history,surgical history, problem list, medications, allergies, family history and social history were all reviewed and documented as reviewed in the EPIC chart.  ROS:  Performed with pertinent positives and negatives included in the history, assessment and plan.   Additional significant findings : None   Exam: Sharon Daniels assistant Vitals:   04/24/17 1409  BP: 124/80  Weight: 203 lb (92.1 kg)  Height: 5\' 8"  (1.727 m)   Body mass index is 30.87 kg/m.  General appearance:  Normal affect, orientation and appearance. Skin: Grossly normal HEENT: Without gross lesions.  No cervical or supraclavicular adenopathy. Thyroid normal.  Lungs:  Clear without wheezing, rales or rhonchi Cardiac: RR, without RMG Abdominal:  Soft, nontender, without masses, guarding, rebound, organomegaly or hernia Breasts:  Examined lying and sitting without masses, retractions, discharge or axillary adenopathy.  Well-healed bilateral reduction scars Pelvic:  Ext, BUS, Vagina: Normal with atrophic changes.  Cervix: Normal with atrophic changes  Uterus: Grossly normal size midline mobile nontender  Adnexa: Without masses or tenderness    Anus and perineum: Normal   Rectovaginal: Normal sphincter tone without palpated masses or tenderness.    Assessment/Plan:  67 y.o. [id number] female for breast and pelvic exam.   1. Postmenopausal/atrophic genital changes.  Using Vagifem 10 mcg twice weekly.  Started due to vaginal dryness and irritation which has resolved.  Still has some vaginal odor and she was wondering whether this was related.  We discussed that probably not as discussed and #2.  She wants to continue on the Vagifem because of relief of her vaginal symptoms.  Otherwise doing well without significant hot flushes or sweats.  I  discussed the issues of absorption and systemic effects such as thrombosis, breast and endometrial stimulation.  She is comfortable continuing and I refilled her times 1 year.  She knows to call and report any vaginal bleeding. 2. Vaginal odor.  On and off over the last several years.  Thought it was due to lack of estrogen but still having some symptoms.  Exam is unremarkable.  Wet prep is negative.  We discussed various possibilities.  I am going to treat her with a course for bacterial vaginosis to see if this does not improve her symptoms.  Cleocin vaginal cream nightly x7 nights.  Patient will let me know if her symptoms continue. 3. Osteoporosis.  DEXA 2017 T score -2.7.  Was started on Prolia by Dr. Toney Rakes and has done well with this.  We will continue this year and I recommended she repeat her bone density now whenever she does her mammogram as it is being done at [facility] and she will call and let them know this and arrange for this.  We discussed the risks of Prolia to include osteonecrosis of the jaw and atypical fractures rashes and infections.  This balanced against her risks of fracture not only now but in the future if untreated and it progresses.  Patient is comfortable continuing and will plan on doing so she knows she is coming due for her shot. 4. Mammography coming due now she is going to call at [facility] to arrange along with her DEXA.  Breast exam normal today. 5. Pap smear 2017.  No Pap smear done today.  No history of abnormal Pap smears.  Reviewed  current screening guidelines and options to stop screening versus less frequent screening intervals reviewed.  Will plan follow-up Pap smear next year at 3-year interval and then will go from there. 6. Colonoscopy 2016.  Repeat at their recommended interval. 7. Health maintenance.  No routine lab work done as patient does this elsewhere.  Follow-up 1 year, sooner as needed.  Additional time in excess of her breast and pelvic exam was spent in  direct face to face counseling and coordination of care in regards to her vaginal odor with prescription provided.Sharon Auerbach MD, 3:50 PM 04/24/2017

## 2017-04-24 NOTE — Patient Instructions (Signed)
Use the vaginal cream nightly x7 nights to see if this does not eliminate the vaginal odor.  Follow-up for the Prolia injection as arranged by the office.  If you do not hear from the office within the next several weeks call.  Arrange for your mammogram and bone density at [facility] as you are coming due.

## 2017-04-29 MED FILL — MONTELUKAST SOD 10 MG TAB: 10 | 90 days supply | Qty: 90 | Fill #3

## 2017-04-29 MED FILL — DULoxetine HCL 60 MG CPEP: 60 | 30 days supply | Qty: 30 | Fill #4

## 2017-05-28 MED FILL — DULoxetine HCL 60 MG CPEP: 60 | 30 days supply | Qty: 30 | Fill #5

## 2017-05-31 MED FILL — DULoxetine HCL 30 MG CPEP: 30 | 30 days supply | Qty: 30 | Fill #0

## 2017-06-11 ENCOUNTER — Ambulatory Visit: Payer: Medicare Other | Admitting: Gynecology

## 2017-06-11 DIAGNOSIS — M810 Age-related osteoporosis without current pathological fracture: Secondary | ICD-10-CM

## 2017-06-11 MED ORDER — DENOSUMAB 60 MG/ML ~~LOC~~ SOSY
60.0000 mg | PREFILLED_SYRINGE | Freq: Once | SUBCUTANEOUS | Status: AC
  Administered 2017-06-11: 60 mg via SUBCUTANEOUS

## 2017-06-12 ENCOUNTER — Ambulatory Visit (HOSPITAL_COMMUNITY): Payer: Medicare Other | Admitting: Psychiatry

## 2017-06-20 ENCOUNTER — Encounter: Payer: Self-pay | Admitting: Gynecology

## 2017-06-20 NOTE — Telephone Encounter (Signed)
Prolia Given  06/11/17 Next injection 12/12/17

## 2017-07-01 MED FILL — LOSARTAN POTASSIUM 25 MG TA: 25 | 90 days supply | Qty: 90 | Fill #0

## 2017-07-01 MED FILL — ROSUVASTATIN CALCIUM 5 MG T: 5 | 90 days supply | Qty: 90 | Fill #0

## 2017-07-01 MED FILL — DULoxetine HCL 30 MG CPEP: 30 | 30 days supply | Qty: 30 | Fill #1

## 2017-07-01 MED FILL — ESTRADIOL 10 MCG TABS: 10 | 28 days supply | Qty: 8 | Fill #0

## 2017-07-02 ENCOUNTER — Ambulatory Visit: Payer: Medicare Other | Admitting: Sports Medicine

## 2017-07-02 ENCOUNTER — Encounter: Payer: Self-pay | Admitting: Sports Medicine

## 2017-07-02 VITALS — BP 137/78 | HR 67

## 2017-07-02 DIAGNOSIS — M216X1 Other acquired deformities of right foot: Secondary | ICD-10-CM | POA: Diagnosis not present

## 2017-07-02 DIAGNOSIS — M79671 Pain in right foot: Secondary | ICD-10-CM | POA: Diagnosis not present

## 2017-07-02 DIAGNOSIS — M2041 Other hammer toe(s) (acquired), right foot: Secondary | ICD-10-CM | POA: Diagnosis not present

## 2017-07-02 DIAGNOSIS — Q828 Other specified congenital malformations of skin: Secondary | ICD-10-CM | POA: Diagnosis not present

## 2017-07-02 NOTE — Progress Notes (Signed)
Subjective: Sharon Daniels is a 67 y.o. female patient seen today in office for  pain at callus at ball of right foot and wants to re-discuss possible surgery for future. No other pedal complaints at this time.   Review of Systems  Musculoskeletal: Positive for joint pain.  All other systems reviewed and are negative.   Patient Active Problem List   Diagnosis Date Noted  . Smoker 04/23/2016  . Breast hypertrophy 08/04/2015  . Osteoporosis 05/12/2015  . History of colonic polyps 04/21/2015  . DVT, lower extremity, distal, acute ([place]) 06/17/2013  . Ileus, postoperative ([place]) 06/17/2013  . 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 Hypokalemia 06/17/2013  . Acute pulmonary embolism ([place]) 06/13/2013  . Postoperative anemia due to acute blood loss 06/09/2013  . Transfusion history 06/09/2013  . OA (osteoarthritis) of knee 06/05/2013  . HIATAL HERNIA 06/18/2007  . FATTY LIVER DISEASE 06/18/2007    Current Outpatient Medications on File Prior to Visit  Medication Sig Dispense Refill  . Cholecalciferol (VITAMIN D3) 1000 UNITS CAPS Take 10,000 Units by mouth daily.     . DULoxetine (CYMBALTA) 30 MG capsule Take 30 mg by mouth daily.  1  . Estradiol 10 MCG TABS vaginal tablet Place 1 tablet (10 mcg total) vaginally 2 (two) times a week. 8 tablet 12  . fluticasone (FLONASE) 50 MCG/ACT nasal spray Place 1 spray into both nostrils daily as needed (for seasonal allergies). Reported on 04/21/2015    . losartan (COZAAR) 25 MG tablet TAKE 1 TABLET  25 MG  BY MOUTH DAILY  2  . montelukast (SINGULAIR) 10 MG tablet Take 10 mg by mouth daily.    Marland Kitchen omeprazole (PRILOSEC) 40 MG capsule Take 40 mg by mouth daily as needed (for acid reflux or heartburn). Reported on 04/21/2015    . Propylene Glycol (SYSTANE BALANCE) 0.6 % SOLN Apply 1 drop to eye as needed (Dry eyes). Reported on 04/21/2015    . rosuvastatin (CRESTOR) 5 MG tablet Take 5 mg by mouth daily.  3  . valsartan (DIOVAN) 80 MG tablet Take 80  mg by mouth every morning.     . denosumab (PROLIA) 60 MG/ML SOLN injection Inject 60 mg into the skin once. Administer in upper arm, thigh, or abdomen 1 Syringe 0   No current facility-administered medications on file prior to visit.     Allergies  Allergen Reactions  . Morphine And Related Itching    Other reaction(s): ITCHING   . Morphine Itching, Anxiety and Rash    IV only, made her mean    Objective: Physical Exam  General: Well developed, nourished, no acute distress, awake, alert and oriented x 3  Vascular: Dorsalis pedis artery 2/4 bilateral, Posterior tibial artery 1/4 bilateral, skin temperature warm to warm proximal to distal bilateral lower extremities, + varicosities, pedal hair present bilateral.  Neurological: Gross sensation present via light touch bilateral.   Dermatological: Skin is warm, dry, and supple bilateral, Nails 1-10 are short thick, and discolored with mild subungal debris, no webspace macerations present bilateral, no open lesions present bilateral, + hyperkeratotic tissue present sub-met 2 on right foot. No signs of infection bilateral.  Musculoskeletal: Early right second hammertoe and dorsal bunion with limited range of motion at the right first metatarsophalangeal joint.  Muscular strength within normal limits without painon range of motion. No pain with calf compression bilateral.  Assessment and Plan:  Problem List Items Addressed This Visit    None    Visit Diagnoses  Porokeratosis    -  Primary   Prominent metatarsal head of right foot       Right foot pain       Hammertoe of right foot         -Examined patient -Complimentary Mechanically debrided callus sub met 2 on right using sterile chisel blade without incident and recommend good offloading/padding  -Re-Discussed treatment options for painful callus with early hammertoe and plantarflexed 2nd met and 1st MTPJ arthritis -Recommend good supportive shoes daily -Return when ready  for surgery consult.    Landis Martins, DPM

## 2017-07-24 ENCOUNTER — Encounter: Payer: Self-pay | Admitting: Gynecology

## 2017-07-24 MED FILL — DULoxetine HCL 30 MG CPEP: 30 | 90 days supply | Qty: 90 | Fill #0

## 2017-07-26 MED FILL — MONTELUKAST SOD 10 MG TAB: 10 | 90 days supply | Qty: 90 | Fill #0

## 2017-08-09 MED FILL — DULoxetine HCL 60 MG CPEP: 60 | 90 days supply | Qty: 90 | Fill #0

## 2017-08-09 MED FILL — LORazepam 0.5 MG TABS: 0.5 | 7 days supply | Qty: 20 | Fill #0

## 2017-08-09 MED FILL — FOLIC ACID 1 MG TABS: 1 | 30 days supply | Qty: 30 | Fill #0

## 2017-08-14 MED FILL — LORazepam 0.5 MG TABS: 0.5 | 7 days supply | Qty: 20 | Fill #0

## 2017-09-17 MED FILL — predniSONE 10 MG TABS: 10 | 6 days supply | Qty: 21 | Fill #0

## 2017-09-17 MED FILL — BENZONATATE 200 MG CAPS: 200 | 10 days supply | Qty: 30 | Fill #0

## 2017-09-17 MED FILL — IPRATROPIUM 0.06% SPRAY: 0.06 | 14 days supply | Qty: 15 | Fill #0

## 2017-09-19 MED FILL — VENTOLIN HFA 90 MCG INHALER: 108 (90 BAS | 17 days supply | Qty: 18 | Fill #0

## 2017-10-02 MED FILL — VENTOLIN HFA 90 MCG INHALER: 108 (90 BAS | 17 days supply | Qty: 18 | Fill #1

## 2017-10-02 MED FILL — ESTRADIOL 10 MCG TABS: 10 | 28 days supply | Qty: 8 | Fill #1

## 2017-10-02 MED FILL — ROSUVASTATIN CALCIUM 5 MG T: 5 | 90 days supply | Qty: 90 | Fill #1

## 2017-10-02 MED FILL — LOSARTAN POTASSIUM 25 MG TA: 25 | 90 days supply | Qty: 90 | Fill #1

## 2017-12-19 MED FILL — ESTRADIOL 10 MCG TABS: 10 | 28 days supply | Qty: 8 | Fill #2

## 2017-12-19 MED FILL — ATOMOXETINE HCL 40 MG CAPS: 40 | 30 days supply | Qty: 30 | Fill #0

## 2017-12-27 ENCOUNTER — Telehealth: Payer: Self-pay | Admitting: *Deleted

## 2017-12-27 DIAGNOSIS — M810 Age-related osteoporosis without current pathological fracture: Secondary | ICD-10-CM

## 2017-12-27 NOTE — Telephone Encounter (Addendum)
Deductible Amount Met  OOP MAX $5800 ($552mt)  Annual exam  04/24/17 TF  Calcium 9.5      Date 02/26/17   Prior Authorization needed: on SOB it states YES when I called rep stays no PA needed reference # [id number] [id number] Pt estimated Cost $213  Coverage Details:20% of one dose    ABOVE IS FOR 2019 NO CALL BACK FOR APPT WILL RUN 2020 SUMMARY OF BENEFITS

## 2018-01-03 ENCOUNTER — Other Ambulatory Visit: Payer: Medicare Other

## 2018-01-03 DIAGNOSIS — M810 Age-related osteoporosis without current pathological fracture: Secondary | ICD-10-CM

## 2018-01-03 LAB — CREATININE, SERUM: Creat: 0.97 mg/dL (ref 0.50–0.99)

## 2018-01-10 NOTE — Telephone Encounter (Signed)
Prolia insurance verification has been sent awaiting Summary of benefits 2020

## 2018-01-20 MED FILL — ATOMOXETINE HCL 40 MG CAPS: 40 | 30 days supply | Qty: 30 | Fill #1

## 2018-01-20 NOTE — Telephone Encounter (Signed)
Deductible Amount Met  OOP MAX $4200 (0Met)  Annual exam 04/24/17 TF  Calcium  9.5     Date 02/26/17  Upcoming dental procedures   Prior Authorization needed SOB it states YES when I called rep stays no PA needed reference # [id number] [place] [phone] OR [phone]   Pt estimated Cost $222      Coverage Details:20% one dose, 20% admin fee

## 2018-01-20 NOTE — Telephone Encounter (Signed)
3rd attempt  No call back will close encounter and send letter out

## 2018-02-03 MED FILL — ESTRADIOL 10 MCG TABS: 10 | 28 days supply | Qty: 8 | Fill #3

## 2018-02-06 ENCOUNTER — Telehealth: Payer: Self-pay | Admitting: *Deleted

## 2018-02-06 NOTE — Telephone Encounter (Addendum)
Deductible Amount Met  OOP MAX $4200 (0Met)  Annual exam 04/24/17 TF  Calcium  9.5     Date 02/26/17  Upcoming dental procedures   Prior Authorization needed SOB it states YES when I called rep stays no PA needed reference # [id number] [place] [phone] OR [phone]   Pt estimated Cost $222   appt 02/07/2018 @ 2:00   Coverage Details:20% one dose, 20% admin fee

## 2018-02-07 ENCOUNTER — Ambulatory Visit: Payer: Medicare Other

## 2018-02-10 ENCOUNTER — Ambulatory Visit: Payer: Medicare Other

## 2018-02-12 ENCOUNTER — Ambulatory Visit (INDEPENDENT_AMBULATORY_CARE_PROVIDER_SITE_OTHER): Payer: Medicare Other | Admitting: Gynecology

## 2018-02-12 DIAGNOSIS — M810 Age-related osteoporosis without current pathological fracture: Secondary | ICD-10-CM | POA: Diagnosis not present

## 2018-02-12 MED ORDER — DENOSUMAB 60 MG/ML ~~LOC~~ SOSY
60.0000 mg | PREFILLED_SYRINGE | Freq: Once | SUBCUTANEOUS | Status: AC
  Administered 2018-02-12: 60 mg via SUBCUTANEOUS

## 2018-02-18 NOTE — Telephone Encounter (Signed)
PROLIA GIVEN 02/12/2018 NEXT INJECTION 08/14/2018

## 2018-02-24 MED FILL — ATOMOXETINE HCL 40 MG CAPS: 40 | 30 days supply | Qty: 30 | Fill #2

## 2018-03-07 MED FILL — SERTRALINE HCL 50 MG TABLET: 50 | 90 days supply | Qty: 90 | Fill #0

## 2018-03-12 MED FILL — ESTRADIOL 10 MCG TABS: 10 | 28 days supply | Qty: 8 | Fill #4

## 2018-03-26 MED FILL — ATOMOXETINE HCL 40 MG CAPS: 40 | 30 days supply | Qty: 30 | Fill #0 | Status: TO

## 2018-04-10 ENCOUNTER — Encounter: Payer: Self-pay | Admitting: Gynecology

## 2018-04-10 MED FILL — ESTRADIOL 10 MCG TABS: 10 | 30 days supply | Qty: 18 | Fill #0

## 2018-04-10 MED FILL — CIPROFLOXACIN HCL 500 MG TA: 500 | 3 days supply | Qty: 6 | Fill #0

## 2018-04-21 MED FILL — ATOMOXETINE HCL 40 MG CAPS: 40 | 30 days supply | Qty: 30 | Fill #0

## 2018-04-23 MED FILL — ROSUVASTATIN CALCIUM 5 MG T: 5 | 90 days supply | Qty: 90 | Fill #0

## 2018-04-23 MED FILL — LOSARTAN POTASSIUM 25 MG TA: 25 | 30 days supply | Qty: 30 | Fill #0

## 2018-04-28 ENCOUNTER — Other Ambulatory Visit: Payer: Self-pay

## 2018-04-30 ENCOUNTER — Other Ambulatory Visit: Payer: Self-pay

## 2018-04-30 ENCOUNTER — Encounter: Payer: Self-pay | Admitting: Gynecology

## 2018-04-30 ENCOUNTER — Ambulatory Visit: Payer: Medicare Other | Admitting: Gynecology

## 2018-04-30 VITALS — BP 124/82 | Ht 67.0 in | Wt 206.0 lb

## 2018-04-30 DIAGNOSIS — Z9189 Other specified personal risk factors, not elsewhere classified: Secondary | ICD-10-CM

## 2018-04-30 DIAGNOSIS — Z124 Encounter for screening for malignant neoplasm of cervix: Secondary | ICD-10-CM

## 2018-04-30 DIAGNOSIS — N952 Postmenopausal atrophic vaginitis: Secondary | ICD-10-CM | POA: Diagnosis not present

## 2018-04-30 DIAGNOSIS — Z01419 Encounter for gynecological examination (general) (routine) without abnormal findings: Secondary | ICD-10-CM

## 2018-04-30 DIAGNOSIS — M810 Age-related osteoporosis without current pathological fracture: Secondary | ICD-10-CM

## 2018-04-30 NOTE — Addendum Note (Signed)
Addended by: Nelva Nay on: 04/30/2018 01:07 PM   Modules accepted: Orders

## 2018-04-30 NOTE — Progress Notes (Signed)
    Sharon Daniels Oct 14, 1950 [id number]        68 y.o.  [id number] for breast and pelvic exam.  Without gynecologic complaints.  Several issues noted below.  Past medical history,surgical history, problem list, medications, allergies, family history and social history were all reviewed and documented as reviewed in the EPIC chart.  ROS:  Performed with pertinent positives and negatives included in the history, assessment and plan.   Additional significant findings : None   Exam: [name] assistant Vitals:   04/30/18 1232  BP: 124/82  Weight: 206 lb (93.4 kg)  Height: 5\' 7"  (1.702 m)   Body mass index is 32.26 kg/m.  General appearance:  Normal affect, orientation and appearance. Skin: Grossly normal HEENT: Without gross lesions.  No cervical or supraclavicular adenopathy. Thyroid normal.  Lungs:  Clear without wheezing, rales or rhonchi Cardiac: RR, without RMG Abdominal:  Soft, nontender, without masses, guarding, rebound, organomegaly or hernia Breasts:  Examined lying and sitting without masses, retractions, discharge or axillary adenopathy. Pelvic:  Ext, BUS, Vagina: With atrophic changes  Cervix: With atrophic changes.  Pap smear done  Uterus: Anteverted, normal size, shape and contour, midline and mobile nontender   Adnexa: Without masses or tenderness    Anus and perineum: Normal   Rectovaginal: Normal sphincter tone without palpated masses or tenderness.    Assessment/Plan:  68 y.o. [id number] female for breast and pelvic exam  1. Postmenopausal/atrophic genital changes.  Continues on Vagifem 10 mcg twice weekly.  Is doing well with this and wants to continue.  Has supply at home but will call when needs more.  We discussed the issues and risks previously to include absorption with systemic effects and she accepts the risks. 2. Osteoporosis.  DEXA 2017 T score -2.7.  Started on Prolia by Dr. Toney Rakes 2 years ago.  Needs to schedule follow-up DEXA now and she will  call [facility] and schedule this.  Will continue on Prolia.  Will follow-up at six-month interval when due.  We discussed the risks versus benefits previously and she is comfortable with this. 3. Mammography 2018.  Need to call [facility] and schedule a screening mammogram stressed.  Patient agrees to do so with her DEXA.  Breast exam normal today. 4. Colonoscopy 2016.  Repeat at their recommended interval. 5. Pap smear 2017.  Pap smear done today.  No history of abnormal Pap smears. 6. Health maintenance.  No routine lab work done as patient does this elsewhere.  Follow-up 1 year, sooner as needed.   [name] MD, 1:01 PM 04/30/2018

## 2018-04-30 NOTE — Patient Instructions (Signed)
Schedule your mammogram and your bone density at [facility] whenever you are able to.  Follow-up for your Prolia shot when you are due at a six-month interval  Follow-up in 1 year for annual exam

## 2018-05-01 LAB — PAP IG W/ RFLX HPV ASCU

## 2018-05-26 MED FILL — ATOMOXETINE HCL 40 MG CAPS: 40 | 30 days supply | Qty: 30 | Fill #1

## 2018-05-29 ENCOUNTER — Other Ambulatory Visit (HOSPITAL_COMMUNITY): Payer: Self-pay | Admitting: Orthopedic Surgery

## 2018-06-06 MED FILL — SYMBICORT 160-4.5 MCG INH: 160-4.5 | 30 days supply | Qty: 10 | Fill #0

## 2018-06-06 MED FILL — MONTELUKAST SOD 10 MG TAB: 10 | 90 days supply | Qty: 90 | Fill #0

## 2018-06-14 MED FILL — LOSARTAN POTASSIUM 25 MG TA: 25 | 30 days supply | Qty: 30 | Fill #1

## 2018-06-16 MED FILL — SERTRALINE HCL 50 MG TABLET: 50 | 90 days supply | Qty: 90 | Fill #1

## 2018-06-19 ENCOUNTER — Other Ambulatory Visit: Payer: Self-pay

## 2018-06-19 ENCOUNTER — Encounter (HOSPITAL_BASED_OUTPATIENT_CLINIC_OR_DEPARTMENT_OTHER): Payer: Self-pay | Admitting: *Deleted

## 2018-06-23 ENCOUNTER — Other Ambulatory Visit (HOSPITAL_COMMUNITY)
Admission: RE | Admit: 2018-06-23 | Discharge: 2018-06-23 | Disposition: A | Discharge status: Home / Self Care | Payer: Medicare Other | Source: Ambulatory Visit | Attending: Orthopedic Surgery | Admitting: Orthopedic Surgery

## 2018-06-23 ENCOUNTER — Encounter (HOSPITAL_BASED_OUTPATIENT_CLINIC_OR_DEPARTMENT_OTHER)
Admission: RE | Admit: 2018-06-23 | Discharge: 2018-06-23 | Disposition: A | Discharge status: Home / Self Care | Payer: Medicare Other | Source: Ambulatory Visit | Attending: Orthopedic Surgery | Admitting: Orthopedic Surgery

## 2018-06-23 ENCOUNTER — Other Ambulatory Visit: Payer: Self-pay

## 2018-06-23 DIAGNOSIS — Z01810 Encounter for preprocedural cardiovascular examination: Secondary | ICD-10-CM | POA: Insufficient documentation

## 2018-06-23 DIAGNOSIS — H9190 Unspecified hearing loss, unspecified ear: Secondary | ICD-10-CM | POA: Diagnosis not present

## 2018-06-23 DIAGNOSIS — Z1159 Encounter for screening for other viral diseases: Secondary | ICD-10-CM | POA: Diagnosis present

## 2018-06-23 DIAGNOSIS — J449 Chronic obstructive pulmonary disease, unspecified: Secondary | ICD-10-CM | POA: Diagnosis not present

## 2018-06-23 DIAGNOSIS — M7741 Metatarsalgia, right foot: Secondary | ICD-10-CM | POA: Diagnosis not present

## 2018-06-23 DIAGNOSIS — M2041 Other hammer toe(s) (acquired), right foot: Secondary | ICD-10-CM | POA: Diagnosis not present

## 2018-06-23 DIAGNOSIS — I493 Ventricular premature depolarization: Secondary | ICD-10-CM | POA: Insufficient documentation

## 2018-06-23 NOTE — Progress Notes (Signed)
Ensure pre surgery drink given with instructions to complete by 0430 dos, pt verbalized understanding. 

## 2018-06-25 LAB — NOVEL CORONAVIRUS, NAA (HOSP ORDER, SEND-OUT TO REF LAB; TAT 18-24 HRS): SARS-CoV-2, NAA: NOT DETECTED

## 2018-06-25 NOTE — Anesthesia Preprocedure Evaluation (Addendum)
Anesthesia Evaluation  Patient identified by MRN, date of birth, ID band Patient awake    Reviewed: Allergy & Precautions, NPO status , Patient's Chart, lab work & pertinent test results  History of Anesthesia Complications (+) PONV  Airway Mallampati: II  TM Distance: >3 FB Neck ROM: Full    Dental no notable dental hx. (+) Teeth Intact   Pulmonary former smoker,    Pulmonary exam normal breath sounds clear to auscultation       Cardiovascular hypertension, Pt. on medications negative cardio ROS Normal cardiovascular exam Rhythm:Regular Rate:Normal     Neuro/Psych Depression  Neuromuscular disease    GI/Hepatic Neg liver ROS, GERD  ,  Endo/Other  negative endocrine ROS  Renal/GU negative Renal ROS     Musculoskeletal  (+) Arthritis ,   Abdominal   Peds  Hematology   Anesthesia Other Findings   Reproductive/Obstetrics                           Lab Results  Component Value Date   WBC 7.6 07/27/2015   HGB 12.1 07/27/2015   HCT 38.1 07/27/2015   MCV 98.2 07/27/2015   PLT 289 07/27/2015    Anesthesia Physical Anesthesia Plan  ASA: II  Anesthesia Plan: General   Post-op Pain Management:  Regional for Post-op pain   Induction:   PONV Risk Score and Plan: 3 and Treatment may vary due to age or medical condition, Ondansetron, Dexamethasone and TIVA  Airway Management Planned: LMA  Additional Equipment:   Intra-op Plan:   Post-operative Plan:   Informed Consent: I have reviewed the patients History and Physical, chart, labs and discussed the procedure including the risks, benefits and alternatives for the proposed anesthesia with the patient or authorized representative who has indicated his/her understanding and acceptance.     Dental advisory given  Plan Discussed with:   Anesthesia Plan Comments: (GA w R popliteal)      Anesthesia Quick Evaluation

## 2018-06-26 ENCOUNTER — Ambulatory Visit (HOSPITAL_BASED_OUTPATIENT_CLINIC_OR_DEPARTMENT_OTHER)
Admission: RE | Admit: 2018-06-26 | Discharge: 2018-06-26 | Disposition: A | Discharge status: Home / Self Care | Payer: Medicare Other | Attending: Orthopedic Surgery | Admitting: Orthopedic Surgery

## 2018-06-26 ENCOUNTER — Encounter (HOSPITAL_BASED_OUTPATIENT_CLINIC_OR_DEPARTMENT_OTHER)
Admission: RE | Disposition: A | Discharge status: Home / Self Care | Payer: Self-pay | Source: Home / Self Care | Attending: Orthopedic Surgery

## 2018-06-26 ENCOUNTER — Encounter (HOSPITAL_BASED_OUTPATIENT_CLINIC_OR_DEPARTMENT_OTHER): Payer: Self-pay | Admitting: Certified Registered"

## 2018-06-26 ENCOUNTER — Other Ambulatory Visit: Payer: Self-pay

## 2018-06-26 ENCOUNTER — Ambulatory Visit (HOSPITAL_BASED_OUTPATIENT_CLINIC_OR_DEPARTMENT_OTHER): Payer: Medicare Other | Admitting: Anesthesiology

## 2018-06-26 DIAGNOSIS — Z96653 Presence of artificial knee joint, bilateral: Secondary | ICD-10-CM | POA: Diagnosis not present

## 2018-06-26 DIAGNOSIS — H9190 Unspecified hearing loss, unspecified ear: Secondary | ICD-10-CM | POA: Diagnosis not present

## 2018-06-26 DIAGNOSIS — I10 Essential (primary) hypertension: Secondary | ICD-10-CM | POA: Insufficient documentation

## 2018-06-26 DIAGNOSIS — Z86711 Personal history of pulmonary embolism: Secondary | ICD-10-CM | POA: Insufficient documentation

## 2018-06-26 DIAGNOSIS — Z79899 Other long term (current) drug therapy: Secondary | ICD-10-CM | POA: Insufficient documentation

## 2018-06-26 DIAGNOSIS — M2041 Other hammer toe(s) (acquired), right foot: Secondary | ICD-10-CM | POA: Insufficient documentation

## 2018-06-26 DIAGNOSIS — K219 Gastro-esophageal reflux disease without esophagitis: Secondary | ICD-10-CM | POA: Diagnosis not present

## 2018-06-26 DIAGNOSIS — Z87891 Personal history of nicotine dependence: Secondary | ICD-10-CM | POA: Diagnosis not present

## 2018-06-26 DIAGNOSIS — Z79890 Hormone replacement therapy (postmenopausal): Secondary | ICD-10-CM | POA: Insufficient documentation

## 2018-06-26 DIAGNOSIS — M7741 Metatarsalgia, right foot: Secondary | ICD-10-CM | POA: Diagnosis not present

## 2018-06-26 DIAGNOSIS — J449 Chronic obstructive pulmonary disease, unspecified: Secondary | ICD-10-CM | POA: Insufficient documentation

## 2018-06-26 DIAGNOSIS — F329 Major depressive disorder, single episode, unspecified: Secondary | ICD-10-CM | POA: Insufficient documentation

## 2018-06-26 HISTORY — PX: HAMMERTOE RECONSTRUCTION WITH WEIL OSTEOTOMY: SHX5631

## 2018-06-26 HISTORY — DX: Gastro-esophageal reflux disease without esophagitis: K21.9

## 2018-06-26 HISTORY — DX: Chronic obstructive pulmonary disease, unspecified: J44.9

## 2018-06-26 SURGERY — HAMMERTOE RECONSTRUCTION WITH WEIL OSTEOTOMY
Anesthesia: General | Site: Foot | Laterality: Right

## 2018-06-26 MED ORDER — MIDAZOLAM HCL 2 MG/2ML IJ SOLN
INTRAMUSCULAR | Status: AC
  Filled 2018-06-26: qty 2

## 2018-06-26 MED ORDER — PROPOFOL 10 MG/ML IV BOLUS
INTRAVENOUS | Status: DC | PRN
  Administered 2018-06-26: 150 mg via INTRAVENOUS

## 2018-06-26 MED ORDER — FENTANYL CITRATE (PF) 100 MCG/2ML IJ SOLN
50.0000 ug | INTRAMUSCULAR | Status: DC | PRN
  Administered 2018-06-26: 50 ug via INTRAVENOUS

## 2018-06-26 MED ORDER — SCOPOLAMINE 1 MG/3DAYS TD PT72: 1.0000 | MEDICATED_PATCH | Freq: Once | TRANSDERMAL | Status: DC

## 2018-06-26 MED ORDER — CEFAZOLIN SODIUM-DEXTROSE 2-4 GM/100ML-% IV SOLN
INTRAVENOUS | Status: AC
  Filled 2018-06-26: qty 100

## 2018-06-26 MED ORDER — DOCUSATE SODIUM 100 MG PO CAPS: 100.0000 mg | ORAL_CAPSULE | Freq: Two times a day (BID) | ORAL | 0 refills | Status: DC

## 2018-06-26 MED ORDER — SENNA 8.6 MG PO TABS: 2.0000 | ORAL_TABLET | Freq: Two times a day (BID) | ORAL | 0 refills | Status: DC

## 2018-06-26 MED ORDER — OXYCODONE HCL 5 MG PO TABS: 5.0000 mg | ORAL_TABLET | Freq: Four times a day (QID) | ORAL | 0 refills | Status: AC | PRN

## 2018-06-26 MED ORDER — LIDOCAINE HCL (CARDIAC) PF 100 MG/5ML IV SOSY
PREFILLED_SYRINGE | INTRAVENOUS | Status: DC | PRN
  Administered 2018-06-26: 30 mg via INTRAVENOUS

## 2018-06-26 MED ORDER — CEFAZOLIN SODIUM-DEXTROSE 2-4 GM/100ML-% IV SOLN
2.0000 g | INTRAVENOUS | Status: AC
  Administered 2018-06-26: 2 g via INTRAVENOUS

## 2018-06-26 MED ORDER — ONDANSETRON HCL 4 MG/2ML IJ SOLN
4.0000 mg | Freq: Once | INTRAMUSCULAR | Status: AC | PRN
  Administered 2018-06-26: 4 mg via INTRAVENOUS

## 2018-06-26 MED ORDER — SODIUM CHLORIDE 0.9 % IV SOLN: INTRAVENOUS | Status: DC

## 2018-06-26 MED ORDER — FENTANYL CITRATE (PF) 100 MCG/2ML IJ SOLN
INTRAMUSCULAR | Status: AC
  Filled 2018-06-26: qty 2

## 2018-06-26 MED ORDER — ACETAMINOPHEN 10 MG/ML IV SOLN: 1000.0000 mg | Freq: Once | INTRAVENOUS | Status: DC | PRN

## 2018-06-26 MED ORDER — ROPIVACAINE HCL 5 MG/ML IJ SOLN
INTRAMUSCULAR | Status: DC | PRN
  Administered 2018-06-26: 30 mL via PERINEURAL

## 2018-06-26 MED ORDER — PROPOFOL 500 MG/50ML IV EMUL
INTRAVENOUS | Status: DC | PRN
  Administered 2018-06-26: 150 ug/kg/min via INTRAVENOUS

## 2018-06-26 MED ORDER — DEXAMETHASONE SODIUM PHOSPHATE 10 MG/ML IJ SOLN
INTRAMUSCULAR | Status: DC | PRN
  Administered 2018-06-26: 10 mg via INTRAVENOUS

## 2018-06-26 MED ORDER — CHLORHEXIDINE GLUCONATE 4 % EX LIQD: 60.0000 mL | Freq: Once | CUTANEOUS | Status: DC

## 2018-06-26 MED ORDER — FENTANYL CITRATE (PF) 100 MCG/2ML IJ SOLN: 25.0000 ug | INTRAMUSCULAR | Status: DC | PRN

## 2018-06-26 MED ORDER — MIDAZOLAM HCL 2 MG/2ML IJ SOLN
1.0000 mg | INTRAMUSCULAR | Status: DC | PRN
  Administered 2018-06-26: 07:00:00 1 mg via INTRAVENOUS

## 2018-06-26 MED ORDER — LACTATED RINGERS IV SOLN
INTRAVENOUS | Status: DC
  Administered 2018-06-26: 07:00:00 via INTRAVENOUS

## 2018-06-26 MED FILL — oxyCODONE HCL 5 MG TABS: 5 | 5 days supply | Qty: 20 | Fill #0

## 2018-06-26 SURGICAL SUPPLY — 78 items
APL PRP STRL LF DISP 70% ISPRP (MISCELLANEOUS) ×1
BANDAGE ESMARK 6X9 LF (GAUZE/BANDAGES/DRESSINGS) IMPLANT
BIT DRILL CANN 2.4 (BIT) ×2
BIT DRILL CANN MAX VPC 2.4 (BIT) IMPLANT
BLADE AVERAGE 25X9 (BLADE) IMPLANT
BLADE LONG MED 25X9 (BLADE) ×2 IMPLANT
BLADE OSC/SAG .038X5.5 CUT EDG (BLADE) IMPLANT
BLADE SURG 15 STRL LF DISP TIS (BLADE) ×2 IMPLANT
BLADE SURG 15 STRL SS (BLADE) ×6
BNDG CMPR 9X4 STRL LF SNTH (GAUZE/BANDAGES/DRESSINGS)
BNDG CMPR 9X6 STRL LF SNTH (GAUZE/BANDAGES/DRESSINGS)
BNDG COHESIVE 4X5 TAN STRL (GAUZE/BANDAGES/DRESSINGS) ×2 IMPLANT
BNDG CONFORM 2 STRL LF (GAUZE/BANDAGES/DRESSINGS) IMPLANT
BNDG CONFORM 3 STRL LF (GAUZE/BANDAGES/DRESSINGS) ×2 IMPLANT
BNDG ESMARK 4X9 LF (GAUZE/BANDAGES/DRESSINGS) IMPLANT
BNDG ESMARK 6X9 LF (GAUZE/BANDAGES/DRESSINGS)
CAP PIN PROTECTOR ORTHO WHT (CAP) IMPLANT
CHLORAPREP W/TINT 26 (MISCELLANEOUS) ×2 IMPLANT
COVER BACK TABLE REUSABLE LG (DRAPES) ×2 IMPLANT
COVER WAND RF STERILE (DRAPES) IMPLANT
CUFF TOURN SGL QUICK 34 (TOURNIQUET CUFF) ×2
CUFF TRNQT CYL 34X4.125X (TOURNIQUET CUFF) IMPLANT
DRAPE EXTREMITY T 121X128X90 (DISPOSABLE) ×2 IMPLANT
DRAPE OEC MINIVIEW 54X84 (DRAPES) ×2 IMPLANT
DRAPE U-SHAPE 47X51 STRL (DRAPES) ×2 IMPLANT
DRSG MEPITEL 4X7.2 (GAUZE/BANDAGES/DRESSINGS) ×2 IMPLANT
DRSG PAD ABDOMINAL 8X10 ST (GAUZE/BANDAGES/DRESSINGS) ×3 IMPLANT
ELECT REM PT RETURN 9FT ADLT (ELECTROSURGICAL) ×2
ELECTRODE REM PT RTRN 9FT ADLT (ELECTROSURGICAL) ×1 IMPLANT
GAUZE SPONGE 4X4 12PLY STRL (GAUZE/BANDAGES/DRESSINGS) ×2 IMPLANT
GLOVE BIO SURGEON STRL SZ8 (GLOVE) ×2 IMPLANT
GLOVE BIOGEL PI IND STRL 6.5 (GLOVE) IMPLANT
GLOVE BIOGEL PI IND STRL 7.0 (GLOVE) IMPLANT
GLOVE BIOGEL PI IND STRL 8 (GLOVE) ×2 IMPLANT
GLOVE BIOGEL PI INDICATOR 6.5 (GLOVE) ×1
GLOVE BIOGEL PI INDICATOR 7.0 (GLOVE) ×1
GLOVE BIOGEL PI INDICATOR 8 (GLOVE) ×2
GLOVE ECLIPSE 6.5 STRL STRAW (GLOVE) ×1 IMPLANT
GLOVE ECLIPSE 8.0 STRL XLNG CF (GLOVE) ×2 IMPLANT
GLOVE SURG SS PI 7.0 STRL IVOR (GLOVE) ×1 IMPLANT
GOWN STRL REUS W/ TWL LRG LVL3 (GOWN DISPOSABLE) ×1 IMPLANT
GOWN STRL REUS W/ TWL XL LVL3 (GOWN DISPOSABLE) ×2 IMPLANT
GOWN STRL REUS W/TWL LRG LVL3 (GOWN DISPOSABLE) ×2
GOWN STRL REUS W/TWL XL LVL3 (GOWN DISPOSABLE) ×6
K-WIRE .054X4 (WIRE) IMPLANT
K-WIRE COCR 1.1X105 (WIRE) ×2
KWIRE COCR 1.1X105 (WIRE) IMPLANT
NEEDLE HYPO 22GX1.5 SAFETY (NEEDLE) IMPLANT
NS IRRIG 1000ML POUR BTL (IV SOLUTION) ×2 IMPLANT
PACK BASIN DAY SURGERY FS (CUSTOM PROCEDURE TRAY) ×2 IMPLANT
PAD CAST 4YDX4 CTTN HI CHSV (CAST SUPPLIES) ×1 IMPLANT
PADDING CAST ABS 4INX4YD NS (CAST SUPPLIES)
PADDING CAST ABS COTTON 4X4 ST (CAST SUPPLIES) IMPLANT
PADDING CAST COTTON 4X4 STRL (CAST SUPPLIES) ×2
PASSER SUT SWANSON 36MM LOOP (INSTRUMENTS) IMPLANT
PENCIL BUTTON HOLSTER BLD 10FT (ELECTRODE) ×2 IMPLANT
SANITIZER HAND PURELL 535ML FO (MISCELLANEOUS) ×2 IMPLANT
SCREW CANN MAX VPC 3.4X20 (Screw) IMPLANT
SCREW HCS TWIST-OFF 2.0X12MM (Screw) ×1 IMPLANT
SCREW VPS 3.4X20MM (Screw) ×1 IMPLANT
SHEET MEDIUM DRAPE 40X70 STRL (DRAPES) ×2 IMPLANT
SLEEVE SCD COMPRESS KNEE MED (MISCELLANEOUS) ×2 IMPLANT
SPONGE LAP 18X18 RF (DISPOSABLE) ×2 IMPLANT
STOCKINETTE 6  STRL (DRAPES) ×1
STOCKINETTE 6 STRL (DRAPES) ×1 IMPLANT
SUCTION FRAZIER HANDLE 10FR (MISCELLANEOUS) ×1
SUCTION TUBE FRAZIER 10FR DISP (MISCELLANEOUS) ×1 IMPLANT
SUT ETHILON 3 0 PS 1 (SUTURE) ×2 IMPLANT
SUT MNCRL AB 3-0 PS2 18 (SUTURE) ×2 IMPLANT
SUT VIC AB 2-0 SH 27 (SUTURE)
SUT VIC AB 2-0 SH 27XBRD (SUTURE) IMPLANT
SUT VICRYL 0 UR6 27IN ABS (SUTURE) IMPLANT
SYR BULB 3OZ (MISCELLANEOUS) ×2 IMPLANT
SYR CONTROL 10ML LL (SYRINGE) IMPLANT
TOWEL GREEN STERILE FF (TOWEL DISPOSABLE) ×2 IMPLANT
TUBE CONNECTING 20X1/4 (TUBING) ×2 IMPLANT
UNDERPAD 30X30 (UNDERPADS AND DIAPERS) ×2 IMPLANT
YANKAUER SUCT BULB TIP NO VENT (SUCTIONS) IMPLANT

## 2018-06-26 NOTE — Anesthesia Postprocedure Evaluation (Signed)
Anesthesia Post Note  Patient: Sharon Daniels  Procedure(s) Performed: Right 2nd toe hammertoe correction; 2nd metatarsal Weil osteotomy (Right Foot)     Patient location during evaluation: PACU Anesthesia Type: General Level of consciousness: awake and alert Pain management: pain level controlled Vital Signs Assessment: post-procedure vital signs reviewed and stable Respiratory status: spontaneous breathing, nonlabored ventilation, respiratory function stable and patient connected to nasal cannula oxygen Cardiovascular status: blood pressure returned to baseline and stable Postop Assessment: no apparent nausea or vomiting Anesthetic complications: no    Last Vitals:  Vitals:   06/26/18 0830 06/26/18 0900  BP: 136/73 (!) 156/84  Pulse: 82 78  Resp: 19 18  Temp:  36.6 C  SpO2: 99% 100%    Last Pain:  Vitals:   06/26/18 0900  TempSrc:   PainSc: 0-No pain                 Barnet Glasgow

## 2018-06-26 NOTE — H&P (Signed)
Sharon Daniels is an 68 y.o. female.   Chief Complaint: right foot pain HPI: The patient is a 68 year old female with a long history of right forefoot pain due to metatarsalgia and a second hammertoe deformity.  She has failed nonoperative treatment to date including activity modification, oral anti-inflammatories, shoewear modification and bracing.  She presents now for operative treatment of this painful and limiting condition.  Past Medical History:  Diagnosis Date  . Acute pulmonary embolism ([place]) 06/13/2013   CT angio chest 06/12/13  . Allergy   . Anemia   . Arthritis   . Complication of anesthesia    "during breast surgery: could hear the surgery, wake up too soon, anxious  . COPD (chronic obstructive pulmonary disease) ([place])   . Depression    hx of  . GERD (gastroesophageal reflux disease)   . H/O bronchitis   . Hard of hearing   . History of kidney stones    x3  . Hypertension   . Osteoporosis   . Pneumonia    hx of 10 years ago  . PONV (postoperative nausea and vomiting)    "mild nausea"  . Stress incontinence   . Substance abuse ([place])   . Wears glasses     Past Surgical History:  Procedure Laterality Date  . ABDOMINOPLASTY  09/2015  . BREAST REDUCTION SURGERY  08/04/2015  . BREAST REDUCTION SURGERY Bilateral 08/04/2015   Procedure: MAMMARY REDUCTION  (BREAST);  Surgeon: Crissie Reese, MD;  Location: [facility];  Service: Plastics;  Laterality: Bilateral;  . BREAST SURGERY  1985   abscess  . CARPAL TUNNEL RELEASE Left   . [place]  . COLONOSCOPY    . COSMETIC SURGERY  2013   in the office  . COSMETIC SURGERY     ear lobes shorten  . NASAL SINUS SURGERY  2000  . TOTAL KNEE ARTHROPLASTY Bilateral 06/05/2013   Procedure: BILATERAL TOTAL KNEE ARTHROPLASTY;  Surgeon: Gearlean Alf, MD;  Location: [facility];  Service: Orthopedics;  Laterality: Bilateral;    Family History  Problem Relation Age of Onset  . Kidney disease Mother   . Atrial  fibrillation Mother   . Breast cancer Mother   . Ovarian cancer Mother   . Heart attack Father   . Colon cancer Neg Hx    Social History:  reports that she quit smoking about 8 months ago. Her smoking use included cigarettes. She has a 30.00 pack-year smoking history. She has never used smokeless tobacco. She reports current alcohol use. She reports that she does not use drugs.  Allergies:  Allergies  Allergen Reactions  . Morphine And Related Itching    Other reaction(s): ITCHING   . Morphine Itching, Anxiety and Rash    IV only, made her mean    Medications Prior to Admission  Medication Sig Dispense Refill  . Cholecalciferol (VITAMIN D3) 1000 UNITS CAPS Take 10,000 Units by mouth daily.     . Estradiol 10 MCG TABS vaginal tablet Place 1 tablet (10 mcg total) vaginally 2 (two) times a week. 8 tablet 12  . losartan (COZAAR) 25 MG tablet TAKE 1 TABLET  25 MG  BY MOUTH DAILY  2  . montelukast (SINGULAIR) 10 MG tablet Take 10 mg by mouth daily.    . Multiple Vitamin (MULTIVITAMIN) tablet Take 1 tablet by mouth daily.    Marland Kitchen omeprazole (PRILOSEC) 40 MG capsule Take 40 mg by mouth daily as needed (for acid reflux or heartburn). Reported  on 04/21/2015    . Propylene Glycol (SYSTANE BALANCE) 0.6 % SOLN Apply 1 drop to eye as needed (Dry eyes). Reported on 04/21/2015    . rosuvastatin (CRESTOR) 5 MG tablet Take 5 mg by mouth daily.  3  . sertraline (ZOLOFT) 25 MG tablet Take 25 mg by mouth daily.    Marland Kitchen thiamine (VITAMIN B-1) 100 MG tablet Take 100 mg by mouth daily.    Marland Kitchen denosumab (PROLIA) 60 MG/ML SOLN injection Inject 60 mg into the skin once. Administer in upper arm, thigh, or abdomen 1 Syringe 0  . fluticasone (FLONASE) 50 MCG/ACT nasal spray Place 1 spray into both nostrils daily as needed (for seasonal allergies). Reported on 04/21/2015    . valsartan (DIOVAN) 80 MG tablet Take 80 mg by mouth every morning.       No results found for this or any previous visit (from the past 48  hour(s)). No results found.  ROS no recent fever, chills, nausea, vomiting or changes in her appetite  Blood pressure 127/83, pulse 76, temperature 98.3 F (36.8 C), temperature source Oral, resp. rate 18, height 5\' 7"  (1.702 m), weight 94.7 kg, SpO2 100 %. Physical Exam  Well-nourished well-developed woman in no apparent distress.  Alert and oriented x4.  Mood and affect are normal.  Extraocular motions are intact.  Respirations are unlabored.  Gait is normal.  The right foot has a second hammertoe deformity.  Thick callus beneath the second metatarsal head.  Skin is healthy and intact.  Pulses are palpable.  No lymphadenopathy.  5 out of 5 strength in plantarflexion and dorsiflexion of the ankle and toes.  Assessment/Plan Right foot metatarsalgia and second hammertoe deformity -to the operating room today for surgical correction.  The risks and benefits of the alternative treatment options have been discussed in detail.  The patient wishes to proceed with surgery and specifically understands risks of bleeding, infection, nerve damage, blood clots, need for additional surgery, amputation and death.   Wylene Simmer, MD July 03, 2018, 7:21 AM

## 2018-06-26 NOTE — Transfer of Care (Signed)
Immediate Anesthesia Transfer of Care Note  Patient: Sharon Daniels  Procedure(s) Performed: Right 2nd toe hammertoe correction; 2nd metatarsal Weil osteotomy (Right Foot)  Patient Location: PACU  Anesthesia Type:GA combined with regional for post-op pain  Level of Consciousness: awake, alert , oriented and patient cooperative  Airway & Oxygen Therapy: Patient Spontanous Breathing and Patient connected to nasal cannula oxygen  Post-op Assessment: Report given to RN and Post -op Vital signs reviewed and stable  Post vital signs: Reviewed and stable  Last Vitals:  Vitals Value Taken Time  BP    Temp    Pulse 81 06/26/18 0803  Resp 25 06/26/18 0803  SpO2 100 % 06/26/18 0803  Vitals shown include unvalidated device data.  Last Pain:  Vitals:   06/26/18 0631  TempSrc: Oral  PainSc: 0-No pain      Patients Stated Pain Goal: 0 (83/77/93 9688)  Complications: No apparent anesthesia complications

## 2018-06-26 NOTE — Progress Notes (Signed)
Assisted Dr. Houser with right, ultrasound guided, popliteal block. Side rails up, monitors on throughout procedure. See vital signs in flow sheet. Tolerated Procedure well. 

## 2018-06-26 NOTE — Anesthesia Procedure Notes (Signed)
Procedure Name: LMA Insertion Date/Time: 06/26/2018 7:36 AM Performed by: Signe Colt, CRNA Pre-anesthesia Checklist: Patient identified, Emergency Drugs available, Suction available and Patient being monitored Patient Re-evaluated:Patient Re-evaluated prior to induction Oxygen Delivery Method: Circle system utilized Preoxygenation: Pre-oxygenation with 100% oxygen Induction Type: IV induction Ventilation: Mask ventilation without difficulty LMA: LMA inserted LMA Size: 4.0 Number of attempts: 1 Airway Equipment and Method: Bite block Placement Confirmation: positive ETCO2 Tube secured with: Tape Dental Injury: Teeth and Oropharynx as per pre-operative assessment

## 2018-06-26 NOTE — Op Note (Signed)
06/26/2018  8:05 AM  PATIENT:  Sharon Daniels  68 y.o. female  PRE-OPERATIVE DIAGNOSIS: 1.  Right forefoot metatarsalgia      2.  Right foot second hammertoe deformity  POST-OPERATIVE DIAGNOSIS: Same  Procedure(s): 1.  Right second metatarsal Weil osteotomy 2.  Right second hammertoe correction 3.  Right foot AP and lateral radiographs  SURGEON:  Wylene Simmer, MD  ASSISTANT: Mechele Claude, PA-C  ANESTHESIA:   General, regional  EBL:  minimal   TOURNIQUET:   Total Tourniquet Time Documented: Thigh (Right) - 21 minutes Total: Thigh (Right) - 21 minutes  COMPLICATIONS:  None apparent  DISPOSITION:  Extubated, awake and stable to recovery.  INDICATION FOR PROCEDURE: The patient is a 68 year old female with a long history of right forefoot pain due to metatarsalgia and a second hammertoe deformity.  She has failed nonoperative treatment to date including activity modification, oral anti-inflammatories, shoewear modification and bracing.  She presents now for operative treatment of this painful and limiting condition.  The risks and benefits of the alternative treatment options have been discussed in detail.  The patient wishes to proceed with surgery and specifically understands risks of bleeding, infection, nerve damage, blood clots, need for additional surgery, amputation and death.  PROCEDURE IN DETAIL:  After pre operative consent was obtained, and the correct operative site was identified, the patient was brought to the operating room and placed supine on the OR table.  Anesthesia was administered.  Pre-operative antibiotics were administered.  A surgical timeout was taken.  The right lower extremity was prepped and draped in standard sterile fashion with a tourniquet around the thigh.  The extremity was elevated and the tourniquet was inflated to 250 mmHg.  A longitudinal incision was then made over the second MTP joint.  Dissection was carried down through the subcutaneous  tissues.  The dorsal joint capsule was incised after lengthening the extensor tendons.  The head of the second metatarsal was exposed.  A Weil osteotomy was made with the oscillating saw all removing a small wedge of bone distally.  The head of the metatarsal was allowed to retract proximally a few millimeters.  The osteotomy was then fixed with a 2.5 mm Biomet FRS screw.  A transverse incision was then made over the PIP joint of the second toe.  Dissection was carried down through the subcutaneous tissues and extensor mechanism.  The head of the proximal phalanx was resected followed by the base of the middle phalanx.  The joint was reduced and fixed with a 3.4 mm Biomet VPC screw.  AP and lateral radiographs confirmed appropriate position and length of all hardware and appropriate shortening of the second metatarsal with arthrodesis of the second toe PIP joint.  The wounds were irrigated copiously.  The extensor tendons were repaired with 3-0 Monocryl.  Subcutaneous tissues were approximated with 3-0 Monocryl.  The skin incisions were closed with 3-0 nylon.  Sterile dressings were applied followed by a compression wrap.  The tourniquet was released after application of the dressings.  The patient was awakened from anesthesia and transported to the recovery room in stable condition.   FOLLOW UP PLAN: Weightbearing as tolerated in a flat postop shoe.  Follow-up in the office in 2 weeks for suture removal and conversion to a Budin splint or taping the toe.  No indication for DVT prophylaxis in this ambulatory patient.   RADIOGRAPHS:  AP and lateral radiographs of the right foot were obtained intraoperatively.  These confirmed appropriate position and  length of all hardware and appropriate shortening of the second metatarsal with arthrodesis of the second toe PIP joint.    Mechele Claude PA-C was present and scrubbed for the duration of the operative case. His assistance was essential in positioning the  patient, prepping and draping, gaining and maintaining exposure, performing the operation, closing and dressing the wounds and applying the splint.

## 2018-06-26 NOTE — Anesthesia Procedure Notes (Signed)
Anesthesia Regional Block: Popliteal block   Pre-Anesthetic Checklist: ,, timeout performed, Correct Patient, Correct Site, Correct Laterality, Correct Procedure, Correct Position, site marked, Risks and benefits discussed, pre-op evaluation,  At surgeon's request and post-op pain management  Laterality: Right  Prep: Maximum Sterile Barrier Precautions used, chloraprep       Needles:  Injection technique: Single-shot  Needle Type: Echogenic Needle     Needle Length: 9cm  Needle Gauge: 21     Additional Needles:   Procedures:,,,, ultrasound used (permanent image in chart),,,,  Narrative:  Start time: 06/26/2018 7:00 AM End time: 06/26/2018 7:08 AM Injection made incrementally with aspirations every 5 mL.  Performed by: Personally  Anesthesiologist: Barnet Glasgow, MD  Additional Notes: Block assessed. Patient tolerated procedure well.

## 2018-06-26 NOTE — Discharge Instructions (Addendum)
Sharon Hewitt, MD [facility]  Please read the following information regarding your care after surgery.  Medications  You only need a prescription for the narcotic pain medicine (ex. oxycodone, Percocet, Norco).  All of the other medicines listed below are available over the counter. X Aleve 2 pills twice a day for the first 3 days after surgery. X acetominophen (Tylenol) 650 mg every 4-6 hours as you need for minor to moderate pain X oxycodone as prescribed for severe pain  Narcotic pain medicine (ex. oxycodone, Percocet, Vicodin) will cause constipation.  To prevent this problem, take the following medicines while you are taking any pain medicine. X docusate sodium (Colace) 100 mg twice a day X senna (Senokot) 2 tablets twice a day  Weight Bearing X Bear weight only on your operated foot in the post-op shoe.   Cast / Splint / Dressing X Keep your splint, cast or dressing clean and dry.  Don't put anything (coat hanger, pencil, etc) down inside of it.  If it gets damp, use a hair dryer on the cool setting to dry it.  If it gets soaked, call the office to schedule an appointment for a cast change.   After your dressing, cast or splint is removed; you may shower, but do not soak or scrub the wound.  Allow the water to run over it, and then gently pat it dry.  Swelling It is normal for you to have swelling where you had surgery.  To reduce swelling and pain, keep your toes above your nose for at least 3 days after surgery.  It may be necessary to keep your foot or leg elevated for several weeks.  If it hurts, it should be elevated.  Follow Up Call my office at [phone] when you are discharged from the hospital or surgery center to schedule an appointment to be seen two weeks after surgery.  Call my office at [phone] if you develop a fever >101.5 F, nausea, vomiting, bleeding from the surgical site or severe pain.     Post Anesthesia Home Care Instructions  Activity: Get  plenty of rest for the remainder of the day. A responsible individual must stay with you for 24 hours following the procedure.  For the next 24 hours, DO NOT: -Drive a car -Operate machinery -Drink alcoholic beverages -Take any medication unless instructed by your physician -Make any legal decisions or sign important papers.  Meals: Start with liquid foods such as gelatin or soup. Progress to regular foods as tolerated. Avoid greasy, spicy, heavy foods. If nausea and/or vomiting occur, drink only clear liquids until the nausea and/or vomiting subsides. Call your physician if vomiting continues.  Special Instructions/Symptoms: Your throat may feel dry or sore from the anesthesia or the breathing tube placed in your throat during surgery. If this causes discomfort, gargle with warm salt water. The discomfort should disappear within 24 hours.  If you had a scopolamine patch placed behind your ear for the management of post- operative nausea and/or vomiting:  1. The medication in the patch is effective for 72 hours, after which it should be removed.  Wrap patch in a tissue and discard in the trash. Wash hands thoroughly with soap and water. 2. You may remove the patch earlier than 72 hours if you experience unpleasant side effects which may include dry mouth, dizziness or visual disturbances. 3. Avoid touching the patch. Wash your hands with soap and water after contact with the patch.  Regional Anesthesia Blocks  1. Numbness or   the inability to move the "blocked" extremity may last from 3-48 hours after placement. The length of time depends on the medication injected and your individual response to the medication. If the numbness is not going away after 48 hours, call your surgeon.  2. The extremity that is blocked will need to be protected until the numbness is gone and the  Strength has returned. Because you cannot feel it, you will need to take extra care to avoid injury. Because it may be  weak, you may have difficulty moving it or using it. You may not know what position it is in without looking at it while the block is in effect.  3. For blocks in the legs and feet, returning to weight bearing and walking needs to be done carefully. You will need to wait until the numbness is entirely gone and the strength has returned. You should be able to move your leg and foot normally before you try and bear weight or walk. You will need someone to be with you when you first try to ensure you do not fall and possibly risk injury.  4. Bruising and tenderness at the needle site are common side effects and will resolve in a few days.  5. Persistent numbness or new problems with movement should be communicated to the surgeon or the [facility] ([phone])/ [facility] ([phone]).        

## 2018-06-27 ENCOUNTER — Encounter (HOSPITAL_BASED_OUTPATIENT_CLINIC_OR_DEPARTMENT_OTHER): Payer: Self-pay | Admitting: Orthopedic Surgery

## 2018-07-10 MED FILL — ESTRADIOL 10 MCG TABS: 10 | 30 days supply | Qty: 18 | Fill #0

## 2018-07-10 MED FILL — ATOMOXETINE HCL 40 MG CAPS: 40 | 30 days supply | Qty: 30 | Fill #0

## 2018-07-18 MED FILL — SYMBICORT 160-4.5 MCG INH: 160-4.5 | 30 days supply | Qty: 10 | Fill #1

## 2018-07-23 MED FILL — LOSARTAN POTASSIUM 25 MG TA: 25 | 30 days supply | Qty: 30 | Fill #0

## 2018-07-25 MED FILL — ROSUVASTATIN CALCIUM 5 MG T: 5 | 90 days supply | Qty: 90 | Fill #0

## 2018-08-13 MED FILL — ATOMOXETINE HCL 40 MG CAPS: 40 | 30 days supply | Qty: 30 | Fill #0

## 2018-08-18 MED FILL — LOSARTAN POTASSIUM 25 MG TA: 25 | 90 days supply | Qty: 90 | Fill #0

## 2018-08-19 ENCOUNTER — Telehealth: Payer: Self-pay | Admitting: *Deleted

## 2018-08-19 DIAGNOSIS — M810 Age-related osteoporosis without current pathological fracture: Secondary | ICD-10-CM

## 2018-08-19 NOTE — Telephone Encounter (Addendum)
Deductible Amount Mret  OOP MAX $4200 ($662met)  Annual exam 04/30/2018 TF  Calcium    9.7         Date 09/27/2018  Upcoming dental procedures   Prior Authorization needed YES denied due to no calcium level reference #[id number]  I explained calcium will be drawn they stated I need to file an appeal (working on it)  [employer] back they said No Prior Auth needed since not oncology related  Reference # [id number]  Pt estimated Cost $215   Appt 09/12/2018 @ 2:00   Coverage Details:20% ONE DOSE,$0 ADMIN FEE

## 2018-08-26 ENCOUNTER — Other Ambulatory Visit: Payer: Medicare Other

## 2018-08-27 ENCOUNTER — Other Ambulatory Visit: Payer: Self-pay

## 2018-08-27 ENCOUNTER — Other Ambulatory Visit: Payer: Medicare Other

## 2018-08-27 DIAGNOSIS — M810 Age-related osteoporosis without current pathological fracture: Secondary | ICD-10-CM

## 2018-08-28 LAB — CALCIUM: Calcium: 9.7 mg/dL (ref 8.6–10.4)

## 2018-09-11 ENCOUNTER — Encounter: Payer: Self-pay | Admitting: Gynecology

## 2018-09-12 ENCOUNTER — Ambulatory Visit (INDEPENDENT_AMBULATORY_CARE_PROVIDER_SITE_OTHER): Payer: Medicare Other | Admitting: Anesthesiology

## 2018-09-12 ENCOUNTER — Other Ambulatory Visit: Payer: Self-pay

## 2018-09-12 DIAGNOSIS — M810 Age-related osteoporosis without current pathological fracture: Secondary | ICD-10-CM

## 2018-09-12 MED ORDER — DENOSUMAB 60 MG/ML ~~LOC~~ SOSY
60.0000 mg | PREFILLED_SYRINGE | Freq: Once | SUBCUTANEOUS | Status: AC
  Administered 2018-09-12: 60 mg via SUBCUTANEOUS

## 2018-09-17 ENCOUNTER — Other Ambulatory Visit: Payer: Self-pay

## 2018-09-17 DIAGNOSIS — Z20822 Contact with and (suspected) exposure to covid-19: Secondary | ICD-10-CM

## 2018-09-18 LAB — NOVEL CORONAVIRUS, NAA: SARS-CoV-2, NAA: NOT DETECTED

## 2018-09-22 MED FILL — SERTRALINE HCL 50 MG TABLET: 50 | 90 days supply | Qty: 90 | Fill #0

## 2018-09-22 MED FILL — ATOMOXETINE HCL 40 MG CAPS: 40 | 30 days supply | Qty: 30 | Fill #1

## 2018-10-15 ENCOUNTER — Encounter: Payer: Self-pay | Admitting: Gynecology

## 2018-10-20 ENCOUNTER — Emergency Department (HOSPITAL_COMMUNITY): Payer: Medicare Other

## 2018-10-20 ENCOUNTER — Encounter (HOSPITAL_COMMUNITY): Payer: Self-pay | Admitting: Emergency Medicine

## 2018-10-20 ENCOUNTER — Emergency Department (HOSPITAL_COMMUNITY)
Admission: EM | Admit: 2018-10-20 | Discharge: 2018-10-20 | Disposition: A | Discharge status: Home / Self Care | Payer: Medicare Other | Attending: Emergency Medicine | Admitting: Emergency Medicine

## 2018-10-20 ENCOUNTER — Other Ambulatory Visit: Payer: Self-pay

## 2018-10-20 DIAGNOSIS — Z87891 Personal history of nicotine dependence: Secondary | ICD-10-CM | POA: Insufficient documentation

## 2018-10-20 DIAGNOSIS — T148XXA Other injury of unspecified body region, initial encounter: Secondary | ICD-10-CM

## 2018-10-20 DIAGNOSIS — Y929 Unspecified place or not applicable: Secondary | ICD-10-CM | POA: Diagnosis not present

## 2018-10-20 DIAGNOSIS — Y999 Unspecified external cause status: Secondary | ICD-10-CM | POA: Insufficient documentation

## 2018-10-20 DIAGNOSIS — S99912A Unspecified injury of left ankle, initial encounter: Secondary | ICD-10-CM | POA: Diagnosis present

## 2018-10-20 DIAGNOSIS — S82892A Other fracture of left lower leg, initial encounter for closed fracture: Secondary | ICD-10-CM

## 2018-10-20 DIAGNOSIS — W109XXA Fall (on) (from) unspecified stairs and steps, initial encounter: Secondary | ICD-10-CM | POA: Diagnosis not present

## 2018-10-20 DIAGNOSIS — S9305XA Dislocation of left ankle joint, initial encounter: Secondary | ICD-10-CM | POA: Diagnosis not present

## 2018-10-20 DIAGNOSIS — J449 Chronic obstructive pulmonary disease, unspecified: Secondary | ICD-10-CM | POA: Diagnosis not present

## 2018-10-20 DIAGNOSIS — Z79899 Other long term (current) drug therapy: Secondary | ICD-10-CM | POA: Diagnosis not present

## 2018-10-20 DIAGNOSIS — Y9301 Activity, walking, marching and hiking: Secondary | ICD-10-CM | POA: Insufficient documentation

## 2018-10-20 DIAGNOSIS — I10 Essential (primary) hypertension: Secondary | ICD-10-CM | POA: Insufficient documentation

## 2018-10-20 LAB — CBC WITH DIFFERENTIAL/PLATELET
Abs Immature Granulocytes: 0.03 10*3/uL (ref 0.00–0.07)
Basophils Absolute: 0 10*3/uL (ref 0.0–0.1)
Basophils Relative: 0 %
Eosinophils Absolute: 0 10*3/uL (ref 0.0–0.5)
Eosinophils Relative: 0 %
HCT: 33.9 % — ABNORMAL LOW (ref 36.0–46.0)
Hemoglobin: 10.9 g/dL — ABNORMAL LOW (ref 12.0–15.0)
Immature Granulocytes: 0 %
Lymphocytes Relative: 12 %
Lymphs Abs: 1.3 10*3/uL (ref 0.7–4.0)
MCH: 33.6 pg (ref 26.0–34.0)
MCHC: 32.2 g/dL (ref 30.0–36.0)
MCV: 104.6 fL — ABNORMAL HIGH (ref 80.0–100.0)
Monocytes Absolute: 0.8 10*3/uL (ref 0.1–1.0)
Monocytes Relative: 7 %
Neutro Abs: 9.1 10*3/uL — ABNORMAL HIGH (ref 1.7–7.7)
Neutrophils Relative %: 81 %
Platelets: 235 10*3/uL (ref 150–400)
RBC: 3.24 MIL/uL — ABNORMAL LOW (ref 3.87–5.11)
RDW: 14.2 % (ref 11.5–15.5)
WBC: 11.2 10*3/uL — ABNORMAL HIGH (ref 4.0–10.5)
nRBC: 0 % (ref 0.0–0.2)

## 2018-10-20 LAB — BASIC METABOLIC PANEL
Anion gap: 8 (ref 5–15)
BUN: 22 mg/dL (ref 8–23)
CO2: 24 mmol/L (ref 22–32)
Calcium: 8.3 mg/dL — ABNORMAL LOW (ref 8.9–10.3)
Chloride: 106 mmol/L (ref 98–111)
Creatinine, Ser: 0.64 mg/dL (ref 0.44–1.00)
GFR calc Af Amer: >60 mL/min (ref >60)
GFR calc non Af Amer: >60 mL/min (ref >60)
Glucose, Bld: 106 mg/dL — ABNORMAL HIGH (ref 70–99)
Potassium: 4.4 mmol/L (ref 3.5–5.1)
Sodium: 138 mmol/L (ref 135–145)

## 2018-10-20 MED ORDER — HYDROMORPHONE HCL 1 MG/ML IJ SOLN
0.5000 mg | INTRAMUSCULAR | Status: DC | PRN
  Administered 2018-10-20 (×2): 0.5 mg via INTRAVENOUS
  Filled 2018-10-20 (×2): qty 1

## 2018-10-20 MED ORDER — KETAMINE HCL 50 MG/5ML IJ SOSY
50.0000 mg | PREFILLED_SYRINGE | Freq: Once | INTRAMUSCULAR | Status: AC
  Administered 2018-10-20: 13:00:00 50 mg via INTRAVENOUS

## 2018-10-20 MED ORDER — ONDANSETRON HCL 4 MG/2ML IJ SOLN
4.0000 mg | Freq: Once | INTRAMUSCULAR | Status: AC
  Administered 2018-10-20: 4 mg via INTRAVENOUS
  Filled 2018-10-20: qty 2

## 2018-10-20 MED ORDER — KETAMINE HCL 50 MG/5ML IJ SOSY
1.0000 mg/kg | PREFILLED_SYRINGE | Freq: Once | INTRAMUSCULAR | Status: DC
  Filled 2018-10-20: qty 10

## 2018-10-20 MED ORDER — KETAMINE HCL 10 MG/ML IJ SOLN
INTRAMUSCULAR | Status: AC | PRN
  Administered 2018-10-20: 25 mg via INTRAVENOUS
  Administered 2018-10-20: 50 mg via INTRAVENOUS
  Administered 2018-10-20: 25 mg via INTRAVENOUS

## 2018-10-20 MED ORDER — PROPOFOL 10 MG/ML IV BOLUS
0.5000 mg/kg | Freq: Once | INTRAVENOUS | Status: AC
  Administered 2018-10-20: 44.5 mg via INTRAVENOUS
  Filled 2018-10-20: qty 20

## 2018-10-20 MED ORDER — KETAMINE HCL 10 MG/ML IJ SOLN: 1.0000 mg/kg | Freq: Once | INTRAMUSCULAR | Status: DC

## 2018-10-20 MED ORDER — OXYCODONE HCL 5 MG PO CAPS: 5.0000 mg | ORAL_CAPSULE | Freq: Four times a day (QID) | ORAL | 0 refills | Status: DC | PRN

## 2018-10-20 MED FILL — oxyCODONE HCL 5 MG TABS: 5 | 3 days supply | Qty: 12 | Fill #0

## 2018-10-20 NOTE — ED Notes (Signed)
Ortho aware of crutches.

## 2018-10-20 NOTE — Discharge Instructions (Signed)
Please read and follow all provided instructions.  Your diagnoses today include:  1. Closed fracture of left ankle, initial encounter   2. Fracture   3. Dislocation of left ankle joint, initial encounter     Tests performed today include:  An x-ray of the affected area -shows ankle fracture/dislocation with appropriate reduction  Vital signs. See below for your results today.   Medications prescribed:   Oxycodone - narcotic pain medication  DO NOT drive or perform any activities that require you to be awake and alert because this medicine can make you drowsy.   Take any prescribed medications only as directed.  Home care instructions:   Follow any educational materials contained in this packet  Follow R.I.C.E. Protocol:  R - rest your injury   I  - use ice on injury without applying directly to skin  C - compress injury with bandage or splint  E - elevate the injury as much as possible  Follow-up instructions: Call Dr. Nona Dell office today or tomorrow for follow-up appointment.  Return instructions:   Please return if your toes or feet are numb or tingling, appear gray or blue, or you have severe pain (also elevate the leg and loosen splint or wrap if you were given one)  Please return to the Emergency Department if you experience worsening symptoms.   Please return if you have any other emergent concerns.  Additional Information:  Your vital signs today were: BP 133/81 (BP Location: Left Arm)    Pulse (!) 103    Temp 98.2 F (36.8 C)    Resp 20    Wt 88.9 kg    SpO2 96%    BMI 30.70 kg/m  If your blood pressure (BP) was elevated above 135/85 this visit, please have this repeated by your doctor within one month. --------------

## 2018-10-20 NOTE — ED Notes (Signed)
Patient would not wait to vital signs.

## 2018-10-20 NOTE — ED Notes (Signed)
Ortho tech x 2 attempted to help patient up with crutches to see about ambulating at home. Per ortho techs, patient is unable to ambulate safely with crutches. They informed Dr. Tacey Heap.

## 2018-10-20 NOTE — ED Notes (Signed)
Patient didn't want to wait for walker and portable potty. However, it came prior to her getting in to the car.

## 2018-10-20 NOTE — ED Triage Notes (Signed)
Per [facility] pt from home for falling down about 10 stairs around 4am today. Has deformity to left ankle. Drank bottle wine last night.  Vitals: 129/80, 100HR, CBG 108, 16R, 96% on 3L via n/c. 18g in right wrist, given Fentanyl 100 mcg in route.

## 2018-10-20 NOTE — ED Provider Notes (Signed)
  Physical Exam  BP 121/84   Pulse (!) 105   Temp 98.2 F (36.8 C)   Resp 13   Wt 88.9 kg   SpO2 92%   BMI 30.70 kg/m   Physical Exam  ED Course/Procedures     .Sedation  Date/Time: 10/20/2018 3:44 PM Performed by: Varney Biles, MD Authorized by: Varney Biles, MD   Consent:    Consent obtained:  Written   Consent given by:  Patient   Risks discussed:  Allergic reaction, dysrhythmia, inadequate sedation, nausea, prolonged hypoxia resulting in organ damage, prolonged sedation necessitating reversal, respiratory compromise necessitating ventilatory assistance and intubation and vomiting   Alternatives discussed:  Analgesia without sedation, anxiolysis and regional anesthesia Universal protocol:    Procedure explained and questions answered to patient or proxy's satisfaction: yes     Relevant documents present and verified: yes     Test results available and properly labeled: yes     Imaging studies available: yes     Required blood products, implants, devices, and special equipment available: yes     Site/side marked: yes     Immediately prior to procedure a time out was called: yes     Patient identity confirmation method:  Verbally with patient Indications:    Procedure necessitating sedation performed by:  Physician performing sedation Pre-sedation assessment:    Time since last food or drink:  10 hours   ASA classification: class 2 - patient with mild systemic disease     Neck mobility: normal     Mouth opening:  3 or more finger widths   Thyromental distance:  4 finger widths   Mallampati score:  I - soft palate, uvula, fauces, pillars visible   Pre-sedation assessments completed and reviewed: airway patency, cardiovascular function, hydration status, mental status, nausea/vomiting, pain level, respiratory function and temperature   Immediate pre-procedure details:    Reassessment: Patient reassessed immediately prior to procedure     Reviewed: vital signs,  relevant labs/tests and NPO status     Verified: bag valve mask available, emergency equipment available, intubation equipment available, IV patency confirmed, oxygen available and suction available   Procedure details (see MAR for exact dosages):    Preoxygenation:  Nasal cannula   Sedation:  Propofol and ketamine   Intended level of sedation: deep   Intra-procedure monitoring:  Blood pressure monitoring, cardiac monitor, continuous pulse oximetry, frequent LOC assessments, frequent vital sign checks and continuous capnometry   Intra-procedure events: none     Total Provider sedation time (minutes):  35 Post-procedure details:    Post-sedation assessment completed:  10/20/2018 3:44 PM   Attendance: Constant attendance by certified staff until patient recovered     Recovery: Patient returned to pre-procedure baseline     Post-sedation assessments completed and reviewed: airway patency, cardiovascular function, hydration status, mental status, nausea/vomiting, pain level, respiratory function and temperature     Patient is stable for discharge or admission: yes     Patient tolerance:  Tolerated well, no immediate complications    MDM   See the attestation note.       Varney Biles, MD 10/21/18 [id number]

## 2018-10-20 NOTE — ED Provider Notes (Signed)
[facility] DEPT Provider Note   CSN: [id number] Arrival date & time: 10/20/18  [id number]     History   Chief Complaint Chief Complaint  Patient presents with   Fall   Ankle Pain    HPI Sharon Daniels is a 68 y.o. female.     Patient with history of PE, COPD, established with Dr. Doran Durand -- presents to the emergency department today with complaint of left ankle injury.  Patient states that she slipped on some stairs while taking out her dog.  Patient states that she was able to crawl up the stairs and call a friend.  She was given fentanyl in route.  Patient denies any injury.  She was placed in a cervical collar but denies hitting her head or having any headache or neck pain.  Also placed in a pillow splint.  No nausea, vomiting, confusion.  Onset of symptoms acute.  Course is constant.     Past Medical History:  Diagnosis Date   Acute pulmonary embolism ([place]) 06/13/2013   CT angio chest 06/12/13   Allergy    Anemia    Arthritis    Complication of anesthesia    "during breast surgery: could hear the surgery, wake up too soon, anxious   COPD (chronic obstructive pulmonary disease) ([place])    Depression    hx of   GERD (gastroesophageal reflux disease)    H/O bronchitis    Hard of hearing    History of kidney stones    x3   Hypertension    Osteoporosis    Pneumonia    hx of 10 years ago   PONV (postoperative nausea and vomiting)    "mild nausea"   Stress incontinence    Substance abuse ([place])    Wears glasses     Patient Active Problem List   Diagnosis Date Noted   Smoker 04/23/2016   Breast hypertrophy 08/04/2015   Osteoporosis 05/12/2015   History of colonic polyps 04/21/2015   DVT, lower extremity, distal, acute ([place]) 06/17/2013   Ileus, postoperative ([place]) 06/17/2013   Narcotic induced constipaiton 06/17/2013   Hypokalemia 06/17/2013   Acute pulmonary embolism ([place]) 06/13/2013   Postoperative  anemia due to acute blood loss 06/09/2013   Transfusion history 06/09/2013   OA (osteoarthritis) of knee 06/05/2013   HIATAL HERNIA 06/18/2007   FATTY LIVER DISEASE 06/18/2007    Past Surgical History:  Procedure Laterality Date   ABDOMINOPLASTY  09/2015   BREAST REDUCTION SURGERY  08/04/2015   BREAST REDUCTION SURGERY Bilateral 08/04/2015   Procedure: MAMMARY REDUCTION  (BREAST);  Surgeon: Crissie Reese, MD;  Location: [facility];  Service: Plastics;  Laterality: Bilateral;   BREAST SURGERY  1985   abscess   CARPAL TUNNEL RELEASE Left    CESAREAN SECTION  1989, [place]  2013   in the office   COSMETIC SURGERY     ear lobes shorten   HAMMERTOE RECONSTRUCTION WITH WEIL OSTEOTOMY Right 06/26/2018   Procedure: Right 2nd toe hammertoe correction; 2nd metatarsal Weil osteotomy;  Surgeon: Wylene Simmer, MD;  Location: [facility];  Service: Orthopedics;  Laterality: Right;  92min   NASAL SINUS SURGERY  2000   TOTAL KNEE ARTHROPLASTY Bilateral 06/05/2013   Procedure: BILATERAL TOTAL KNEE ARTHROPLASTY;  Surgeon: Gearlean Alf, MD;  Location: [facility];  Service: Orthopedics;  Laterality: Bilateral;     OB History    Gravida  3   Para  2   Term      Preterm      AB  1   Living  2     SAB  1   TAB      Ectopic      Multiple      Live Births               Home Medications    Prior to Admission medications   Medication Sig Start Date End Date Taking? Authorizing Provider  Cholecalciferol (VITAMIN D3) 1000 UNITS CAPS Take 10,000 Units by mouth daily.  10/29/11   [provider]  denosumab (PROLIA) 60 MG/ML SOLN injection Inject 60 mg into the skin once. Administer in upper arm, thigh, or abdomen 04/24/16 04/24/16  Terrance Mass, MD  docusate sodium (COLACE) 100 MG capsule Take 1 capsule (100 mg total) by mouth 2 (two) times daily. While taking narcotic pain medicine. 06/26/18   Corky Sing, PA-C    Estradiol 10 MCG TABS vaginal tablet Place 1 tablet (10 mcg total) vaginally 2 (two) times a week. 04/25/17   Fontaine, Belinda Block, MD  fluticasone (FLONASE) 50 MCG/ACT nasal spray Place 1 spray into both nostrils daily as needed (for seasonal allergies). Reported on 04/21/2015 01/16/13   [provider]  losartan (COZAAR) 25 MG tablet TAKE 1 TABLET  25 MG  BY MOUTH DAILY 07/25/16   [provider]  montelukast (SINGULAIR) 10 MG tablet Take 10 mg by mouth daily.    [provider]  Multiple Vitamin (MULTIVITAMIN) tablet Take 1 tablet by mouth daily.    [provider]  omeprazole (PRILOSEC) 40 MG capsule Take 40 mg by mouth daily as needed (for acid reflux or heartburn). Reported on 04/21/2015    [provider]  Propylene Glycol (SYSTANE BALANCE) 0.6 % SOLN Apply 1 drop to eye as needed (Dry eyes). Reported on 04/21/2015    [provider]  rosuvastatin (CRESTOR) 5 MG tablet Take 5 mg by mouth daily. 06/12/16   [provider]  senna (SENOKOT) 8.6 MG TABS tablet Take 2 tablets (17.2 mg total) by mouth 2 (two) times daily. 06/26/18   Corky Sing, PA-C  sertraline (ZOLOFT) 25 MG tablet Take 25 mg by mouth daily.    [provider]  thiamine (VITAMIN B-1) 100 MG tablet Take 100 mg by mouth daily.    [provider]  valsartan (DIOVAN) 80 MG tablet Take 80 mg by mouth every morning.     [provider]    Family History Family History  Problem Relation Age of Onset   Kidney disease Mother    Atrial fibrillation Mother    Breast cancer Mother    Ovarian cancer Mother    Heart attack Father    Colon cancer Neg Hx     Social History Social History   Tobacco Use   Smoking status: Former Smoker    Packs/day: 1.00    Years: 30.00    Pack years: 30.00    Types: Cigarettes    Quit date: 10/19/2017    Years since quitting: 1.0   Smokeless tobacco: Never Used   Tobacco comment: Stop Smoking  Oct..2019  Substance Use Topics   Alcohol use: Yes    Alcohol/week: 0.0 standard drinks    Comment:  Drinks 4 times a week   Drug use: No    Comment: hx of cocaine, none 1982     Allergies   Morphine and related and Morphine  Review of Systems Review of Systems  Constitutional: Negative for activity change.  Musculoskeletal: Positive for arthralgias. Negative for back pain, joint swelling and neck pain.  Skin: Negative for wound.  Neurological: Negative for weakness and numbness.     Physical Exam Updated Vital Signs BP (!) 142/75 (BP Location: Left Arm)    Pulse (!) 102    Temp 98.2 F (36.8 C)    Resp 18    SpO2 95%   Physical Exam Vitals signs and nursing note reviewed.  Constitutional:      Appearance: She is well-developed.  HENT:     Head: Normocephalic and atraumatic.  Eyes:     General:        Right eye: No discharge.        Left eye: No discharge.     Conjunctiva/sclera: Conjunctivae normal.  Neck:     Musculoskeletal: Normal range of motion and neck supple.  Cardiovascular:     Rate and Rhythm: Normal rate and regular rhythm.     Pulses:          Dorsalis pedis pulses are 2+ on the right side and 2+ on the left side.     Heart sounds: Normal heart sounds.  Pulmonary:     Effort: Pulmonary effort is normal. No respiratory distress.     Breath sounds: Normal breath sounds.  Abdominal:     Palpations: Abdomen is soft.     Tenderness: There is no abdominal tenderness.  Musculoskeletal:     Left hip: Normal.     Left knee: Tenderness found.     Left ankle: She exhibits decreased range of motion, swelling and deformity (posterior dislocation of the foot). Tenderness.     Left lower leg: Normal.     Left foot: Normal.  Skin:    General: Skin is warm and dry.  Neurological:     Mental Status: She is alert.      ED Treatments / Results  Labs (all labs ordered are listed, but only abnormal results are displayed) Labs Reviewed  CBC WITH  DIFFERENTIAL/PLATELET - Abnormal; Notable for the following components:      Result Value   WBC 11.2 (*)    RBC 3.24 (*)    Hemoglobin 10.9 (*)    HCT 33.9 (*)    MCV 104.6 (*)    Neutro Abs 9.1 (*)    All other components within normal limits  BASIC METABOLIC PANEL - Abnormal; Notable for the following components:   Glucose, Bld 106 (*)    Calcium 8.3 (*)    All other components within normal limits    EKG EKG Interpretation  Date/Time:  Monday October 20 2018 10:58:21 EDT Ventricular Rate:  100 PR Interval:    QRS Duration: 82 QT Interval:  355 QTC Calculation: 458 R Axis:   -6 Text Interpretation:  Sinus tachycardia Low voltage, precordial leads No acute changes No significant change since last tracing Confirmed by Varney Biles [phone]) on 10/20/2018 12:16:22 PM   Radiology Dg Ankle Complete Left  Result Date: 10/20/2018 CLINICAL DATA:  Pain after fall EXAM: LEFT ANKLE COMPLETE - 3+ VIEW COMPARISON:  None. FINDINGS: There is an ankle dislocation with the tibia and fibula displaced anteriorly relative to the talus. There is mild irregularity in the distal fibula best seen on the lateral view. There is irregularity along the dorsum of the talar neck with no fracture lines extending through the talar neck. There is a fracture through the medial malleolus  which is mildly displaced. IMPRESSION: 1. Dislocation of the ankle with displacement of the tibia and fibula anteriorly relative to the talus. 2. There is a fracture through the medial malleolus with mild displacement. 3. There is mild irregularity at the distal fibula which is age indeterminate. Small fracture fragments are possible. No fracture lines are seen extending through the distal fibula. 4. Irregularity along the dorsum of the talar neck may represent small superimposed fracture fragments from the fibula or tibia or subtle fracture fragments arising from the dorsum of the talar neck. No fracture line is seen extending  through the talus. Electronically Signed   By: Dorise Bullion III M.D   On: 10/20/2018 09:47    Procedures Reduction of fracture  Date/Time: 10/20/2018 3:42 PM Performed by: Carlisle Cater, PA-C Authorized by: Carlisle Cater, PA-C  Consent: Written consent obtained. Consent given by: patient Patient understanding: patient states understanding of the procedure being performed Patient consent: the patient's understanding of the procedure matches consent given Imaging studies: imaging studies available Required items: required blood products, implants, devices, and special equipment available Patient identity confirmed: verbally with patient, arm band, provided demographic data and hospital-assigned identification number Time out: Immediately prior to procedure a "time out" was called to verify the correct patient, procedure, equipment, support staff and site/side marked as required. Local anesthesia used: no  Anesthesia: Local anesthesia used: no  Sedation: Patient sedated: yes (See Dr. Andree Elk note)  Patient tolerance: patient tolerated the procedure well with no immediate complications Comments: Fracture dislocation reduced, maintaining pedal pulses before, during, and after procedure.  Patient splinted at time of reduction.    (including critical care time)  Medications Ordered in ED Medications  HYDROmorphone (DILAUDID) injection 0.5 mg (0.5 mg Intravenous Given 10/20/18 1335)  ondansetron (ZOFRAN) injection 4 mg (4 mg Intravenous Given 10/20/18 0958)  propofol (DIPRIVAN) 10 mg/mL bolus/IV push 44.5 mg (44.5 mg Intravenous Given 10/20/18 1151)  ketamine (KETALAR) injection (25 mg Intravenous Given 10/20/18 1156)  ketamine 50 mg in normal saline 5 mL (10 mg/mL) syringe (50 mg Intravenous Given 10/20/18 1244)     Initial Impression / Assessment and Plan / ED Course  I have reviewed the triage vital signs and the nursing notes.  Pertinent labs & imaging results that  were available during my care of the patient were reviewed by me and considered in my medical decision making (see chart for details).        Patient seen and examined. Work-up initiated. Medications ordered. Discussed with Dr. Kathrynn Humble as patient appears to have dislocation which will need reduced.   Vital signs reviewed and are as follows: BP (!) 142/75 (BP Location: Left Arm)    Pulse (!) 102    Temp 98.2 F (36.8 C)    Resp 18    SpO2 95%   9:57 AM X-ray reviewed.   Sedation and reduction performed without complication.  Postreduction films look good.  I reviewed personally.   I discussed the case with Dr. Wynelle Link who is in the operating room.  Recommends follow-up as outpatient.  3:45 PM patient rechecked and is comfortable.  Patient was unable to ambulate with crutches here in the ED.  Discussed admission versus discharge with patient.  She is motivated to go home and wants to go home.  I spoke with case Freight forwarder.  We have obtained a knee scooter for the patient and she will pick up at home health.  Patient will be given pain medication for home.  I have  also completed face-to-face order for home health, PT, OT, nursing, social work in conjunction with case manager who has been involved in the patient's care today.  Patient states that she has a ride coming for her at 4 PM.  Patient counseled on use of narcotic pain medications. Counseled not to combine these medications with others containing tylenol. Urged not to drink alcohol, drive, or perform any other activities that requires focus while taking these medications. The patient verbalizes understanding and agrees with the plan.   Final Clinical Impressions(s) / ED Diagnoses   Final diagnoses:  Closed fracture of left ankle, initial encounter  Dislocation of left ankle joint, initial encounter   Patient with a slip and fall this morning with closed fracture dislocation of the left ankle as above.  This was reduced in the emergency  department.  Mobility is a concern for the patient.  She lives alone.  She wants to go home.  She has a neighbor who can help her.  Reports managing all right after foot surgery over the summer.  She will follow-up with her orthopedic doctor.   ED Discharge Orders         [place]     10/20/18 1525    Face-to-face encounter (required for Medicare/Medicaid patients)    Comments: I Carlisle Cater certify that this patient is under my care and that I, or a nurse practitioner or physician's assistant working with me, had a face-to-face encounter that meets the physician face-to-face encounter requirements with this patient on 10/20/2018. The encounter with the patient was in whole, or in part for the following medical condition(s) which is the primary reason for home health care (List medical condition): complex ankle fracture   10/20/18 1525    oxycodone (OXY-IR) 5 MG capsule  Every 6 hours PRN     10/20/18 1527           Carlisle Cater, PA-C 10/20/18 [place], [place], MD 10/21/18 [phone]

## 2018-10-20 NOTE — TOC Initial Note (Signed)
Transition of Care [facility]) - Initial/Assessment Note    Patient Details  Name: Sharon Daniels MRN: [id number] Date of Birth: [birth year]  Transition of [facility]) CM/SW Contact:    Erenest Rasher, RN Phone Number: [phone] 10/20/2018, 4:00 PM  Clinical Narrative:                 Spoke to her at bedside. States she has friends that can assist her at home. She can hire a caregiver if needed. [place] for RW and 3n1 bedside commode to be deliver to room. Pt will go by [employer] store to pick up a knee walker. Provided her with RX, unsure if the Knee Gilford Rile is covered by insurance. Explained [facility] closes at 5 pm. Provided her with retail store address. Offered choice for HH, Pt agreeable to [facility]. Contacted rep, Ronalee Belts with new referral. They accepted referral.   Expected Discharge Plan: [place] Barriers to Discharge: No Barriers Identified   Patient Goals and CMS Choice Patient states their goals for this hospitalization and ongoing recovery are:: be able to manage at home CMS Medicare.gov Compare Post Acute Care list provided to:: Patient Choice offered to / list presented to : Patient  Expected Discharge Plan and Services Expected Discharge Plan: [place]   Discharge Planning Services: CM Consult Post Acute Care Choice: [place] arrangements for the past 2 months: Apartment                 DME Arranged: 3-N-1, Walker rolling DME Agency: [employer] Date DME Agency Contacted: 10/20/18 Time DME Agency Contacted: 1500 Representative spoke with at DME Agency: [place]: PT, OT, Nurse's Aide, Social Work [employer] Agency: [facility] [employer] [facility] [employer] Date [place]: 10/20/18 Time [place]: 1500 Representative spoke with at [place]: Burlene Arnt  Prior Living Arrangements/Services Living arrangements for the past 2 months:  Apartment Lives with:: Self Patient language and need for interpreter reviewed:: Yes Do you feel safe going back to the place where you live?: Yes      Need for Family Participation in Patient Care: Yes (Comment) Care giver support system in place?: Yes (comment)   Criminal Activity/Legal Involvement Pertinent to Current Situation/Hospitalization: No - Comment as needed  Activities of Daily Living      Permission Sought/Granted Permission sought to share information with : Case Manager, PCP, Family Supports Permission granted to share information with : Yes, Verbal Permission Granted  Share Information with NAME: [place]  Permission granted to share info w AGENCY: [facility], [place] granted to share info w Relationship: friend  Permission granted to share info w Contact Information: [phone]  Emotional Assessment Appearance:: Appears stated age Attitude/Demeanor/Rapport: Gracious Affect (typically observed): Accepting Orientation: : Oriented to Place, Oriented to  Time, Oriented to Self, Oriented to Situation Alcohol / Substance Use: Alcohol Use Psych Involvement: No (comment)  Admission diagnosis:  fall ankle deformity Patient Active Problem List   Diagnosis Date Noted  . Smoker 04/23/2016  . Breast hypertrophy 08/04/2015  . Osteoporosis 05/12/2015  . History of colonic polyps 04/21/2015  . DVT, lower extremity, distal, acute ([place]) 06/17/2013  . Ileus, postoperative ([place]) 06/17/2013  . 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 Hypokalemia 06/17/2013  . Acute pulmonary embolism ([place]) 06/13/2013  . Postoperative anemia due to acute blood loss 06/09/2013  . Transfusion history 06/09/2013  .  OA (osteoarthritis) of knee 06/05/2013  . HIATAL HERNIA 06/18/2007  . FATTY LIVER DISEASE 06/18/2007   PCP:  [name], MD Pharmacy:   [place], [place] - [address] [facility] [address]  [place] [address] Phone: [phone] Fax: [phone]     Social Determinants of Health (SDOH) Interventions    Readmission Risk Interventions No flowsheet data found.

## 2018-10-20 NOTE — ED Notes (Signed)
11:50

## 2018-10-22 ENCOUNTER — Other Ambulatory Visit (HOSPITAL_COMMUNITY): Payer: Self-pay | Admitting: Orthopedic Surgery

## 2018-10-24 ENCOUNTER — Other Ambulatory Visit (HOSPITAL_COMMUNITY)
Admission: RE | Admit: 2018-10-24 | Discharge: 2018-10-24 | Disposition: A | Discharge status: Home / Self Care | Payer: Medicare Other | Source: Ambulatory Visit | Attending: Orthopedic Surgery | Admitting: Orthopedic Surgery

## 2018-10-24 ENCOUNTER — Other Ambulatory Visit (HOSPITAL_COMMUNITY): Payer: Medicare Other

## 2018-10-24 DIAGNOSIS — Z20828 Contact with and (suspected) exposure to other viral communicable diseases: Secondary | ICD-10-CM | POA: Diagnosis present

## 2018-10-24 MED FILL — MONTELUKAST SOD 10 MG TAB: 10 | 90 days supply | Qty: 90 | Fill #1

## 2018-10-24 MED FILL — ROSUVASTATIN CALCIUM 5 MG T: 5 | 90 days supply | Qty: 90 | Fill #0

## 2018-10-24 MED FILL — ATOMOXETINE HCL 40 MG CAPS: 40 | 30 days supply | Qty: 30 | Fill #2

## 2018-10-24 NOTE — Telephone Encounter (Signed)
PROLIA GIVEN 09/12/2018 NEXT INJECTION 03/13/2019

## 2018-10-26 LAB — NOVEL CORONAVIRUS, NAA (HOSP ORDER, SEND-OUT TO REF LAB; TAT 18-24 HRS): SARS-CoV-2, NAA: NOT DETECTED

## 2018-10-27 ENCOUNTER — Encounter (HOSPITAL_COMMUNITY): Payer: Self-pay | Admitting: *Deleted

## 2018-10-27 ENCOUNTER — Encounter: Payer: Self-pay | Admitting: Gynecology

## 2018-10-27 NOTE — Anesthesia Preprocedure Evaluation (Addendum)
Anesthesia Evaluation  Patient identified by MRN, date of birth, ID band Patient awake    Reviewed: Allergy & Precautions, NPO status , Patient's Chart, lab work & pertinent test results  History of Anesthesia Complications (+) PONV and history of anesthetic complications  Airway Mallampati: II  TM Distance: >3 FB Neck ROM: Full    Dental no notable dental hx. (+) Teeth Intact   Pulmonary COPD, former smoker,    Pulmonary exam normal breath sounds clear to auscultation       Cardiovascular Exercise Tolerance: Good hypertension, Pt. on medications Normal cardiovascular exam Rhythm:Regular Rate:Normal  10/21/18 EKG  ST   Neuro/Psych PSYCHIATRIC DISORDERS Depression    GI/Hepatic GERD  ,  Endo/Other    Renal/GU K+ 4.4 Cr 0.62     Musculoskeletal   Abdominal   Peds  Hematology  (+) anemia , Hgb 10.9 Plt 235   Anesthesia Other Findings   Reproductive/Obstetrics                            Anesthesia Physical Anesthesia Plan  ASA: II  Anesthesia Plan: General   Post-op Pain Management:    Induction: Intravenous  PONV Risk Score and Plan: TIVA, Treatment may vary due to age or medical condition, Ondansetron and Dexamethasone  Airway Management Planned: LMA  Additional Equipment: None  Intra-op Plan:   Post-operative Plan:   Informed Consent: I have reviewed the patients History and Physical, chart, labs and discussed the procedure including the risks, benefits and alternatives for the proposed anesthesia with the patient or authorized representative who has indicated his/her understanding and acceptance.     Dental advisory given  Plan Discussed with:   Anesthesia Plan Comments: ([facility] w L Pop and Adductor canal blocks)       Anesthesia Quick Evaluation

## 2018-10-27 NOTE — Progress Notes (Signed)
Denies chest pain, shob, or cardiology visit. Reports she has been quarteine as directed since COVID test. Educated on visitation policy.

## 2018-10-28 ENCOUNTER — Other Ambulatory Visit: Payer: Self-pay

## 2018-10-28 ENCOUNTER — Encounter (HOSPITAL_COMMUNITY)
Admission: RE | Disposition: A | Discharge status: Home / Self Care | Payer: Self-pay | Source: Home / Self Care | Attending: Orthopedic Surgery

## 2018-10-28 ENCOUNTER — Ambulatory Visit (HOSPITAL_COMMUNITY): Payer: Medicare Other | Admitting: Anesthesiology

## 2018-10-28 ENCOUNTER — Ambulatory Visit (HOSPITAL_COMMUNITY)
Admission: RE | Admit: 2018-10-28 | Discharge: 2018-10-28 | Disposition: A | Discharge status: Home / Self Care | Payer: Medicare Other | Attending: Orthopedic Surgery | Admitting: Orthopedic Surgery

## 2018-10-28 ENCOUNTER — Encounter (HOSPITAL_COMMUNITY): Payer: Self-pay | Admitting: *Deleted

## 2018-10-28 DIAGNOSIS — Z79899 Other long term (current) drug therapy: Secondary | ICD-10-CM | POA: Insufficient documentation

## 2018-10-28 DIAGNOSIS — S82852A Displaced trimalleolar fracture of left lower leg, initial encounter for closed fracture: Secondary | ICD-10-CM | POA: Diagnosis present

## 2018-10-28 DIAGNOSIS — W19XXXA Unspecified fall, initial encounter: Secondary | ICD-10-CM | POA: Diagnosis not present

## 2018-10-28 DIAGNOSIS — J449 Chronic obstructive pulmonary disease, unspecified: Secondary | ICD-10-CM | POA: Insufficient documentation

## 2018-10-28 DIAGNOSIS — Z86718 Personal history of other venous thrombosis and embolism: Secondary | ICD-10-CM | POA: Diagnosis not present

## 2018-10-28 DIAGNOSIS — Z86711 Personal history of pulmonary embolism: Secondary | ICD-10-CM | POA: Diagnosis not present

## 2018-10-28 DIAGNOSIS — Z87891 Personal history of nicotine dependence: Secondary | ICD-10-CM | POA: Diagnosis not present

## 2018-10-28 DIAGNOSIS — F909 Attention-deficit hyperactivity disorder, unspecified type: Secondary | ICD-10-CM | POA: Diagnosis not present

## 2018-10-28 DIAGNOSIS — M1990 Unspecified osteoarthritis, unspecified site: Secondary | ICD-10-CM | POA: Diagnosis not present

## 2018-10-28 DIAGNOSIS — Z7951 Long term (current) use of inhaled steroids: Secondary | ICD-10-CM | POA: Insufficient documentation

## 2018-10-28 DIAGNOSIS — S82842A Displaced bimalleolar fracture of left lower leg, initial encounter for closed fracture: Secondary | ICD-10-CM | POA: Diagnosis not present

## 2018-10-28 DIAGNOSIS — S82872A Displaced pilon fracture of left tibia, initial encounter for closed fracture: Secondary | ICD-10-CM | POA: Insufficient documentation

## 2018-10-28 DIAGNOSIS — I10 Essential (primary) hypertension: Secondary | ICD-10-CM | POA: Diagnosis not present

## 2018-10-28 DIAGNOSIS — F329 Major depressive disorder, single episode, unspecified: Secondary | ICD-10-CM | POA: Insufficient documentation

## 2018-10-28 DIAGNOSIS — K219 Gastro-esophageal reflux disease without esophagitis: Secondary | ICD-10-CM | POA: Diagnosis not present

## 2018-10-28 HISTORY — PX: ORIF ANKLE FRACTURE: SHX5408

## 2018-10-28 HISTORY — DX: Attention-deficit hyperactivity disorder, unspecified type: F90.9

## 2018-10-28 SURGERY — OPEN REDUCTION INTERNAL FIXATION (ORIF) ANKLE FRACTURE
Anesthesia: General | Site: Ankle | Laterality: Left

## 2018-10-28 MED ORDER — LACTATED RINGERS IV SOLN
INTRAVENOUS | Status: DC | PRN
  Administered 2018-10-28: 07:00:00 via INTRAVENOUS

## 2018-10-28 MED ORDER — ACETAMINOPHEN 10 MG/ML IV SOLN: 1000.0000 mg | Freq: Once | INTRAVENOUS | Status: DC | PRN

## 2018-10-28 MED ORDER — OXYCODONE HCL 5 MG PO TABS: 5.0000 mg | ORAL_TABLET | ORAL | 0 refills | Status: AC | PRN

## 2018-10-28 MED ORDER — MIDAZOLAM HCL 2 MG/2ML IJ SOLN
INTRAMUSCULAR | Status: AC
  Filled 2018-10-28: qty 2

## 2018-10-28 MED ORDER — FENTANYL CITRATE (PF) 100 MCG/2ML IJ SOLN
INTRAMUSCULAR | Status: DC | PRN
  Administered 2018-10-28: 50 ug via INTRAVENOUS

## 2018-10-28 MED ORDER — ALBUMIN HUMAN 5 % IV SOLN
INTRAVENOUS | Status: AC
  Filled 2018-10-28: qty 250

## 2018-10-28 MED ORDER — DEXAMETHASONE SODIUM PHOSPHATE 10 MG/ML IJ SOLN
INTRAMUSCULAR | Status: AC
  Filled 2018-10-28: qty 1

## 2018-10-28 MED ORDER — ONDANSETRON HCL 4 MG/2ML IJ SOLN
INTRAMUSCULAR | Status: DC | PRN
  Administered 2018-10-28: 4 mg via INTRAVENOUS

## 2018-10-28 MED ORDER — MIDAZOLAM HCL 5 MG/5ML IJ SOLN
INTRAMUSCULAR | Status: DC | PRN
  Administered 2018-10-28: 2 mg via INTRAVENOUS

## 2018-10-28 MED ORDER — CHLORHEXIDINE GLUCONATE 4 % EX LIQD: 60.0000 mL | Freq: Once | CUTANEOUS | Status: DC

## 2018-10-28 MED ORDER — ROPIVACAINE HCL 5 MG/ML IJ SOLN
INTRAMUSCULAR | Status: DC | PRN
  Administered 2018-10-28: 30 mL via PERINEURAL
  Administered 2018-10-28: 15 mL via INTRATHECAL

## 2018-10-28 MED ORDER — ENOXAPARIN SODIUM 40 MG/0.4ML ~~LOC~~ SOLN: 40.0000 mg | SUBCUTANEOUS | 0 refills | Status: DC

## 2018-10-28 MED ORDER — PROPOFOL 1000 MG/100ML IV EMUL
INTRAVENOUS | Status: AC
  Filled 2018-10-28: qty 100

## 2018-10-28 MED ORDER — LACTATED RINGERS IV BOLUS
1000.0000 mL | Freq: Once | INTRAVENOUS | Status: AC
  Administered 2018-10-28: 1000 mL via INTRAVENOUS

## 2018-10-28 MED ORDER — VANCOMYCIN HCL 500 MG IV SOLR
INTRAVENOUS | Status: AC
  Filled 2018-10-28: qty 500

## 2018-10-28 MED ORDER — CEFAZOLIN SODIUM-DEXTROSE 2-4 GM/100ML-% IV SOLN
2.0000 g | INTRAVENOUS | Status: AC
  Administered 2018-10-28: 2 g via INTRAVENOUS
  Filled 2018-10-28: qty 100

## 2018-10-28 MED ORDER — SODIUM CHLORIDE 0.9 % IV SOLN: INTRAVENOUS | Status: DC

## 2018-10-28 MED ORDER — LIDOCAINE 2% (20 MG/ML) 5 ML SYRINGE
INTRAMUSCULAR | Status: DC | PRN
  Administered 2018-10-28: 80 mg via INTRAVENOUS

## 2018-10-28 MED ORDER — PROPOFOL 500 MG/50ML IV EMUL
INTRAVENOUS | Status: DC | PRN
  Administered 2018-10-28: 75 ug/kg/min via INTRAVENOUS

## 2018-10-28 MED ORDER — DEXMEDETOMIDINE HCL 200 MCG/2ML IV SOLN
INTRAVENOUS | Status: DC | PRN
  Administered 2018-10-28 (×4): 20 ug via INTRAVENOUS

## 2018-10-28 MED ORDER — DEXMEDETOMIDINE HCL IN NACL 200 MCG/50ML IV SOLN
INTRAVENOUS | Status: AC
  Filled 2018-10-28: qty 50

## 2018-10-28 MED ORDER — 0.9 % SODIUM CHLORIDE (POUR BTL) OPTIME
TOPICAL | Status: DC | PRN
  Administered 2018-10-28: 1000 mL

## 2018-10-28 MED ORDER — FENTANYL CITRATE (PF) 100 MCG/2ML IJ SOLN: 25.0000 ug | INTRAMUSCULAR | Status: DC | PRN

## 2018-10-28 MED ORDER — ONDANSETRON HCL 4 MG/2ML IJ SOLN: 4.0000 mg | Freq: Once | INTRAMUSCULAR | Status: DC | PRN

## 2018-10-28 MED ORDER — CLONIDINE HCL (ANALGESIA) 100 MCG/ML EP SOLN
EPIDURAL | Status: DC | PRN
  Administered 2018-10-28 (×2): 100 ug

## 2018-10-28 MED ORDER — DOCUSATE SODIUM 100 MG PO CAPS: 100.0000 mg | ORAL_CAPSULE | Freq: Two times a day (BID) | ORAL | 0 refills | Status: DC

## 2018-10-28 MED ORDER — PROPOFOL 10 MG/ML IV BOLUS
INTRAVENOUS | Status: DC | PRN
  Administered 2018-10-28: 200 mg via INTRAVENOUS

## 2018-10-28 MED ORDER — SENNA 8.6 MG PO TABS: 2.0000 | ORAL_TABLET | Freq: Two times a day (BID) | ORAL | 0 refills | Status: DC

## 2018-10-28 MED ORDER — ONDANSETRON HCL 4 MG/2ML IJ SOLN
INTRAMUSCULAR | Status: AC
  Filled 2018-10-28: qty 2

## 2018-10-28 MED ORDER — SODIUM CHLORIDE 0.9 % IV SOLN
INTRAVENOUS | Status: DC | PRN
  Administered 2018-10-28: 50 ug/min via INTRAVENOUS

## 2018-10-28 MED ORDER — PROPOFOL 10 MG/ML IV BOLUS
INTRAVENOUS | Status: AC
  Filled 2018-10-28: qty 20

## 2018-10-28 MED ORDER — FENTANYL CITRATE (PF) 250 MCG/5ML IJ SOLN
INTRAMUSCULAR | Status: AC
  Filled 2018-10-28: qty 5

## 2018-10-28 MED ORDER — LIDOCAINE 2% (20 MG/ML) 5 ML SYRINGE
INTRAMUSCULAR | Status: AC
  Filled 2018-10-28: qty 5

## 2018-10-28 MED ORDER — PHENYLEPHRINE 40 MCG/ML (10ML) SYRINGE FOR IV PUSH (FOR BLOOD PRESSURE SUPPORT)
PREFILLED_SYRINGE | INTRAVENOUS | Status: DC | PRN
  Administered 2018-10-28: 120 ug via INTRAVENOUS
  Administered 2018-10-28 (×2): 80 ug via INTRAVENOUS
  Administered 2018-10-28: 120 ug via INTRAVENOUS

## 2018-10-28 MED ORDER — ALBUMIN HUMAN 5 % IV SOLN
12.5000 g | Freq: Once | INTRAVENOUS | Status: AC
  Administered 2018-10-28: 12.5 g via INTRAVENOUS

## 2018-10-28 MED ORDER — VANCOMYCIN HCL 500 MG IV SOLR
INTRAVENOUS | Status: DC | PRN
  Administered 2018-10-28: 500 mg

## 2018-10-28 MED ORDER — DEXAMETHASONE SODIUM PHOSPHATE 10 MG/ML IJ SOLN
INTRAMUSCULAR | Status: DC | PRN
  Administered 2018-10-28: 10 mg via INTRAVENOUS

## 2018-10-28 MED FILL — ENOXAPARIN 40 MG/0.4 ML SYR: 40 | 15 days supply | Qty: 6 | Fill #0

## 2018-10-28 MED FILL — oxyCODONE HCL 5 MG TABS: 5 | 5 days supply | Qty: 30 | Fill #0

## 2018-10-28 SURGICAL SUPPLY — 70 items
ALCOHOL 70% 16 OZ (MISCELLANEOUS) ×2 IMPLANT
ANCH SUT 1 SHRT SM RGD INSRTR (Anchor) ×2 IMPLANT
ANCHOR SUT 1.45 SZ 1 SHORT (Anchor) ×2 IMPLANT
APL PRP STRL LF DISP 70% ISPRP (MISCELLANEOUS) ×2
BANDAGE ESMARK 6X9 LF (GAUZE/BANDAGES/DRESSINGS) ×1 IMPLANT
BIT DRILL 2.5X2.75 QC CALB (BIT) ×1 IMPLANT
BLADE SURG 15 STRL LF DISP TIS (BLADE) ×1 IMPLANT
BLADE SURG 15 STRL SS (BLADE) ×2
BNDG CMPR 9X6 STRL LF SNTH (GAUZE/BANDAGES/DRESSINGS) ×1
BNDG CMPR MED 10X6 ELC LF (GAUZE/BANDAGES/DRESSINGS) ×1
BNDG COHESIVE 4X5 TAN STRL (GAUZE/BANDAGES/DRESSINGS) ×2 IMPLANT
BNDG COHESIVE 6X5 TAN STRL LF (GAUZE/BANDAGES/DRESSINGS) ×2 IMPLANT
BNDG ELASTIC 4X5.8 VLCR STR LF (GAUZE/BANDAGES/DRESSINGS) ×1 IMPLANT
BNDG ELASTIC 6X10 VLCR STRL LF (GAUZE/BANDAGES/DRESSINGS) ×1 IMPLANT
BNDG ESMARK 6X9 LF (GAUZE/BANDAGES/DRESSINGS) ×2
CANISTER SUCT 3000ML PPV (MISCELLANEOUS) ×2 IMPLANT
CHLORAPREP W/TINT 26 (MISCELLANEOUS) ×4 IMPLANT
COVER SURGICAL LIGHT HANDLE (MISCELLANEOUS) ×2 IMPLANT
COVER WAND RF STERILE (DRAPES) ×2 IMPLANT
CUFF TOURN SGL QUICK 34 (TOURNIQUET CUFF) ×2
CUFF TOURN SGL QUICK 42 (TOURNIQUET CUFF) IMPLANT
CUFF TRNQT CYL 34X4.125X (TOURNIQUET CUFF) ×1 IMPLANT
DRAPE OEC MINIVIEW 54X84 (DRAPES) ×2 IMPLANT
DRAPE U-SHAPE 47X51 STRL (DRAPES) ×2 IMPLANT
DRSG MEPITEL 4X7.2 (GAUZE/BANDAGES/DRESSINGS) ×2 IMPLANT
DRSG PAD ABDOMINAL 8X10 ST (GAUZE/BANDAGES/DRESSINGS) ×4 IMPLANT
ELECT REM PT RETURN 9FT ADLT (ELECTROSURGICAL) ×2
ELECTRODE REM PT RTRN 9FT ADLT (ELECTROSURGICAL) ×1 IMPLANT
GAUZE SPONGE 4X4 12PLY STRL (GAUZE/BANDAGES/DRESSINGS) IMPLANT
GAUZE SPONGE 4X4 12PLY STRL LF (GAUZE/BANDAGES/DRESSINGS) ×1 IMPLANT
GLOVE BIO SURGEON STRL SZ8 (GLOVE) ×2 IMPLANT
GLOVE BIOGEL PI IND STRL 8 (GLOVE) ×1 IMPLANT
GLOVE BIOGEL PI INDICATOR 8 (GLOVE) ×1
GLOVE ECLIPSE 8.0 STRL XLNG CF (GLOVE) ×2 IMPLANT
GOWN STRL REUS W/ TWL LRG LVL3 (GOWN DISPOSABLE) ×1 IMPLANT
GOWN STRL REUS W/ TWL XL LVL3 (GOWN DISPOSABLE) ×2 IMPLANT
GOWN STRL REUS W/TWL LRG LVL3 (GOWN DISPOSABLE) ×2
GOWN STRL REUS W/TWL XL LVL3 (GOWN DISPOSABLE) ×4
K-WIRE ACE 1.6X6 (WIRE) ×4
KIT BASIN OR (CUSTOM PROCEDURE TRAY) ×2 IMPLANT
KIT TURNOVER KIT B (KITS) ×2 IMPLANT
KWIRE ACE 1.6X6 (WIRE) IMPLANT
NS IRRIG 1000ML POUR BTL (IV SOLUTION) ×2 IMPLANT
PACK ORTHO EXTREMITY (CUSTOM PROCEDURE TRAY) ×2 IMPLANT
PAD ARMBOARD 7.5X6 YLW CONV (MISCELLANEOUS) ×4 IMPLANT
PAD CAST 4YDX4 CTTN HI CHSV (CAST SUPPLIES) ×1 IMPLANT
PADDING CAST COTTON 4X4 STRL (CAST SUPPLIES) ×4
PADDING CAST COTTON 6X4 STRL (CAST SUPPLIES) ×1 IMPLANT
PLATE ACE 100DEG 8HOLE (Plate) ×1 IMPLANT
SCREW ACE CAN 4.0 50M (Screw) ×1 IMPLANT
SCREW CORTICAL 3.5MM  44MM (Screw) ×1 IMPLANT
SCREW CORTICAL 3.5MM 38MM (Screw) ×1 IMPLANT
SCREW CORTICAL 3.5MM 40MM (Screw) ×1 IMPLANT
SCREW CORTICAL 3.5MM 44MM (Screw) IMPLANT
SPLINT PLASTER CAST XFAST 5X30 (CAST SUPPLIES) IMPLANT
SPLINT PLASTER XFAST SET 5X30 (CAST SUPPLIES) ×2
SPONGE LAP 18X18 RF (DISPOSABLE) ×2 IMPLANT
SUCTION FRAZIER HANDLE 10FR (MISCELLANEOUS) ×1
SUCTION TUBE FRAZIER 10FR DISP (MISCELLANEOUS) ×1 IMPLANT
SUT ETHILON 3 0 PS 1 (SUTURE) ×4 IMPLANT
SUT MNCRL AB 3-0 PS2 18 (SUTURE) ×1 IMPLANT
SUT VIC AB 0 CT1 27 (SUTURE) ×2
SUT VIC AB 0 CT1 27XBRD ANBCTR (SUTURE) IMPLANT
SUT VIC AB 2-0 CT1 27 (SUTURE) ×4
SUT VIC AB 2-0 CT1 TAPERPNT 27 (SUTURE) ×2 IMPLANT
TOWEL GREEN STERILE (TOWEL DISPOSABLE) ×2 IMPLANT
TOWEL GREEN STERILE FF (TOWEL DISPOSABLE) ×2 IMPLANT
TUBE CONNECTING 12X1/4 (SUCTIONS) ×2 IMPLANT
WASHER FLAT ACE (Orthopedic Implant) ×1 IMPLANT
WASHER PLAIN FLAT ACE NS 3PK (Orthopedic Implant) IMPLANT

## 2018-10-28 NOTE — Discharge Instructions (Addendum)
Sharon Simmer, MD [facility]  Please read the following information regarding your care after surgery.  Medications  You only need a prescription for the narcotic pain medicine (ex. oxycodone, Percocet, Norco).  All of the other medicines listed below are available over the counter. X Aleve 2 pills twice a day for the first 3 days after surgery. X acetominophen (Tylenol) 650 mg every 4-6 hours as you need for minor to moderate pain X oxycodone as prescribed for severe pain  Narcotic pain medicine (ex. oxycodone, Percocet, Vicodin) will cause constipation.  To prevent this problem, take the following medicines while you are taking any pain medicine. X docusate sodium (Colace) 100 mg twice a day X senna (Senokot) 2 tablets twice a day  X To help prevent blood clots, inject lovenox as prescribed for two weeks after surgery.  You should also get up every hour while you are awake to move around.    Weight Bearing X Do not bear any weight on the operated leg or foot.  Cast / Splint / Dressing X Keep your splint, cast or dressing clean and dry.  Dont put anything (coat hanger, pencil, etc) down inside of it.  If it gets damp, use a hair dryer on the cool setting to dry it.  If it gets soaked, call the office to schedule an appointment for a cast change.   After your dressing, cast or splint is removed; you may shower, but do not soak or scrub the wound.  Allow the water to run over it, and then gently pat it dry.  Swelling It is normal for you to have swelling where you had surgery.  To reduce swelling and pain, keep your toes above your nose for at least 3 days after surgery.  It may be necessary to keep your foot or leg elevated for several weeks.  If it hurts, it should be elevated.  Follow Up Call my office at [phone] when you are discharged from the hospital or surgery center to schedule an appointment to be seen two weeks after surgery.  Call my office at [phone] if you  develop a fever >101.5 F, nausea, vomiting, bleeding from the surgical site or severe pain.

## 2018-10-28 NOTE — Anesthesia Procedure Notes (Signed)
Procedure Name: LMA Insertion Date/Time: 10/28/2018 7:27 AM Performed by: Kyung Rudd, CRNA Pre-anesthesia Checklist: Patient identified, Emergency Drugs available, Suction available and Patient being monitored Patient Re-evaluated:Patient Re-evaluated prior to induction Oxygen Delivery Method: Circle system utilized Preoxygenation: Pre-oxygenation with 100% oxygen Induction Type: IV induction LMA: LMA inserted LMA Size: 4.0 Number of attempts: 1 Placement Confirmation: positive ETCO2 and breath sounds checked- equal and bilateral Tube secured with: Tape Dental Injury: Teeth and Oropharynx as per pre-operative assessment

## 2018-10-28 NOTE — Anesthesia Procedure Notes (Addendum)
Anesthesia Regional Block: Popliteal block   Pre-Anesthetic Checklist: ,, timeout performed, Correct Patient, Correct Site, Correct Laterality, Correct Procedure, Correct Position, site marked, Risks and benefits discussed, pre-op evaluation,  At surgeon's request and post-op pain management  Laterality: Left  Prep: Maximum Sterile Barrier Precautions used, chloraprep       Needles:  Injection technique: Single-shot  Needle Type: Echogenic Needle     Needle Length: 9cm  Needle Gauge: 21     Additional Needles:   Procedures:,,,, ultrasound used (permanent image in chart),,,,  Narrative:  Start time: 10/28/2018 6:45 AM End time: 10/28/2018 6:53 AM Injection made incrementally with aspirations every 5 mL.  Performed by: Personally  Anesthesiologist: Barnet Glasgow, MD  Additional Notes: Block assessed. Patient tolerated procedure well.

## 2018-10-28 NOTE — Anesthesia Postprocedure Evaluation (Signed)
Anesthesia Post Note  Patient: Sharon Daniels  Procedure(s) Performed: OPEN REDUCTION INTERNAL FIXATION (ORIF) LEFT ANKLE BIMALLEOLAR FRACTURE DISLOCATION (Left Ankle)     Patient location during evaluation: PACU Anesthesia Type: General Level of consciousness: awake and alert Pain management: pain level controlled Vital Signs Assessment: post-procedure vital signs reviewed and stable Respiratory status: spontaneous breathing, nonlabored ventilation, respiratory function stable and patient connected to nasal cannula oxygen Cardiovascular status: blood pressure returned to baseline and stable Postop Assessment: no apparent nausea or vomiting Anesthetic complications: no    Last Vitals:  Vitals:   10/28/18 0955 10/28/18 1009  BP: 90/60 93/60  Pulse: 77 64  Resp: 18 13  Temp:  (!) 36.2 C  SpO2: 95% 95%    Last Pain:  Vitals:   10/28/18 1009  TempSrc:   PainSc: 0-No pain                 Barnet Glasgow

## 2018-10-28 NOTE — Transfer of Care (Signed)
Immediate Anesthesia Transfer of Care Note  Patient: Sharon Daniels  Procedure(s) Performed: OPEN REDUCTION INTERNAL FIXATION (ORIF) LEFT ANKLE BIMALLEOLAR FRACTURE DISLOCATION (Left Ankle)  Patient Location: PACU  Anesthesia Type:GA combined with regional for post-op pain  Level of Consciousness: awake, alert  and oriented  Airway & Oxygen Therapy: Patient Spontanous Breathing and Patient connected to nasal cannula oxygen  Post-op Assessment: Report given to RN, Post -op Vital signs reviewed and stable and Patient moving all extremities  Post vital signs: Reviewed and stable  Last Vitals:  Vitals Value Taken Time  BP 96/62 10/28/18 0911  Temp    Pulse 63 10/28/18 0911  Resp 14 10/28/18 0911  SpO2 98 % 10/28/18 0911  Vitals shown include unvalidated device data.  Last Pain:  Vitals:   10/28/18 0908  TempSrc:   PainSc: (P) Asleep      Patients Stated Pain Goal: 3 ([id number])  Complications: No apparent anesthesia complications

## 2018-10-28 NOTE — Anesthesia Procedure Notes (Signed)
Anesthesia Regional Block: Adductor canal block   Pre-Anesthetic Checklist: ,, timeout performed, Correct Patient, Correct Site, Correct Laterality, Correct Procedure, Correct Position, site marked, Risks and benefits discussed,  Surgical consent,  Pre-op evaluation,  At surgeon's request and post-op pain management  Laterality: Lower and Left  Prep: chloraprep       Needles:  Injection technique: Single-shot  Needle Type: Echogenic Needle     Needle Length: 9cm  Needle Gauge: 22     Additional Needles:   Procedures:,,,, ultrasound used (permanent image in chart),,,,  Narrative:  Start time: 10/28/2018 6:53 AM End time: 10/28/2018 7:00 AM Injection made incrementally with aspirations every 5 mL.  Performed by: Personally  Anesthesiologist: Barnet Glasgow, MD  Additional Notes: Block assessed prior to surgery. Pt tolerated procedure well.

## 2018-10-28 NOTE — Op Note (Signed)
10/28/2018  9:00 AM  PATIENT:  Sharon Daniels  68 y.o. female  PRE-OPERATIVE DIAGNOSIS: Left ankle bimalleolar fracture dislocation (closed)       POST-OPERATIVE DIAGNOSIS:   1.  Closed left ankle dislocation      2.  Left tibial pilon fracture (closed and displaced)      3.  Avulsion of the left anterior talofibular ligament and calcaneofibular ligament from the distal fibula      4.  Dislocation of the left peroneal tendons with avulsion of the superior peroneal retinaculum from the fibula      5.  Left peroneus brevis traumatic tendon rupture  Procedure(s): 1.  Open treatment of left tibial pilon fracture with internal fixation   2.  Repair of left peroneus brevis tendon tear   3.  Repair of dislocating left peroneal tendons without fibular osteotomy   4.  Repair of the left anterior talofibular ligament and calcaneofibular ligament   5.  AP, lateral and mortise radiographs of the left ankle  SURGEON:  Wylene Simmer, MD  ASSISTANT: Mechele Claude, PA-C  ANESTHESIA:   General, regional  EBL:  minimal   TOURNIQUET:   Total Tourniquet Time Documented: Thigh (Left) - 70 minutes Total: Thigh (Left) - 70 minutes  COMPLICATIONS:  None apparent  DISPOSITION:  Extubated, awake and stable to recovery.  INDICATION FOR PROCEDURE: The patient is a 68 year old female with past medical history significant for pulmonary embolism.  She fell just over a week ago injuring her left ankle.  She was found to the emergency room to have a bimalleolar fracture dislocation.  She underwent provisional closed reduction.  She presents now for operative treatment of this displaced and unstable left ankle fracture.  The risks and benefits of the alternative treatment options have been discussed in detail.  The patient wishes to proceed with surgery and specifically understands risks of bleeding, infection, nerve damage, blood clots, need for additional surgery, amputation and death.  PROCEDURE IN DETAIL:   After pre operative consent was obtained, and the correct operative site was identified, the patient was brought to the operating room and placed supine on the OR table.  Anesthesia was administered.  Pre-operative antibiotics were administered.  A surgical timeout was taken.  The left lower extremity was prepped and draped in standard sterile fashion with a tourniquet around the thigh.  The extremity was elevated and the tourniquet was inflated to 250 mmHg.  A longitudinal incision was then made over the medial malleolus.  Dissection was carried down to the subcutaneous tissues.  The fracture site was identified.  The fracture was noted to be a displaced tibial pilon fracture due to its extension well into the articular weightbearing surface of the distal tibia.  The fracture site was opened and cleaned of all hematoma.  Impacted fragments of articular cartilage were removed from the tibial plafond.  The wound was irrigated copiously.  The visualized portion of the talus appeared healthy and intact.  The fracture line extended obliquely from the anterior tibial plafond and to the posterior tibial tendon sheath.  The fracture was reduced and provisionally pinned.  AP, mortise and lateral radiographs confirmed adequate reduction of the fracture.  A 4 hole one third tubular plate was selected from the [employer] small frag set.  It was contoured to fit the anteromedial cortex of the distal tibia.  It was positioned as a buttress plate and secured proximally with 2 bicortical screws and distally with a single unicortical screw.  The  guidepin positioned at the physeal scar was then overdrilled.  A 4 mm partially-threaded cannulated screw with a washer was placed over the guidepin and tightly secured compressing the fracture site appropriately.  Radiographs again confirmed appropriate alignment of the fracture and appropriate position and length of all hardware.  The posterior tibial tendon was inspected and was noted to be  healthy without any evidence of tendon tear.  Attention was turned to the lateral malleolus.  A longitudinal incision was made and dissection was carried down through the subcutaneous tissues.  The distal fibula was inspected.  The peroneal tendons were noted to be dislocated with avulsion of the superior peroneal retinaculum from its insertion on the retrofibular groove.  The peroneus brevis was noted to have a traumatic acute longitudinal split tear.  This was debrided and repaired with mattress sutures of 0 Vicryl.  The peroneals were reduced.  The anterior talofibular ligament and calcaneofibular ligament were both noted to be avulsed from their origins on the distal fibula.  Both were debrided of all bone fragments.  1.45 mm [employer] juggernaut anchors were inserted into the distal fibula at the origin of the ATFL and CFL.  Drill holes were then made at the retrofibular groove for repair of the superior peroneal retinaculum.  The SPR was repaired with imbricating sutures of 0 Vicryl through the drill holes.  The sutures were tagged for tying later.  The suture from the 2 anchors were then passed through the ATFL and CFL.  The ankle was held in a properly reduced position and sutures tied at both ligaments advancing them on to the fibula at their origins.  Suture was then passed down through the extensor retinaculum which was advanced onto the fibula.  Sutures were then passed through the periosteum and tied.  The sutures posteriorly at the [facility] were then tied.  The ankle was noted to then be stable to anterior drawer testing.  The wound was irrigated copiously.  Final AP, mortise and lateral radiographs confirmed appropriate reduction of the tibial pilon fracture and appropriate reduction of the tibial talar joint.  Stress radiographs showed no evidence of instability at the syndesmosis.  The wound was again irrigated.  Vancomycin powder was sprinkled in the wounds.  Subcutaneous tissues were approximated  with 2-0 Vicryl.  Skin incisions were closed with nylon.  Sterile dressings were applied followed by well-padded short leg splint.  Tourniquet was released after application of the dressings.  The patient was awakened from anesthesia and transported to the recovery room in stable condition.   FOLLOW UP PLAN: Lovenox for DVT prophylaxis due to the patient's history of pulmonary embolism.  Follow-up in the office in 2 weeks for suture removal and conversion to a short leg cast.  Plan 6 weeks nonweightbearing immobilization.  RADIOGRAPHS: AP, mortise and lateral radiographs of the left ankle are obtained intraoperatively.  These show interval reduction of the ankle joint and tibial pilon fracture.  Hardware is appropriately positioned and of the appropriate lengths.    Mechele Claude PA-C was present and scrubbed for the duration of the operative case. His assistance was essential in positioning the patient, prepping and draping, gaining and maintaining exposure, performing the operation, closing and dressing the wounds and applying the splint.

## 2018-10-28 NOTE — H&P (Signed)
Sharon Daniels is an 68 y.o. female.   Chief Complaint: Left ankle pain HPI: The patient is a 68 year old female with past medical history significant for pulmonary embolism.  She fell and fractured her left ankle just over a week ago.  She underwent closed reduction in the emergency room and has been immobilized in a splint.  She presents now for operative treatment of this displaced and unstable left ankle trimalleolar fracture.  Past Medical History:  Diagnosis Date  . Acute pulmonary embolism ([place]) 06/13/2013   CT angio chest 06/12/13  . ADHD   . Allergy   . Anemia   . Arthritis   . Complication of anesthesia    "during breast surgery: could hear the surgery, wake up too soon, anxious  . COPD (chronic obstructive pulmonary disease) ([place])   . Depression    hx of  . DVT (deep venous thrombosis) ([place]) 2015  . GERD (gastroesophageal reflux disease)   . H/O bronchitis   . Hard of hearing   . History of kidney stones    x3  . Hypertension   . Osteoporosis   . Pneumonia    hx of 10 years ago  . PONV (postoperative nausea and vomiting)    "mild nausea"  . Stress incontinence   . Substance abuse ([place])   . Wears glasses     Past Surgical History:  Procedure Laterality Date  . ABDOMINOPLASTY  09/2015  . BREAST REDUCTION SURGERY  08/04/2015  . BREAST REDUCTION SURGERY Bilateral 08/04/2015   Procedure: MAMMARY REDUCTION  (BREAST);  Surgeon: Crissie Reese, MD;  Location: [facility];  Service: Plastics;  Laterality: Bilateral;  . BREAST SURGERY  1985   abscess  . CARPAL TUNNEL RELEASE Left   . [place]  . COLONOSCOPY    . COSMETIC SURGERY  2013   in the office  . COSMETIC SURGERY     ear lobes shorten  . HAMMERTOE RECONSTRUCTION WITH WEIL OSTEOTOMY Right 06/26/2018   Procedure: Right 2nd toe hammertoe correction; 2nd metatarsal Weil osteotomy;  Surgeon: Wylene Simmer, MD;  Location: [facility];  Service: Orthopedics;  Laterality: Right;  76min  .  NASAL SINUS SURGERY  2000  . TOTAL KNEE ARTHROPLASTY Bilateral 06/05/2013   Procedure: BILATERAL TOTAL KNEE ARTHROPLASTY;  Surgeon: Gearlean Alf, MD;  Location: [facility];  Service: Orthopedics;  Laterality: Bilateral;    Family History  Problem Relation Age of Onset  . Kidney disease Mother   . Atrial fibrillation Mother   . Breast cancer Mother   . Ovarian cancer Mother   . Heart attack Father   . Colon cancer Neg Hx    Social History:  reports that she quit smoking about a year ago. Her smoking use included cigarettes. She has a 30.00 pack-year smoking history. She has never used smokeless tobacco. She reports current alcohol use of about 4.0 standard drinks of alcohol per week. She reports that she does not use drugs.  Allergies:  Allergies  Allergen Reactions  . Morphine Itching, Anxiety and Rash    IV only, made her mean    Medications Prior to Admission  Medication Sig Dispense Refill  . atomoxetine (STRATTERA) 40 MG capsule Take 40 mg by mouth daily.    . budesonide-formoterol (SYMBICORT) 160-4.5 MCG/ACT inhaler Inhale 2 puffs into the lungs 2 (two) times daily as needed (For shortness of breath.).    Marland Kitchen denosumab (PROLIA) 60 MG/ML SOLN injection Inject 60 mg into the skin  once. Administer in upper arm, thigh, or abdomen (Patient taking differently: Inject 60 mg into the skin every 6 (six) months. Administer in upper arm, thigh, or abdomen) 1 Syringe 0  . Estradiol 10 MCG TABS vaginal tablet Place 1 tablet (10 mcg total) vaginally 2 (two) times a week. 8 tablet 12  . fluticasone (FLONASE) 50 MCG/ACT nasal spray Place 1 spray into both nostrils daily as needed (for seasonal allergies). Reported on 04/21/2015    . losartan (COZAAR) 25 MG tablet Take 25 mg by mouth daily.   2  . montelukast (SINGULAIR) 10 MG tablet Take 10 mg by mouth daily.    . Multiple Vitamin (MULTIVITAMIN WITH MINERALS) TABS tablet Take 1 tablet by mouth daily.    Marland Kitchen omeprazole (PRILOSEC) 40 MG capsule Take 40  mg by mouth daily as needed (for acid reflux or heartburn). Reported on 04/21/2015    . oxycodone (OXY-IR) 5 MG capsule Take 1 capsule (5 mg total) by mouth every 6 (six) hours as needed. 12 capsule 0  . Propylene Glycol (SYSTANE BALANCE) 0.6 % SOLN Place 1 drop into both eyes as needed (Dry eyes). Reported on 04/21/2015    . psyllium (METAMUCIL) 58.6 % packet Take 1 packet by mouth daily as needed (For constipation.).    Marland Kitchen rosuvastatin (CRESTOR) 5 MG tablet Take 5 mg by mouth daily.  3  . sertraline (ZOLOFT) 50 MG tablet Take 50 mg by mouth daily.    Marland Kitchen thiamine (VITAMIN B-1) 100 MG tablet Take 100 mg by mouth daily.    . valsartan (DIOVAN) 80 MG tablet Take 80 mg by mouth every morning.     . Cholecalciferol (VITAMIN D3) 1000 UNITS CAPS Take 1,000 Units by mouth daily.       No results found for this or any previous visit (from the past 48 hour(s)). No results found.  ROS no recent fever, chills, nausea, vomiting or changes in her appetite  Blood pressure 138/64, pulse 62, temperature 98.5 F (36.9 C), temperature source Oral, resp. rate 18, height 5\' 7"  (1.702 m), weight 88.9 kg, SpO2 98 %. Physical Exam  Well-nourished well-developed woman in no apparent distress.  Alert and oriented x4.  Mood and affect are normal.  Extraocular motions are intact.  Respirations are unlabored.  Gait is nonweightbearing on the left.  The left ankle is splinted.  The toes have brisk capillary refill.  No lymphadenopathy.  Sensibility to light touch is intact dorsally and plantarly at the forefoot.   Assessment/Plan Left ankle trimalleolar fracture dislocation -to the operating room today for open treatment with internal fixation.  The risks and benefits of the alternative treatment options have been discussed in detail.  The patient wishes to proceed with surgery and specifically understands risks of bleeding, infection, nerve damage, blood clots, need for additional surgery, amputation and death.   Wylene Simmer, MD 2018/11/01, 7:20 AM

## 2018-10-29 ENCOUNTER — Encounter (HOSPITAL_COMMUNITY): Payer: Self-pay | Admitting: Orthopedic Surgery

## 2018-12-17 MED FILL — ATOMOXETINE HCL 40 MG CAPS: 40 | 30 days supply | Qty: 30 | Fill #3

## 2018-12-17 MED FILL — LOSARTAN POTASSIUM 25 MG TA: 25 | 90 days supply | Qty: 90 | Fill #1

## 2019-01-22 MED FILL — SERTRALINE HCL 50 MG TABLET: 50 | 90 days supply | Qty: 90 | Fill #1

## 2019-01-22 MED FILL — ATOMOXETINE HCL 40 MG CAPS: 40 | 30 days supply | Qty: 30 | Fill #0

## 2019-02-03 MED FILL — CEPHALEXIN 500 MG CAPSULE: 500 | 7 days supply | Qty: 28 | Fill #0

## 2019-02-03 MED FILL — TOBRAMYCIN 0.3 % SOLN: 0.3 | 13 days supply | Qty: 5 | Fill #0

## 2019-02-03 MED FILL — PROMETHAZINE 25 MG TABLET: 25 | 4 days supply | Qty: 20 | Fill #0

## 2019-02-03 MED FILL — HYDROCODON-APAP 10-325: 10-325 | 5 days supply | Qty: 25 | Fill #0

## 2019-02-03 MED FILL — TRIAZOLAM 0.25 MG TABLET: 0.25 | 1 days supply | Qty: 3 | Fill #0

## 2019-02-13 MED FILL — MONTELUKAST SOD 10 MG TAB: 10 | 90 days supply | Qty: 90 | Fill #2

## 2019-02-13 MED FILL — ROSUVASTATIN CALCIUM 5 MG T: 5 | 90 days supply | Qty: 90 | Fill #1

## 2019-02-15 ENCOUNTER — Ambulatory Visit: Payer: Medicare Other

## 2019-02-25 MED FILL — ATOMOXETINE HCL 40 MG CAPS: 40 | 30 days supply | Qty: 30 | Fill #1

## 2019-02-25 MED FILL — ESTRADIOL 10 MCG TABS: 10 | 28 days supply | Qty: 8 | Fill #1

## 2019-03-03 ENCOUNTER — Ambulatory Visit: Payer: Medicare Other

## 2019-03-06 ENCOUNTER — Telehealth: Payer: Self-pay | Admitting: *Deleted

## 2019-03-06 DIAGNOSIS — M810 Age-related osteoporosis without current pathological fracture: Secondary | ICD-10-CM

## 2019-03-06 NOTE — Telephone Encounter (Addendum)
Deductible n/a  OOP MAX $4200($180met)  Annual exam 05/04/2018 TF  Calcium 10.4            Date 03/24/2019  Upcoming dental procedures   Prior Authorization needed NO  Pt estimated Cost $213   [place] 05/04/2019    Coverage Details: 20% ONE DOSE, 20 % ADMIN FEE

## 2019-03-24 ENCOUNTER — Ambulatory Visit: Payer: Medicare Other

## 2019-03-24 ENCOUNTER — Other Ambulatory Visit: Payer: Medicare Other

## 2019-03-24 ENCOUNTER — Other Ambulatory Visit: Payer: Self-pay

## 2019-03-24 DIAGNOSIS — M810 Age-related osteoporosis without current pathological fracture: Secondary | ICD-10-CM

## 2019-03-25 LAB — CALCIUM: Calcium: 10.4 mg/dL (ref 8.6–10.4)

## 2019-03-27 MED FILL — ATOMOXETINE HCL 40 MG CAPS: 40 | 30 days supply | Qty: 30 | Fill #2

## 2019-04-21 MED FILL — LOSARTAN POTASSIUM 25 MG TA: 25 | 90 days supply | Qty: 90 | Fill #2

## 2019-04-21 MED FILL — CEPHALEXIN 500 MG CAPSULE: 500 | 10 days supply | Qty: 40 | Fill #0

## 2019-04-28 MED FILL — LOSARTAN POTASSIUM 25 MG TA: 25 | 90 days supply | Qty: 90 | Fill #0

## 2019-04-28 MED FILL — CEFDINIR 300 MG CAPSULE: 300 | 10 days supply | Qty: 20 | Fill #0

## 2019-04-28 MED FILL — AZELASTINE HCL 137 MCG/SPRA: 137 | 25 days supply | Qty: 30 | Fill #0

## 2019-05-01 ENCOUNTER — Other Ambulatory Visit: Payer: Self-pay

## 2019-05-01 ENCOUNTER — Encounter: Payer: Medicare Other | Admitting: Obstetrics and Gynecology

## 2019-05-04 ENCOUNTER — Other Ambulatory Visit: Payer: Self-pay | Admitting: Obstetrics and Gynecology

## 2019-05-04 ENCOUNTER — Other Ambulatory Visit: Payer: Self-pay

## 2019-05-04 ENCOUNTER — Encounter: Payer: Self-pay | Admitting: Obstetrics and Gynecology

## 2019-05-04 ENCOUNTER — Ambulatory Visit: Payer: Medicare Other | Admitting: Obstetrics and Gynecology

## 2019-05-04 VITALS — BP 124/78 | Ht 67.0 in | Wt 188.0 lb

## 2019-05-04 DIAGNOSIS — N952 Postmenopausal atrophic vaginitis: Secondary | ICD-10-CM | POA: Diagnosis not present

## 2019-05-04 DIAGNOSIS — Z9189 Other specified personal risk factors, not elsewhere classified: Secondary | ICD-10-CM | POA: Diagnosis not present

## 2019-05-04 DIAGNOSIS — M810 Age-related osteoporosis without current pathological fracture: Secondary | ICD-10-CM

## 2019-05-04 DIAGNOSIS — Z01419 Encounter for gynecological examination (general) (routine) without abnormal findings: Secondary | ICD-10-CM | POA: Diagnosis not present

## 2019-05-04 MED ORDER — FLUCONAZOLE 150 MG PO TABS: 150.0000 mg | ORAL_TABLET | Freq: Once | ORAL | 0 refills | Status: AC

## 2019-05-04 MED ORDER — DENOSUMAB 60 MG/ML ~~LOC~~ SOSY
60.0000 mg | PREFILLED_SYRINGE | Freq: Once | SUBCUTANEOUS | Status: AC
  Administered 2019-05-04: 60 mg via SUBCUTANEOUS

## 2019-05-04 MED ORDER — ESTRADIOL 10 MCG VA TABS: 1.0000 | ORAL_TABLET | VAGINAL | 12 refills | Status: DC

## 2019-05-04 MED FILL — ESTRADIOL 10 MCG TABS: 10 | 28 days supply | Qty: 8 | Fill #0

## 2019-05-04 MED FILL — FLUCONAZOLE 150 MG TABLET: 150 | 3 days supply | Qty: 2 | Fill #0

## 2019-05-04 NOTE — Addendum Note (Signed)
Addended by: Nelva Nay on: 05/04/2019 12:52 PM   Modules accepted: Orders

## 2019-05-04 NOTE — Progress Notes (Signed)
Sharon Daniels 1950/11/28 [id number]  SUBJECTIVE:  69 y.o. [id number] female for annual routine gynecologic exam. She has experienced some vaginal dryness.  She is forgetting to use the Vagifem sometimes.  She would like a refill.  She is treating a sinus infection with antibiotics and would like to have antifungal prophylaxis since she tends to get vaginal yeast infections when taking antibiotics.  Current Outpatient Medications  Medication Sig Dispense Refill  . atomoxetine (STRATTERA) 40 MG capsule Take 40 mg by mouth daily.    . Cholecalciferol (VITAMIN D3) 1000 UNITS CAPS Take 1,000 Units by mouth daily.     Marland Kitchen docusate sodium (COLACE) 100 MG capsule Take 1 capsule (100 mg total) by mouth 2 (two) times daily. While taking narcotic pain medicine. 30 capsule 0  . Estradiol 10 MCG TABS vaginal tablet Place 1 tablet (10 mcg total) vaginally 2 (two) times a week. 8 tablet 12  . losartan (COZAAR) 25 MG tablet Take 25 mg by mouth daily.   2  . montelukast (SINGULAIR) 10 MG tablet Take 10 mg by mouth daily.    . Multiple Vitamin (MULTIVITAMIN WITH MINERALS) TABS tablet Take 1 tablet by mouth daily.    Marland Kitchen omeprazole (PRILOSEC) 40 MG capsule Take 40 mg by mouth daily as needed (for acid reflux or heartburn). Reported on 04/21/2015    . Propylene Glycol (SYSTANE BALANCE) 0.6 % SOLN Place 1 drop into both eyes as needed (Dry eyes). Reported on 04/21/2015    . psyllium (METAMUCIL) 58.6 % packet Take 1 packet by mouth daily as needed (For constipation.).    Marland Kitchen rosuvastatin (CRESTOR) 5 MG tablet Take 5 mg by mouth daily.  3  . senna (SENOKOT) 8.6 MG TABS tablet Take 2 tablets (17.2 mg total) by mouth 2 (two) times daily. 30 tablet 0  . sertraline (ZOLOFT) 50 MG tablet Take 50 mg by mouth daily.    Marland Kitchen thiamine (VITAMIN B-1) 100 MG tablet Take 100 mg by mouth daily.    . valsartan (DIOVAN) 80 MG tablet Take 80 mg by mouth every morning.     . denosumab (PROLIA) 60 MG/ML SOLN injection Inject 60 mg into the  skin once. Administer in upper arm, thigh, or abdomen (Patient taking differently: Inject 60 mg into the skin every 6 (six) months. Administer in upper arm, thigh, or abdomen) 1 Syringe 0  . fluticasone (FLONASE) 50 MCG/ACT nasal spray Place 1 spray into both nostrils daily as needed (for seasonal allergies). Reported on 04/21/2015     No current facility-administered medications for this visit.   Allergies: Morphine  No LMP recorded (lmp unknown). Patient is postmenopausal.  Past medical history,surgical history, problem list, medications, allergies, family history and social history were all reviewed and documented as reviewed in the EPIC chart.  ROS:  Feeling well. No dyspnea or chest pain on exertion.  No abdominal pain, change in bowel habits, black or bloody stools.  No urinary tract symptoms. GYN ROS: no abnormal bleeding, pelvic pain or discharge, no breast pain or new or enlarging lumps on self exam. No neurological complaints.   OBJECTIVE:  BP 124/78   Ht 5\' 7"  (1.702 m)   Wt 188 lb (85.3 kg)   LMP  (LMP Unknown)   BMI 29.44 kg/m  The patient appears well, alert, oriented x 3, in no distress. ENT normal.  Neck supple. No cervical or supraclavicular adenopathy or thyromegaly.  Lungs are clear, good air entry, no wheezes, rhonchi or rales. S1 and S2  normal, no murmurs, regular rate and rhythm.  Abdomen soft without tenderness, guarding, mass or organomegaly.  Neurological is normal, no focal findings.  BREAST EXAM: breasts appear normal, left breast just at the inferior junction of the areola (near the previous breast reduction scar line) there is a pea-sized firm mass that is mobile in the superficial tissues, no other suspicious masses, no skin or nipple changes or axillary nodes  PELVIC EXAM: VULVA: normal appearing vulva with no masses, tenderness or lesions, VAGINA: normal appearing vagina with normal color and discharge, no lesions, CERVIX: normal appearing cervix without  discharge or lesions, UTERUS: uterus is normal size, shape, consistency and nontender, ADNEXA: normal adnexa in size, nontender and no masses  Chaperone: [name] present during the examination  ASSESSMENT:  69 y.o. [id number] here for annual gynecologic exam  PLAN:   1. Postmenopausal.  We discussed vaginal dryness will tend to be an issue if she is not remembering to use the Vagifem.  Small risks of systemic absorption and the associated risks including breast cancer, endometrial cancer, heart attack, stroke, DVT, PE are all reviewed.  She accepts the risks and refill x 1 year is provided at her request. 2. Pap smear 04/2018.  No significant history of abnormal Pap smears.  Next Pap smear due 2023 following the current guidelines recommending the 3 year interval, we briefly discussed the potential option of discontinuing screening based on age criteria. 3.  Fluconazole 150 mg 1 tablet prescription is provided with in case she develops yeast infection symptoms due to current antibiotic use. 4. Mammogram 09/2018, small mass noted on left breast examination today inferior to the areola.  Could be a result from the scar tissue.  Otherwise normal breast exam today.  I recommended that she get a diagnostic left mammogram which I will have staff help her to coordinate. 5. Colonoscopy 2016.  Recommended that she follow up at the recommended interval.   6. Osteoporosis.  DEXA 03/2015.  She was overdue at her last visit last year and this was discussed.  She did not get a repeat DEXA scan.  She continues on Prolia.  Next DEXA recommended now so she plans to schedule this.  The study is ordered. 7. Health maintenance.  No labs today as she normally has these completed with her primary care provider.  Return annually or sooner, prn.  Joseph Pierini MD 05/04/19

## 2019-05-05 ENCOUNTER — Telehealth: Payer: Self-pay | Admitting: *Deleted

## 2019-05-05 NOTE — Telephone Encounter (Signed)
Patient scheduled at [facility] on 05/19/19 @ 10:30am. Patient informed, order faxed.

## 2019-05-05 NOTE — Telephone Encounter (Signed)
-----   Message from Joseph Pierini, MD sent at 05/04/2019 12:39 PM EDT ----- Regarding: diagnostic mammogram Hi, I noticed a small breast lump just below the left areola on this patient's breast exam today.  It is near a scar line from her previous breast reduction but she has not felt this lump before so I would like her to get a diagnostic left mammogram, thank you

## 2019-05-12 MED FILL — MONTELUKAST SOD 10 MG TAB: 10 | 90 days supply | Qty: 90 | Fill #3

## 2019-05-12 MED FILL — ATOMOXETINE HCL 40 MG CAPS: 40 | 30 days supply | Qty: 30 | Fill #3

## 2019-05-12 MED FILL — ROSUVASTATIN CALCIUM 5 MG T: 5 | 90 days supply | Qty: 90 | Fill #2

## 2019-05-12 MED FILL — SERTRALINE HCL 50 MG TABLET: 50 | 90 days supply | Qty: 90 | Fill #2

## 2019-05-19 ENCOUNTER — Encounter: Payer: Self-pay | Admitting: Gastroenterology

## 2019-05-22 ENCOUNTER — Encounter: Payer: Self-pay | Admitting: Gynecology

## 2019-06-02 ENCOUNTER — Encounter: Payer: Self-pay | Admitting: Gynecology

## 2019-06-25 ENCOUNTER — Ambulatory Visit (AMBULATORY_SURGERY_CENTER): Payer: Self-pay | Admitting: *Deleted

## 2019-06-25 ENCOUNTER — Other Ambulatory Visit: Payer: Self-pay

## 2019-06-25 VITALS — Ht 67.0 in | Wt 185.0 lb

## 2019-06-25 DIAGNOSIS — Z86010 Personal history of colonic polyps: Secondary | ICD-10-CM

## 2019-06-25 MED ORDER — SUTAB 1479-225-188 MG PO TABS: 1.0000 | ORAL_TABLET | Freq: Once | ORAL | 0 refills | Status: AC

## 2019-06-25 MED FILL — SUTAB 1479-225-188 MG TABS: 1479-225-18 | 1 days supply | Qty: 24 | Fill #0

## 2019-06-25 NOTE — Progress Notes (Signed)

## 2019-07-09 ENCOUNTER — Encounter: Payer: Medicare Other | Admitting: Gastroenterology

## 2019-07-21 ENCOUNTER — Other Ambulatory Visit (HOSPITAL_COMMUNITY): Payer: Self-pay | Admitting: Family Medicine

## 2019-07-21 MED FILL — ROSUVASTATIN CALCIUM 5 MG T: 5 | 90 days supply | Qty: 90 | Fill #0

## 2019-07-21 MED FILL — AZELASTINE HCL 137 MCG SPRY: 0.1 | 25 days supply | Qty: 30 | Fill #0

## 2019-07-21 MED FILL — VALSARTAN 160 MG TABLET: 160 | 90 days supply | Qty: 90 | Fill #0

## 2019-07-28 MED FILL — ATOMOXETINE HCL 40 MG CAPS: 40 | 30 days supply | Qty: 30 | Fill #5

## 2019-07-29 NOTE — Telephone Encounter (Signed)
PROLIA GIVEN 05/04/2019 NEXT INJECTION 11/04/2019

## 2019-08-07 MED FILL — SERTRALINE HCL 100 MG TAB: 100 | 90 days supply | Qty: 90 | Fill #0

## 2019-08-11 MED FILL — MOMETASONE FUROATE 0.1% CRM: 0.1 | 30 days supply | Qty: 45 | Fill #0

## 2019-08-12 ENCOUNTER — Other Ambulatory Visit: Payer: Self-pay

## 2019-08-12 ENCOUNTER — Ambulatory Visit (AMBULATORY_SURGERY_CENTER): Payer: Medicare Other | Admitting: Gastroenterology

## 2019-08-12 ENCOUNTER — Encounter: Payer: Self-pay | Admitting: Gastroenterology

## 2019-08-12 VITALS — BP 127/69 | HR 80 | Temp 96.9°F | Resp 24 | Ht 67.0 in | Wt 180.0 lb

## 2019-08-12 DIAGNOSIS — K621 Rectal polyp: Secondary | ICD-10-CM | POA: Diagnosis not present

## 2019-08-12 DIAGNOSIS — Z86010 Personal history of colonic polyps: Secondary | ICD-10-CM

## 2019-08-12 DIAGNOSIS — D128 Benign neoplasm of rectum: Secondary | ICD-10-CM

## 2019-08-12 DIAGNOSIS — D123 Benign neoplasm of transverse colon: Secondary | ICD-10-CM | POA: Diagnosis not present

## 2019-08-12 DIAGNOSIS — D122 Benign neoplasm of ascending colon: Secondary | ICD-10-CM | POA: Diagnosis not present

## 2019-08-12 MED ORDER — SODIUM CHLORIDE 0.9 % IV SOLN
500.0000 mL | Freq: Once | INTRAVENOUS | Status: DC
Start: 2019-08-12 — End: 2019-08-12

## 2019-08-12 NOTE — Progress Notes (Signed)
Called to room to assist during endoscopic procedure.  Patient ID and intended procedure confirmed with present staff. Received instructions for my participation in the procedure from the performing physician.  

## 2019-08-12 NOTE — Progress Notes (Signed)
Report to PACU, RN, vss, BBS= Clear.  

## 2019-08-12 NOTE — Patient Instructions (Signed)
Handout on polyps given to you today  Await pathology results   YOU HAD AN ENDOSCOPIC PROCEDURE TODAY AT THE [facility]:   Refer to the procedure report that was given to you for any specific questions about what was found during the examination.  If the procedure report does not answer your questions, please call your gastroenterologist to clarify.  If you requested that your care partner not be given the details of your procedure findings, then the procedure report has been included in a sealed envelope for you to review at your convenience later.  YOU SHOULD EXPECT: Some feelings of bloating in the abdomen. Passage of more gas than usual.  Walking can help get rid of the air that was put into your GI tract during the procedure and reduce the bloating. If you had a lower endoscopy (such as a colonoscopy or flexible sigmoidoscopy) you may notice spotting of blood in your stool or on the toilet paper. If you underwent a bowel prep for your procedure, you may not have a normal bowel movement for a few days.  Please Note:  You might notice some irritation and congestion in your nose or some drainage.  This is from the oxygen used during your procedure.  There is no need for concern and it should clear up in a day or so.  SYMPTOMS TO REPORT IMMEDIATELY:   Following lower endoscopy (colonoscopy or flexible sigmoidoscopy):  Excessive amounts of blood in the stool  Significant tenderness or worsening of abdominal pains  Swelling of the abdomen that is new, acute  Fever of 100F or higher  For urgent or emergent issues, a gastroenterologist can be reached at any hour by calling [phone]. Do not use MyChart messaging for urgent concerns.    DIET:  We do recommend a small meal at first, but then you may proceed to your regular diet.  Drink plenty of fluids but you should avoid alcoholic beverages for 24 hours.  ACTIVITY:  You should plan to take it easy for the rest of today and  you should NOT DRIVE or use heavy machinery until tomorrow (because of the sedation medicines used during the test).    FOLLOW UP: Our staff will call the number listed on your records 48-72 hours following your procedure to check on you and address any questions or concerns that you may have regarding the information given to you following your procedure. If we do not reach you, we will leave a message.  We will attempt to reach you two times.  During this call, we will ask if you have developed any symptoms of COVID 19. If you develop any symptoms (ie: fever, flu-like symptoms, shortness of breath, cough etc.) before then, please call [phone].  If you test positive for Covid 19 in the 2 weeks post procedure, please call and report this information to us.    If any biopsies were taken you will be contacted by phone or by letter within the next 1-3 weeks.  Please call us at [phone] if you have not heard about the biopsies in 3 weeks.    SIGNATURES/CONFIDENTIALITY: You and/or your care partner have signed paperwork which will be entered into your electronic medical record.  These signatures attest to the fact that that the information above on your After Visit Summary has been reviewed and is understood.  Full responsibility of the confidentiality of this discharge information lies with you and/or your care-partner. 

## 2019-08-12 NOTE — Op Note (Signed)
[facility] Patient Name: Sharon Daniels Procedure Date: 08/12/2019 12:56 PM MRN: [id number] Endoscopist: Mauri Pole , MD Age: 69 Referring MD:  Date of Birth: [birth year] Gender: Female Account #: [id number] Procedure:                Colonoscopy Indications:              High risk colon cancer surveillance: Personal                            history of colonic polyps, High risk colon cancer                            surveillance: Personal history of sessile serrated                            colon polyp (10 mm or greater in size) Medicines:                Monitored Anesthesia Care Procedure:                Pre-Anesthesia Assessment:                           - Prior to the procedure, a History and Physical                            was performed, and patient medications and                            allergies were reviewed. The patient's tolerance of                            previous anesthesia was also reviewed. The risks                            and benefits of the procedure and the sedation                            options and risks were discussed with the patient.                            All questions were answered, and informed consent                            was obtained. Prior Anticoagulants: The patient has                            taken no previous anticoagulant or antiplatelet                            agents. ASA Grade Assessment: II - A patient with                            mild systemic disease. After reviewing the risks  and benefits, the patient was deemed in                            satisfactory condition to undergo the procedure.                           After obtaining informed consent, the colonoscope                            was passed under direct vision. Throughout the                            procedure, the patient's blood pressure, pulse, and                            oxygen  saturations were monitored continuously. The                            Colonoscope was introduced through the anus and                            advanced to the the cecum, identified by                            appendiceal orifice and ileocecal valve. The                            colonoscopy was technically difficult and complex                            due to poor endoscopic visualization. The patient                            tolerated the procedure well. The quality of the                            bowel preparation was inadequate. The ileocecal                            valve, appendiceal orifice, and rectum were                            photographed. Scope In: 1:14:30 PM Scope Out: 1:37:57 PM Scope Withdrawal Time: 0 hours 15 minutes 0 seconds  Total Procedure Duration: 0 hours 23 minutes 27 seconds  Findings:                 The perianal and digital rectal examinations were                            normal.                           Three sessile polyps were found in the transverse  colon and ascending colon. The polyps were 1 to 2                            mm in size. These polyps were removed with a cold                            biopsy forceps. Resection and retrieval were                            complete.                           A 5 mm polyp was found in the rectum. The polyp was                            sessile. The polyp was removed with a cold snare.                            Resection and retrieval were complete.                           Non-bleeding internal hemorrhoids were found during                            retroflexion. The hemorrhoids were small. Complications:            No immediate complications. Estimated Blood Loss:     Estimated blood loss was minimal. Impression:               - Preparation of the colon was inadequate.                           - Three 1 to 2 mm polyps in the transverse colon                             and in the ascending colon, removed with a cold                            biopsy forceps. Resected and retrieved.                           - One 5 mm polyp in the rectum, removed with a cold                            snare. Resected and retrieved.                           - Non-bleeding internal hemorrhoids. Recommendation:           - Patient has a contact number available for                            emergencies. The signs and symptoms of potential  delayed complications were discussed with the                            patient. Return to normal activities tomorrow.                            Written discharge instructions were provided to the                            patient.                           - Resume previous diet.                           - Continue present medications.                           - Await pathology results.                           - Repeat colonoscopy with in 1 year because the                            bowel preparation was suboptimal. Mauri Pole, MD 08/12/2019 1:47:51 PM This report has been signed electronically.

## 2019-08-12 NOTE — Progress Notes (Signed)
Pt's states no medical or surgical changes since previsit or office visit.  VS by [place]

## 2019-08-14 ENCOUNTER — Telehealth: Payer: Self-pay | Admitting: *Deleted

## 2019-08-14 NOTE — Telephone Encounter (Signed)
°  Follow up Call-  Call back number 08/12/2019  Post procedure Call Back phone  # [phone]  Permission to leave phone message Yes  Some recent data might be hidden     Patient questions:  Do you have a fever, pain , or abdominal swelling? No. Pain Score  0 *  Have you tolerated food without any problems? Yes.    Have you been able to return to your normal activities? Yes.    Do you have any questions about your discharge instructions: Diet   No. Medications  No. Follow up visit  No.  Do you have questions or concerns about your Care? No.  Actions: * If pain score is 4 or above: No action needed, pain <4.  1. Have you developed a fever since your procedure? no  2.   Have you had an respiratory symptoms (SOB or cough) since your procedure? no  3.   Have you tested positive for COVID 19 since your procedure no  4.   Have you had any family members/close contacts diagnosed with the COVID 19 since your procedure?  no   If yes to any of these questions please route to Joylene John, RN and Erenest Rasher, RN

## 2019-08-14 NOTE — Telephone Encounter (Signed)
No answer for post procedure callback. Left message and will call back later today. 

## 2019-08-28 ENCOUNTER — Encounter: Payer: Self-pay | Admitting: Gastroenterology

## 2019-09-01 MED FILL — ESTRADIOL 10 MCG TABS: 10 | 28 days supply | Qty: 8 | Fill #1

## 2019-09-02 ENCOUNTER — Other Ambulatory Visit (HOSPITAL_COMMUNITY): Payer: Self-pay | Admitting: Family Medicine

## 2019-09-02 MED FILL — ATOMOXETINE HCL 40 MG CAPS: 40 | 30 days supply | Qty: 30 | Fill #0

## 2019-10-02 MED FILL — ATOMOXETINE HCL 40 MG CAPS: 40 | 30 days supply | Qty: 30 | Fill #1

## 2019-10-19 ENCOUNTER — Telehealth: Payer: Self-pay | Admitting: *Deleted

## 2019-10-19 NOTE — Telephone Encounter (Signed)
Deductible n/a  OOP MAX [id number])  Annual exam 05/04/2019 JK  Calcium  10.4           Date 03/24/2019  Upcoming dental procedures   Prior Authorization needed NO  Pt estimated Cost $222  Appt 10/30/2019    Coverage Details: 20% one dose, 20% admin fee

## 2019-10-23 MED FILL — ESTRADIOL 10 MCG TABS: 10 | 28 days supply | Qty: 8 | Fill #2

## 2019-10-28 ENCOUNTER — Ambulatory Visit: Payer: Medicare Other | Admitting: Obstetrics and Gynecology

## 2019-10-28 ENCOUNTER — Encounter: Payer: Self-pay | Admitting: Obstetrics and Gynecology

## 2019-10-28 ENCOUNTER — Other Ambulatory Visit: Payer: Self-pay

## 2019-10-28 VITALS — BP 130/80

## 2019-10-28 DIAGNOSIS — R232 Flushing: Secondary | ICD-10-CM | POA: Diagnosis not present

## 2019-10-28 NOTE — Progress Notes (Signed)
Sharon Daniels 1950/07/21 [id number]  SUBJECTIVE:  69 y.o. [id number] female presents for hot flashes and night sweats.  She had these symptoms many years ago when she initially went through menopause.  She feels she has been having return of the vasomotor symptoms since about 1 year ago.  She reports she did have the symptoms when she was here for her annual exam earlier this year but did not bring them to attention.  She keeps house extra cool and also has a cooling pad in her bed with minimal to fit.  Notes more sweating during daytime.  She did blood work with Dr. Ernie Hew as she also lost 20 lbs unintentionally but reports all was normal.  She does have a history of anxiety.  Saw her PCP back in July and CMP and CBC and vitamin D levels were normal.  Had been on Zoloft 50 mg for a long time and PCP increased dose to 100 mg but she has not noticed any dose related changes in the hot flashes.  She did stop using Vagifem tablets but then restarted them as of July but has not noticed any changes in hot flashes.   No vaginal bleeding.  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   Current Outpatient Medications  Medication Sig Dispense Refill  . atomoxetine (STRATTERA) 40 MG capsule Take 40 mg by mouth daily.    . Cholecalciferol (VITAMIN D3) 1000 UNITS CAPS Take 1,000 Units by mouth daily.     Marland Kitchen docusate sodium (COLACE) 100 MG capsule Take 1 capsule (100 mg total) by mouth 2 (two) times daily. While taking narcotic pain medicine. 30 capsule 0  . Estradiol 10 MCG TABS vaginal tablet Place 1 tablet (10 mcg total) vaginally 2 (two) times a week. 8 tablet 12  . montelukast (SINGULAIR) 10 MG tablet Take 10 mg by mouth daily.    . Multiple Vitamin (MULTIVITAMIN WITH MINERALS) TABS tablet Take 1 tablet by mouth daily.    Marland Kitchen omeprazole (PRILOSEC) 40 MG capsule Take 40 mg by mouth daily as needed (for acid reflux or heartburn). Reported on 04/21/2015    . Propylene Glycol (SYSTANE BALANCE)  0.6 % SOLN Place 1 drop into both eyes as needed (Dry eyes). Reported on 04/21/2015    . psyllium (METAMUCIL) 58.6 % packet Take 1 packet by mouth daily as needed (For constipation.).    Marland Kitchen rosuvastatin (CRESTOR) 5 MG tablet Take 5 mg by mouth daily.  3  . senna (SENOKOT) 8.6 MG TABS tablet Take 2 tablets (17.2 mg total) by mouth 2 (two) times daily. 30 tablet 0  . sertraline (ZOLOFT) 50 MG tablet Take 100 mg by mouth daily.     Marland Kitchen thiamine (VITAMIN B-1) 100 MG tablet Take 100 mg by mouth daily.    . valsartan (DIOVAN) 80 MG tablet Take 80 mg by mouth every morning.     . denosumab (PROLIA) 60 MG/ML SOLN injection Inject 60 mg into the skin once. Administer in upper arm, thigh, or abdomen (Patient taking differently: Inject 60 mg into the skin every 6 (six) months. Administer in upper arm, thigh, or abdomen) 1 Syringe 0  . fluticasone (FLONASE) 50 MCG/ACT nasal spray Place 1 spray into both nostrils daily as needed (for seasonal allergies). Reported on 04/21/2015 (Patient not taking: Reported on 06/25/2019)     No current facility-administered medications for this visit.   Allergies: Morphine  No LMP recorded (lmp unknown). Patient is postmenopausal.  Past  medical history,surgical history, problem list, medications, allergies, family history and social history were all reviewed and documented as reviewed in the EPIC chart.   OBJECTIVE:  BP 130/80   LMP  (LMP Unknown)  The patient appears well, alert, oriented, in no distress. PELVIC EXAM: Deferred due to constant nature of the visit   ASSESSMENT:  69 y.o. [id number] here with vasomotor symptoms, likely related to menopausal causes  PLAN:  We discussed that there are possible anxiety and/or stress related causes for hormonal fluctuation.  She is in menopause but has had return of vasomotor symptoms.  We discussed that SSRI use can also cause vasomotor symptoms, but she has been on this therapy for a long time and has not noted any changes in her  symptoms with the increased dose of Zoloft.,  Still could consider alternative therapies with her PCP, perhaps trying another SSRI or related medication for anxiety control to see if that has any positive change for her in regard to the vasomotor symptoms. We will check TSH, FSH, estradiol level today to rule out other causes especially in light of the recent weight loss. I also recommended trialing black cohosh and/or soy products in dietary sources twice daily or supplements.  She will keep track of her symptoms and let us know if there is not any improvement.  I would be very hesitant to start her on systemic HRT given her history of DVT.   Joseph Pierini MD 10/28/19

## 2019-10-29 LAB — FOLLICLE STIMULATING HORMONE: FSH: 53.4 m[IU]/mL

## 2019-10-29 LAB — TSH: TSH: 1.36 mIU/L (ref 0.40–4.50)

## 2019-10-29 LAB — ESTRADIOL: Estradiol: <15 pg/mL

## 2019-10-30 ENCOUNTER — Ambulatory Visit: Payer: Medicare Other

## 2019-11-02 MED FILL — ATOMOXETINE HCL 40 MG CAPS: 40 | 30 days supply | Qty: 30 | Fill #2

## 2019-11-02 MED FILL — ROSUVASTATIN CALCIUM 5 MG T: 5 | 90 days supply | Qty: 90 | Fill #1

## 2019-11-10 MED FILL — SERTRALINE HCL 100 MG TAB: 100 | 90 days supply | Qty: 90 | Fill #1

## 2019-11-10 MED FILL — VALSARTAN 160 MG TABLET: 160 | 90 days supply | Qty: 90 | Fill #0

## 2019-11-23 ENCOUNTER — Other Ambulatory Visit (HOSPITAL_COMMUNITY): Payer: Self-pay | Admitting: Family Medicine

## 2019-11-25 MED FILL — ATOMOXETINE HCL 40 MG CAPS: 40 | 30 days supply | Qty: 30 | Fill #0

## 2019-12-01 ENCOUNTER — Other Ambulatory Visit (HOSPITAL_COMMUNITY): Payer: Self-pay | Admitting: Dentistry

## 2019-12-01 MED FILL — AMOXICILLIN 500 MG CAPSULE: 500 | 6 days supply | Qty: 25 | Fill #0

## 2019-12-21 MED FILL — ESTRADIOL 10 MCG TABS: 10 | 28 days supply | Qty: 8 | Fill #3

## 2020-01-11 ENCOUNTER — Other Ambulatory Visit (HOSPITAL_COMMUNITY): Payer: Self-pay | Admitting: Family Medicine

## 2020-01-11 MED FILL — MONTELUKAST SOD 10 MG TAB: 10 | 90 days supply | Qty: 90 | Fill #0

## 2020-01-11 MED FILL — AZELASTINE HCL 137 MCG SPRY: 0.1 | 25 days supply | Qty: 30 | Fill #1

## 2020-01-11 MED FILL — ATOMOXETINE HCL 40 MG CAPS: 40 | 30 days supply | Qty: 30 | Fill #1

## 2020-01-19 ENCOUNTER — Other Ambulatory Visit (HOSPITAL_COMMUNITY): Payer: Self-pay

## 2020-01-27 MED FILL — HYDROQUINONE 4% CREAM: 4 | 30 days supply | Qty: 28 | Fill #0

## 2020-01-27 MED FILL — TRETINOIN 0.1 % CREA: 0.1 | 30 days supply | Qty: 45 | Fill #0

## 2020-03-01 ENCOUNTER — Other Ambulatory Visit (HOSPITAL_COMMUNITY): Payer: Self-pay | Admitting: Family Medicine

## 2020-03-01 MED FILL — ESTRADIOL 10 MCG TABS: 10 | 28 days supply | Qty: 8 | Fill #4

## 2020-03-01 MED FILL — ATOMOXETINE HCL 40 MG CAPS: 40 | 30 days supply | Qty: 30 | Fill #2

## 2020-03-01 MED FILL — VALSARTAN 160 MG TABLET: 160 | 90 days supply | Qty: 90 | Fill #0

## 2020-03-01 MED FILL — ROSUVASTATIN CALCIUM 5 MG T: 5 | 90 days supply | Qty: 90 | Fill #2

## 2020-03-01 MED FILL — SERTRALINE HCL 100 MG TAB: 100 | 90 days supply | Qty: 90 | Fill #0

## 2020-03-02 ENCOUNTER — Other Ambulatory Visit (HOSPITAL_COMMUNITY): Payer: Self-pay | Admitting: Physician Assistant

## 2020-03-02 MED FILL — HYDROCODON-APAP 10-325: 10-325 | 5 days supply | Qty: 20 | Fill #0

## 2020-03-09 ENCOUNTER — Telehealth: Payer: Self-pay | Admitting: *Deleted

## 2020-03-09 DIAGNOSIS — M810 Age-related osteoporosis without current pathological fracture: Secondary | ICD-10-CM

## 2020-03-09 NOTE — Telephone Encounter (Addendum)
Deductible n/a  OOP MAX $4200 ($145.98)   Annual exam 05/04/2019 jk  Calcium 9.8       Date  03/24/2020  Upcoming dental procedures   Prior Authorization needed NO  Pt estimated Cost $215  APPT 04/14/2020  Left message to return my call   Coverage Details: 20% OF ONE DOSE NO ADMIN FEE

## 2020-03-24 ENCOUNTER — Other Ambulatory Visit: Payer: Self-pay

## 2020-03-24 ENCOUNTER — Other Ambulatory Visit: Payer: Medicare Other

## 2020-03-24 DIAGNOSIS — M810 Age-related osteoporosis without current pathological fracture: Secondary | ICD-10-CM

## 2020-03-24 LAB — CALCIUM: Calcium: 9.8 mg/dL (ref 8.6–10.4)

## 2020-04-01 MED FILL — ESTRADIOL 10 MCG TABS: 10 | 28 days supply | Qty: 8 | Fill #5

## 2020-04-04 ENCOUNTER — Other Ambulatory Visit (HOSPITAL_COMMUNITY): Payer: Self-pay | Admitting: Family Medicine

## 2020-04-04 ENCOUNTER — Other Ambulatory Visit: Payer: Self-pay | Admitting: Family Medicine

## 2020-04-04 DIAGNOSIS — F17200 Nicotine dependence, unspecified, uncomplicated: Secondary | ICD-10-CM

## 2020-04-04 MED FILL — ATOMOXETINE HCL 40 MG CAP: 40 | 90 days supply | Qty: 90 | Fill #0

## 2020-04-06 NOTE — Telephone Encounter (Signed)
Pt states she will call me back to schedule Prolia. Will close encounter until then. Letter will be sent out as well.

## 2020-04-14 ENCOUNTER — Other Ambulatory Visit: Payer: Self-pay

## 2020-04-14 ENCOUNTER — Telehealth: Payer: Self-pay | Admitting: *Deleted

## 2020-04-14 ENCOUNTER — Ambulatory Visit (INDEPENDENT_AMBULATORY_CARE_PROVIDER_SITE_OTHER): Payer: Medicare Other | Admitting: *Deleted

## 2020-04-14 DIAGNOSIS — M810 Age-related osteoporosis without current pathological fracture: Secondary | ICD-10-CM

## 2020-04-14 MED ORDER — DENOSUMAB 60 MG/ML ~~LOC~~ SOSY
60.0000 mg | PREFILLED_SYRINGE | Freq: Once | SUBCUTANEOUS | Status: AC
  Administered 2020-04-14: 60 mg via SUBCUTANEOUS

## 2020-04-14 NOTE — Telephone Encounter (Signed)
-----   Message from Tamela Gammon, NP sent at 04/14/2020 10:47 AM EDT ----- This patient is overdue for bone density and it is recommended she have this done to make sure bone density is stable or improving. Her last DEXA was in 2017. Please have her schedule this very soon.

## 2020-04-14 NOTE — Telephone Encounter (Signed)
Placed on next appt note as a reminder to discuss at next office visit

## 2020-04-28 ENCOUNTER — Other Ambulatory Visit (HOSPITAL_COMMUNITY): Payer: Self-pay

## 2020-04-28 MED ORDER — DOXYCYCLINE HYCLATE 100 MG PO TABS
100.0000 mg | ORAL_TABLET | Freq: Two times a day (BID) | ORAL | 0 refills | Status: AC
  Filled 2020-04-28: qty 20, 10d supply, fill #0

## 2020-05-03 ENCOUNTER — Other Ambulatory Visit (HOSPITAL_COMMUNITY): Payer: Self-pay

## 2020-05-03 MED FILL — Valsartan Tab 160 MG: ORAL | 90 days supply | Qty: 135 | Fill #0 | Status: AC

## 2020-05-03 MED FILL — Sertraline HCl Tab 100 MG: ORAL | 90 days supply | Qty: 90 | Fill #0 | Status: AC

## 2020-05-05 ENCOUNTER — Other Ambulatory Visit (HOSPITAL_COMMUNITY): Payer: Self-pay

## 2020-05-06 ENCOUNTER — Other Ambulatory Visit (HOSPITAL_COMMUNITY): Payer: Self-pay

## 2020-05-06 ENCOUNTER — Other Ambulatory Visit: Payer: Self-pay

## 2020-05-06 ENCOUNTER — Other Ambulatory Visit: Payer: Self-pay | Admitting: Obstetrics and Gynecology

## 2020-05-09 ENCOUNTER — Other Ambulatory Visit (HOSPITAL_COMMUNITY): Payer: Self-pay

## 2020-05-09 ENCOUNTER — Other Ambulatory Visit: Payer: Self-pay | Admitting: *Deleted

## 2020-05-09 NOTE — Telephone Encounter (Signed)
Medication refill request: Estradiol vaginal tablets  Last AEX:  05-04-19 JK Next AEX: 06-17-20 ML Last MMG (if hormonal medication request): 05-19-19 BIRADS 2 benign  Refill authorized: Today, please advise.   Medication pended for #8, 0RF. Please refill if appropriate.

## 2020-05-13 ENCOUNTER — Other Ambulatory Visit (HOSPITAL_COMMUNITY): Payer: Self-pay

## 2020-05-13 MED ORDER — ESTRADIOL 10 MCG VA TABS
10.0000 ug | ORAL_TABLET | VAGINAL | 0 refills | Status: DC
  Filled 2020-05-13 – 2020-05-27 (×2): qty 8, 28d supply, fill #0

## 2020-05-23 ENCOUNTER — Other Ambulatory Visit (HOSPITAL_COMMUNITY): Payer: Self-pay

## 2020-05-25 ENCOUNTER — Other Ambulatory Visit (HOSPITAL_COMMUNITY): Payer: Self-pay

## 2020-05-25 MED ORDER — BUPROPION HCL ER (XL) 150 MG PO TB24
150.0000 mg | ORAL_TABLET | Freq: Every morning | ORAL | 0 refills | Status: DC
  Filled 2020-05-25: qty 90, 90d supply, fill #0

## 2020-05-27 ENCOUNTER — Other Ambulatory Visit (HOSPITAL_COMMUNITY): Payer: Self-pay

## 2020-05-27 ENCOUNTER — Other Ambulatory Visit: Payer: Self-pay | Admitting: Family Medicine

## 2020-05-27 DIAGNOSIS — I10 Essential (primary) hypertension: Secondary | ICD-10-CM

## 2020-06-17 ENCOUNTER — Encounter: Payer: Self-pay | Admitting: Obstetrics & Gynecology

## 2020-06-17 ENCOUNTER — Ambulatory Visit (INDEPENDENT_AMBULATORY_CARE_PROVIDER_SITE_OTHER): Payer: Medicare Other | Admitting: Obstetrics & Gynecology

## 2020-06-17 ENCOUNTER — Other Ambulatory Visit: Payer: Self-pay

## 2020-06-17 VITALS — BP 122/82 | Ht 66.5 in | Wt 196.0 lb

## 2020-06-17 DIAGNOSIS — M810 Age-related osteoporosis without current pathological fracture: Secondary | ICD-10-CM

## 2020-06-17 DIAGNOSIS — Z9189 Other specified personal risk factors, not elsewhere classified: Secondary | ICD-10-CM

## 2020-06-17 DIAGNOSIS — Z6831 Body mass index (BMI) 31.0-31.9, adult: Secondary | ICD-10-CM

## 2020-06-17 DIAGNOSIS — Z01419 Encounter for gynecological examination (general) (routine) without abnormal findings: Secondary | ICD-10-CM

## 2020-06-17 DIAGNOSIS — E6609 Other obesity due to excess calories: Secondary | ICD-10-CM

## 2020-06-17 DIAGNOSIS — Z780 Asymptomatic menopausal state: Secondary | ICD-10-CM

## 2020-06-17 NOTE — Progress Notes (Signed)
Sharon Daniels May 17, 1950 [id number]   History:    70 y.o. G3P2A1L2  RP:  Established patient presenting for annual gyn exam   HPI: Postmenopausal, on no systemic HRT.  No PMB.  No pelvic pain.  Well on Vagifem, helps with IC. Pap smear 04/2018. No significant history of abnormal Pap smears. Breasts normal.  Colonoscopy 2016.  Osteoporosis.  DEXA 03/2015.  She continues on Prolia.  No labs today as she normally has these completed with her primary care provider.  BMI 31.16.    Past medical history,surgical history, family history and social history were all reviewed and documented in the EPIC chart.  Gynecologic History No LMP recorded (lmp unknown). Patient is postmenopausal.  Obstetric History OB History  Gravida Para Term Preterm AB Living  3 2     1 2   SAB IAB Ectopic Multiple Live Births  1            # Outcome Date GA Lbr Len/2nd Weight Sex Delivery Anes PTL Lv  3 SAB           2 Para           1 Para              ROS: A ROS was performed and pertinent positives and negatives are included in the history.  GENERAL: No fevers or chills. HEENT: No change in vision, no earache, sore throat or sinus congestion. NECK: No pain or stiffness. CARDIOVASCULAR: No chest pain or pressure. No palpitations. PULMONARY: No shortness of breath, cough or wheeze. GASTROINTESTINAL: No abdominal pain, nausea, vomiting or diarrhea, melena or bright red blood per rectum. GENITOURINARY: No urinary frequency, urgency, hesitancy or dysuria. MUSCULOSKELETAL: No joint or muscle pain, no back pain, no recent trauma. DERMATOLOGIC: No rash, no itching, no lesions. ENDOCRINE: No polyuria, polydipsia, no heat or cold intolerance. No recent change in weight. HEMATOLOGICAL: No anemia or easy bruising or bleeding. NEUROLOGIC: No headache, seizures, numbness, tingling or weakness. PSYCHIATRIC: No depression, no loss of interest in normal activity or change in sleep pattern.     Exam:   BP 122/82   Ht 5'  6.5" (1.689 m)   Wt 196 lb (88.9 kg)   LMP  (LMP Unknown)   BMI 31.16 kg/m   Body mass index is 31.16 kg/m.  General appearance : Well developed well nourished female. No acute distress HEENT: Eyes: no retinal hemorrhage or exudates,  Neck supple, trachea midline, no carotid bruits, no thyroidmegaly Lungs: Clear to auscultation, no rhonchi or wheezes, or rib retractions  Heart: Regular rate and rhythm, no murmurs or gallops Breast:Examined in sitting and supine position were symmetrical in appearance, no palpable masses or tenderness,  no skin retraction, no nipple inversion, no nipple discharge, no skin discoloration, no axillary or supraclavicular lymphadenopathy Abdomen: no palpable masses or tenderness, no rebound or guarding Extremities: no edema or skin discoloration or tenderness  Pelvic: Vulva: Normal             Vagina: No gross lesions or discharge  Cervix: No gross lesions or discharge  Uterus  AV, normal size, shape and consistency, non-tender and mobile  Adnexa  Without masses or tenderness  Anus: Normal   Assessment/Plan:  70 y.o. female for annual exam   1. Well female exam with routine gynecological exam Normal gynecologic exam in menopause.  We will repeat a Pap test at next annual exam in 2023.  Breast exam normal.  Last mammogram May 2021 was  negative, will repeat mammogram this year.  Colonoscopy in 2016.  Health labs with family physician.  2. Postmenopause Well on no systemic hormone replacement therapy.  We will continue with Vagifem twice a week.  3. Postmenopausal osteoporosis Repeat BD now.  Continue on Prolia.  Recommend vitamin D supplements and calcium 1.5 g/day total to continue.  Weightbearing physical activities on a regular basis.  4. Class 1 obesity due to excess calories with serious comorbidity and body mass index (BMI) of 31.0 to 31.9 in adult  Recommend a lower calorie/carb diet.  Aerobic activities 5 times a week and light weightlifting  every 2 days.  Princess Bruins MD, 2:06 PM 06/17/2020

## 2020-06-19 ENCOUNTER — Encounter: Payer: Self-pay | Admitting: Obstetrics & Gynecology

## 2020-06-20 ENCOUNTER — Other Ambulatory Visit (HOSPITAL_COMMUNITY): Payer: Self-pay

## 2020-06-20 MED FILL — Rosuvastatin Calcium Tab 5 MG: ORAL | 90 days supply | Qty: 90 | Fill #0 | Status: AC

## 2020-06-20 MED FILL — Azelastine HCl Nasal Spray 0.1% (137 MCG/SPRAY): NASAL | 25 days supply | Qty: 30 | Fill #0 | Status: AC

## 2020-06-20 MED FILL — Montelukast Sodium Tab 10 MG (Base Equiv): ORAL | 90 days supply | Qty: 90 | Fill #0 | Status: AC

## 2020-06-21 ENCOUNTER — Ambulatory Visit
Admission: RE | Admit: 2020-06-21 | Discharge: 2020-06-21 | Disposition: A | Discharge status: Home / Self Care | Payer: No Typology Code available for payment source | Source: Ambulatory Visit | Attending: Family Medicine | Admitting: Family Medicine

## 2020-06-21 DIAGNOSIS — I10 Essential (primary) hypertension: Secondary | ICD-10-CM

## 2020-06-29 ENCOUNTER — Other Ambulatory Visit (HOSPITAL_COMMUNITY): Payer: Self-pay

## 2020-06-29 MED ORDER — NITROGLYCERIN 0.3 MG SL SUBL
SUBLINGUAL_TABLET | SUBLINGUAL | 0 refills | Status: DC
  Filled 2020-06-29: qty 100, 20d supply, fill #0

## 2020-06-29 MED ORDER — INTRAROSA 6.5 MG VA INST
6.5000 mg | VAGINAL_INSERT | Freq: Every day | VAGINAL | 11 refills | Status: DC
  Filled 2020-06-29: qty 28, 28d supply, fill #0
  Filled 2020-09-27: qty 28, 28d supply, fill #1
  Filled 2021-02-17: qty 28, 28d supply, fill #2

## 2020-06-29 MED ORDER — NITROGLYCERIN 0.4 MG SL SUBL
0.4000 mg | SUBLINGUAL_TABLET | SUBLINGUAL | 0 refills | Status: DC
  Filled 2020-06-29: qty 25, 25d supply, fill #0

## 2020-06-29 MED ORDER — ALPRAZOLAM 0.25 MG PO TABS
0.2500 mg | ORAL_TABLET | Freq: Three times a day (TID) | ORAL | 1 refills | Status: DC | PRN
  Filled 2020-06-29: qty 45, 12d supply, fill #0

## 2020-06-30 ENCOUNTER — Other Ambulatory Visit (HOSPITAL_COMMUNITY): Payer: Self-pay

## 2020-07-05 ENCOUNTER — Encounter (INDEPENDENT_AMBULATORY_CARE_PROVIDER_SITE_OTHER): Payer: Medicare Other

## 2020-07-05 ENCOUNTER — Other Ambulatory Visit: Payer: Self-pay

## 2020-07-05 ENCOUNTER — Other Ambulatory Visit: Payer: Self-pay | Admitting: Obstetrics & Gynecology

## 2020-07-05 DIAGNOSIS — M8589 Other specified disorders of bone density and structure, multiple sites: Secondary | ICD-10-CM

## 2020-07-05 DIAGNOSIS — Z780 Asymptomatic menopausal state: Secondary | ICD-10-CM | POA: Diagnosis not present

## 2020-07-08 ENCOUNTER — Ambulatory Visit: Payer: Medicare Other | Admitting: Cardiology

## 2020-07-08 ENCOUNTER — Other Ambulatory Visit (HOSPITAL_COMMUNITY): Payer: Self-pay

## 2020-07-08 ENCOUNTER — Encounter: Payer: Self-pay | Admitting: Cardiology

## 2020-07-08 ENCOUNTER — Other Ambulatory Visit: Payer: Self-pay

## 2020-07-08 VITALS — BP 147/91 | HR 82 | Temp 95.4°F | Resp 17 | Ht 66.5 in | Wt 196.4 lb

## 2020-07-08 DIAGNOSIS — E7800 Pure hypercholesterolemia, unspecified: Secondary | ICD-10-CM

## 2020-07-08 DIAGNOSIS — R0609 Other forms of dyspnea: Secondary | ICD-10-CM

## 2020-07-08 DIAGNOSIS — R0600 Dyspnea, unspecified: Secondary | ICD-10-CM

## 2020-07-08 DIAGNOSIS — I25118 Atherosclerotic heart disease of native coronary artery with other forms of angina pectoris: Secondary | ICD-10-CM

## 2020-07-08 DIAGNOSIS — I10 Essential (primary) hypertension: Secondary | ICD-10-CM

## 2020-07-08 DIAGNOSIS — R931 Abnormal findings on diagnostic imaging of heart and coronary circulation: Secondary | ICD-10-CM

## 2020-07-08 MED ORDER — METOPROLOL SUCCINATE ER 50 MG PO TB24
50.0000 mg | ORAL_TABLET | Freq: Every day | ORAL | 2 refills | Status: DC
Start: 2020-07-08 — End: 2020-08-16
  Filled 2020-07-08: qty 30, 30d supply, fill #0
  Filled 2020-08-09: qty 30, 30d supply, fill #1

## 2020-07-08 MED ORDER — ROSUVASTATIN CALCIUM 20 MG PO TABS
20.0000 mg | ORAL_TABLET | Freq: Every day | ORAL | 3 refills | Status: DC
  Filled 2020-07-08: qty 90, 90d supply, fill #0
  Filled 2020-10-14: qty 90, 90d supply, fill #1
  Filled 2021-03-08: qty 90, 90d supply, fill #2

## 2020-07-08 NOTE — Progress Notes (Signed)
Primary Physician/Referring:  [name], MD  Patient ID: Sharon Daniels, female    DOB: [birth year], 70 y.o.   MRN: [id number]  Chief Complaint  Patient presents with   coronary calcium score 98 percentile   Hypertension   HPI:    Sharon Daniels  is a 70 y.o. Caucasian female patient, who is a recovering alcoholic and has been abstinent from alcohol for 3 years now, she had been abstinent for 15 to 20 years and then had a relapse after her dad passed away but is now back on track, she also quit smoking and has remained abstinent referred to me for abnormal coronary calcium score noted on CT scan, dyspnea on exertion, marked exertional diaphoresis as anginal equivalent. 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   Patient has been extremely concerned about episodes of exertional marked diaphoresis that is abnormal that started only few months ago to a year ago.  The symptoms have been progressively getting worse.  She is also complaining of dyspnea on exertion but thinks that it is related to her prior history of tobacco use.  No PND or orthopnea, no leg edema.  Past Medical History:  Diagnosis Date   Acute pulmonary embolism ([place]) 06/13/2013   CT angio chest 06/12/13   ADHD    Allergy    Anemia    Arthritis    Complication of anesthesia    "during breast surgery: could hear the surgery, wake up too soon, anxious   COPD (chronic obstructive pulmonary disease) ([place])    Depression    hx of   DVT (deep venous thrombosis) ([place]) 2015   GERD (gastroesophageal reflux disease)    H/O bronchitis    Hard of hearing    History of kidney stones    x3   Hypertension    Osteoporosis    Pneumonia    hx of 10 years ago   PONV (postoperative nausea and vomiting)    "mild nausea"   Stress incontinence    Substance abuse ([place])    Wears glasses    Past Surgical  History:  Procedure Laterality Date   ABDOMINOPLASTY  09/2015   BREAST REDUCTION SURGERY  08/04/2015   BREAST REDUCTION SURGERY Bilateral 08/04/2015   Procedure: MAMMARY REDUCTION  (BREAST);  Surgeon: Crissie Reese, MD;  Location: [facility];  Service: Plastics;  Laterality: Bilateral;   BREAST SURGERY  1985   abscess   CARPAL TUNNEL RELEASE Left    CESAREAN SECTION  1989, [place]  2013   in the office   COSMETIC SURGERY     ear lobes shorten   HAMMERTOE RECONSTRUCTION WITH WEIL OSTEOTOMY Right 06/26/2018   Procedure: Right 2nd toe hammertoe correction; 2nd metatarsal Weil osteotomy;  Surgeon: Wylene Simmer, MD;  Location: [facility];  Service: Orthopedics;  Laterality: Right;  73min   NASAL SINUS SURGERY  2000   ORIF ANKLE FRACTURE Left 10/28/2018   Procedure: OPEN REDUCTION INTERNAL FIXATION (ORIF) LEFT ANKLE BIMALLEOLAR FRACTURE DISLOCATION;  Surgeon: Wylene Simmer, MD;  Location: [place];  Service: Orthopedics;  Laterality: Left;  42min   TOTAL KNEE ARTHROPLASTY Bilateral 06/05/2013   Procedure: BILATERAL TOTAL KNEE ARTHROPLASTY;  Surgeon: Gearlean Alf, MD;  Location: [facility];  Service: Orthopedics;  Laterality: Bilateral;   Family History  Problem Relation Age of Onset   Kidney disease Mother  Atrial fibrillation Mother    Breast cancer Mother    Ovarian cancer Mother    Heart attack Father    Colon cancer Neg Hx    Esophageal cancer Neg Hx    Stomach cancer Neg Hx    Rectal cancer Neg Hx     Social History   Tobacco Use   Smoking status: Former    Packs/day: 1.00    Years: 30.00    Pack years: 30.00    Types: Cigarettes    Quit date: 10/19/2017    Years since quitting: 2.7   Smokeless tobacco: Never   Tobacco comments:    Stop Smoking Oct..2019  Substance Use Topics   Alcohol use: Yes   Marital Status: Divorced  ROS  Review of Systems  Constitutional: Positive for diaphoresis (Exertional).  Cardiovascular:  Positive  for dyspnea on exertion. Negative for chest pain and leg swelling.  Gastrointestinal:  Negative for melena.  Objective  Blood pressure (!) 147/91, pulse 82, temperature (!) 95.4 F (35.2 C), temperature source Temporal, resp. rate 17, height 5' 6.5" (1.689 m), weight 196 lb 6.4 oz (89.1 kg), SpO2 98 %. Body mass index is 31.22 kg/m.  Vitals with BMI 07/08/2020 07/08/2020 06/17/2020  Height - 5' 6.5" 5' 6.5"  Weight - 196 lbs 6 oz 196 lbs  BMI - 24.09 73.53  Systolic 299 242 683  Diastolic 91 84 82  Pulse 82 81 -     Physical Exam Neck:     Vascular: No carotid bruit or JVD.  Cardiovascular:     Rate and Rhythm: Normal rate and regular rhythm.     Pulses: Intact distal pulses.     Heart sounds: Normal heart sounds. No murmur heard.   No gallop.  Pulmonary:     Effort: Pulmonary effort is normal.     Breath sounds: Normal breath sounds.  Abdominal:     General: Bowel sounds are normal.     Palpations: Abdomen is soft.  Musculoskeletal:        General: No swelling.     Laboratory examination:   Recent Labs    03/24/20 1339  CALCIUM 9.8   CrCl cannot be calculated (Patient's most recent lab result is older than the maximum 21 days allowed.).  CMP Latest Ref Rng & Units 03/24/2020 03/24/2019 10/20/2018  Glucose 70 - 99 mg/dL - - 106(H)  BUN 8 - 23 mg/dL - - 22  Creatinine 0.44 - 1.00 mg/dL - - 0.64  Sodium 135 - 145 mmol/L - - 138  Potassium 3.5 - 5.1 mmol/L - - 4.4  Chloride 98 - 111 mmol/L - - 106  CO2 22 - 32 mmol/L - - 24  Calcium 8.6 - 10.4 mg/dL 9.8 10.4 8.3(L)  Total Protein 6.1 - 8.1 g/dL - - -  Total Bilirubin 0.2 - 1.2 mg/dL - - -  Alkaline Phos 39 - 117 U/L - - -  AST 10 - 35 U/L - - -  ALT 6 - 29 U/L - - -   CBC Latest Ref Rng & Units 10/20/2018 07/27/2015 06/16/2013  WBC 4.0 - 10.5 K/uL 11.2(H) 7.6 8.4  Hemoglobin 12.0 - 15.0 g/dL 10.9(L) 12.1 8.3(L)  Hematocrit 36.0 - 46.0 % 33.9(L) 38.1 25.9(L)  Platelets 150 - 400 K/uL 235 289 476(H)   Lipid Panel No  results for input(s): CHOL, TRIG, LDLCALC, VLDL, HDL, CHOLHDL, LDLDIRECT in the last 8760 hours. Lipid Panel  No results found for: CHOL, TRIG, HDL, CHOLHDL, VLDL, LDLCALC,  LDLDIRECT, LABVLDL   HEMOGLOBIN A1C No results found for: HGBA1C, MPG TSH Recent Labs    10/28/19 1454  TSH 1.36    External labs:   Cholesterol, total 151.000 M 07/16/2019 HDL 53.000 MG 07/16/2019 LDL 85.000 MG 07/16/2019 Triglycerides 67.000 MG 07/16/2019  Hemoglobin 10.900 g/d 10/20/2018  Creatinine, Serum 0.930 MG/ 07/16/2019 Potassium 4.400 mm 10/20/2018 ALT (SGPT) 30.000 IU/ 07/16/2019  TSH 1.360 10/28/2019 Medications and allergies   Allergies  Allergen Reactions   Morphine Itching, Anxiety and Rash    IV only, made her mean     Medication prior to this encounter:   Outpatient Medications Prior to Visit  Medication Sig Dispense Refill   Ascorbic Acid (VITAMIN C) 500 MG CAPS Take 1 capsule by mouth daily.     aspirin 81 MG EC tablet Take 81 mg by mouth daily. Swallow whole.     atomoxetine (STRATTERA) 40 MG capsule TAKE 1 CAPSULE BY MOUTH ONCE DAILY IN THE MORNING 90 capsule 1   azelastine (ASTELIN) 0.1 % nasal spray PLACE 2 SPRAYS INTO EACH NOSTRIL 2 TIMES PER DAY 30 mL 11   budesonide-formoterol (SYMBICORT) 160-4.5 MCG/ACT inhaler Inhale 2 puffs into the lungs 2 (two) times daily.     buPROPion (WELLBUTRIN XL) 150 MG 24 hr tablet Take 1 tablet (150 mg total) by mouth every morning. 90 tablet 0   Cholecalciferol (VITAMIN D3) 1000 UNITS CAPS Take 1,000 Units by mouth daily.      docusate sodium (COLACE) 100 MG capsule Take 1 capsule (100 mg total) by mouth 2 (two) times daily. While taking narcotic pain medicine. 30 capsule 0   loratadine (CLARITIN) 10 MG tablet Take 10 mg by mouth daily.     montelukast (SINGULAIR) 10 MG tablet TAKE 1 TABLET BY MOUTH DAILY IN THE EVENING 90 tablet 3   Multiple Vitamin (MULTIVITAMIN WITH MINERALS) TABS tablet Take 1 tablet by mouth daily.     nitroGLYCERIN (NITROSTAT)  0.4 MG SL tablet Take 1 tablet sublingually if needed for chest pain, repeat in 5 minutes if needed and call EMS 25 tablet 0   NON FORMULARY Place 1 tablet under the tongue as needed. Hylands leg cramps     NON FORMULARY Take 1 capsule by mouth daily. Amberen multi-symptom menopause relief     omeprazole (PRILOSEC) 40 MG capsule Take 40 mg by mouth daily as needed (for acid reflux or heartburn). Reported on 04/21/2015     Prasterone (INTRAROSA) 6.5 MG INST Place 6.5 mg vaginally at bedtime. 28 each 11   Propylene Glycol 0.6 % SOLN Place 1 drop into both eyes as needed (Dry eyes). Reported on 04/21/2015     senna (SENOKOT) 8.6 MG TABS tablet Take 2 tablets (17.2 mg total) by mouth 2 (two) times daily. 30 tablet 0   sertraline (ZOLOFT) 100 MG tablet TAKE 1 AND 1/2 TABLET BY MOUTH DAILY 145 tablet 1   tretinoin (RETIN-A) 0.1 % cream APPLY TO FACIAL SKIN AT BEDTIME AS NEEDED & AS DIRECTED 45 g 2   valsartan (DIOVAN) 160 MG tablet TAKE 1 AND 1/2 TABLETS BY MOUTH DAILY. 145 tablet 1   rosuvastatin (CRESTOR) 5 MG tablet TAKE 1 TABLET BY MOUTH DAILY 90 tablet 3   denosumab (PROLIA) 60 MG/ML SOLN injection Inject 60 mg into the skin once. Administer in upper arm, thigh, or abdomen (Patient taking differently: Inject 60 mg into the skin every 6 (six) months. Administer in upper arm, thigh, or abdomen) 1 Syringe 0   ALPRAZolam Duanne Moron)  0.25 MG tablet Take 1-2 tablets (0.25-0.5 mg total) by mouth 3 (three) times daily as needed for panic attacks 45 tablet 1   Estradiol 10 MCG TABS vaginal tablet Place 1 tablet (10 mcg total) vaginally 2 (two) times a week. 8 tablet 0   psyllium (METAMUCIL) 58.6 % packet Take 1 packet by mouth daily as needed (For constipation.).     thiamine (VITAMIN B-1) 100 MG tablet Take 100 mg by mouth daily.     No facility-administered medications prior to visit.    FINAL MEDICATION AS OF TODAY:   Medications after current encounter Current Outpatient Medications  Medication  Instructions   Ascorbic Acid (VITAMIN C) 500 MG CAPS 1 capsule, Oral, Daily   aspirin 81 mg, Oral, Daily, Swallow whole.   atomoxetine (STRATTERA) 40 MG capsule TAKE 1 CAPSULE BY MOUTH ONCE DAILY IN THE MORNING   azelastine (ASTELIN) 0.1 % nasal spray PLACE 2 SPRAYS INTO EACH NOSTRIL 2 TIMES PER DAY   budesonide-formoterol (SYMBICORT) 160-4.5 MCG/ACT inhaler 2 puffs, Inhalation, 2 times daily   buPROPion (WELLBUTRIN XL) 150 mg, Oral, Every morning   denosumab (PROLIA) 60 mg, Subcutaneous,  Once, Administer in upper arm, thigh, or abdomen   docusate sodium (COLACE) 100 mg, Oral, 2 times daily, While taking narcotic pain medicine.   [place] 6.5 mg, Vaginal, Daily at bedtime   loratadine (CLARITIN) 10 mg, Oral, Daily   metoprolol succinate (TOPROL-XL) 50 mg, Oral, Daily, Take with or immediately following a meal.   montelukast (SINGULAIR) 10 MG tablet TAKE 1 TABLET BY MOUTH DAILY IN THE EVENING   Multiple Vitamin (MULTIVITAMIN WITH MINERALS) TABS tablet 1 tablet, Oral, Daily   nitroGLYCERIN (NITROSTAT) 0.4 MG SL tablet Take 1 tablet sublingually if needed for chest pain, repeat in 5 minutes if needed and call EMS   NON FORMULARY 1 tablet, Sublingual, As needed, [place]ands leg cramps   NON FORMULARY 1 capsule, Oral, Daily, Amberen multi-symptom menopause relief   omeprazole (PRILOSEC) 40 mg, Oral, Daily PRN, Reported on 04/21/2015   Propylene Glycol 0.6 % SOLN 1 drop, Both Eyes, As needed, Reported on 04/21/2015   rosuvastatin (CRESTOR) 20 mg, Oral, Daily   senna (SENOKOT) 17.2 mg, Oral, 2 times daily   sertraline (ZOLOFT) 100 MG tablet TAKE 1 AND 1/2 TABLET BY MOUTH DAILY   tretinoin (RETIN-A) 0.1 % cream APPLY TO FACIAL SKIN AT BEDTIME AS NEEDED & AS DIRECTED   valsartan (DIOVAN) 160 MG tablet TAKE 1 AND 1/2 TABLETS BY MOUTH DAILY.   Vitamin D3 1,000 Units, Oral, Daily    Radiology:   No results found.  Cardiac Studies:   Coronary calcium score 06/21/2020: LM: 50 LAD: 302 LCx: 7 RCA:  03/16/2020. Total at Agatston score 682.  MESA database percentile 96.  Ascending and descending thoracic aortic measurements normal.  Aortic atherosclerosis.  Visualized lung fields do not suggest any significant abnormality.  EKG:     EKG 07/08/2020: Normal sinus rhythm at rate of 77 bpm, borderline left atrial enlargement, leftward axis.  Single PVC.    Assessment     ICD-10-CM   1. Coronary artery disease involving native coronary artery of native heart with other form of angina pectoris ([place])  I25.118 PCV ECHOCARDIOGRAM COMPLETE    PCV MYOCARDIAL PERFUSION WO LEXISCAN    2. Agatston CAC score, >400: Total score 682, 96 percentile in [place] 06/21/2020  R93.1     3. Dyspnea on exertion  R06.00     4. Primary hypertension  I10 EKG  12-Lead    metoprolol succinate (TOPROL-XL) 50 MG 24 hr tablet    5. Hypercholesteremia  E78.00 rosuvastatin (CRESTOR) 20 MG tablet      Medications Discontinued During This Encounter  Medication Reason   thiamine (VITAMIN B-1) 100 MG tablet Error   psyllium (METAMUCIL) 58.6 % packet Error   Estradiol 10 MCG TABS vaginal tablet Error   ALPRAZolam (XANAX) 0.25 MG tablet Error   rosuvastatin (CRESTOR) 5 MG tablet Dose change    Meds ordered this encounter  Medications   metoprolol succinate (TOPROL-XL) 50 MG 24 hr tablet    Sig: Take 1 tablet (50 mg total) by mouth daily. Take with or immediately following a meal.    Dispense:  30 tablet    Refill:  2   rosuvastatin (CRESTOR) 20 MG tablet    Sig: Take 1 tablet (20 mg total) by mouth daily.    Dispense:  90 tablet    Refill:  3   Orders Placed This Encounter  Procedures   PCV MYOCARDIAL PERFUSION WO LEXISCAN    Standing Status:   Future    Standing Expiration Date:   09/08/2020   EKG 12-Lead   PCV ECHOCARDIOGRAM COMPLETE    Standing Status:   Future    Standing Expiration Date:   07/08/2021   Recommendations:   Sharon Daniels is a 70 y.o. Caucasian female patient, who is a recovering  alcoholic and has been abstinent from alcohol for 3 years now, she had been abstinent for 15 to 20 years and then had a relapse after her dad passed away but is now back on track, she also quit smoking and has remained abstinent referred to me for abnormal coronary calcium score noted on CT scan, dyspnea on exertion, marked exertional diaphoresis as anginal equivalent.  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  Patient has been extremely concerned about episodes of exertional marked diaphoresis that is abnormal that started only few months ago to a year ago.  Her exertional symptoms are very concerning for angina pectoris especially in view of markedly elevated coronary calcium score and her underlying cardiovascular risk factors including hyperlipidemia and hypertension and longstanding history of tobacco use (>40-pack-year history).  This is anginal equivalent symptom.  Her blood pressure is also uncontrolled and I will like to getting her LDL down to <70 and closer to 55 mg hence we will increase Crestor from 5 mg to 20 mg daily and also add metoprolol succinate 50 mg daily both for hypertension and CAD.  Will schedule for exercise nuclear stress test and an echocardiogram.  If low risk stress test, normal echocardiogram, her symptoms of marked diaphoresis that are although exertional may still be perimenopausal symptoms.  Her gynecologist has been reluctant in starting her on estrogen Zingo of history of DVT in the past.  We should be able to further stratify this depending upon her stress test.  I will see her back in 6 weeks for follow-up.   Adrian Prows, MD, [facility] 07/08/2020, 3:25 PM Office: [phone]

## 2020-08-01 ENCOUNTER — Other Ambulatory Visit: Payer: Self-pay

## 2020-08-01 ENCOUNTER — Ambulatory Visit: Payer: Medicare Other

## 2020-08-01 DIAGNOSIS — I25118 Atherosclerotic heart disease of native coronary artery with other forms of angina pectoris: Secondary | ICD-10-CM

## 2020-08-08 ENCOUNTER — Other Ambulatory Visit: Payer: Self-pay

## 2020-08-08 ENCOUNTER — Ambulatory Visit
Admission: RE | Admit: 2020-08-08 | Discharge: 2020-08-08 | Disposition: A | Discharge status: Home / Self Care | Payer: Medicare Other | Source: Ambulatory Visit | Attending: Family Medicine | Admitting: Family Medicine

## 2020-08-08 DIAGNOSIS — F17200 Nicotine dependence, unspecified, uncomplicated: Secondary | ICD-10-CM

## 2020-08-09 ENCOUNTER — Other Ambulatory Visit (HOSPITAL_COMMUNITY): Payer: Self-pay

## 2020-08-09 ENCOUNTER — Other Ambulatory Visit: Payer: Self-pay

## 2020-08-09 ENCOUNTER — Ambulatory Visit (INDEPENDENT_AMBULATORY_CARE_PROVIDER_SITE_OTHER): Payer: Medicare Other | Admitting: Family Medicine

## 2020-08-09 ENCOUNTER — Encounter (INDEPENDENT_AMBULATORY_CARE_PROVIDER_SITE_OTHER): Payer: Self-pay | Admitting: Family Medicine

## 2020-08-09 VITALS — BP 135/84 | HR 73 | Temp 98.0°F | Ht 67.0 in | Wt 192.0 lb

## 2020-08-09 DIAGNOSIS — F3289 Other specified depressive episodes: Secondary | ICD-10-CM

## 2020-08-09 DIAGNOSIS — I10 Essential (primary) hypertension: Secondary | ICD-10-CM

## 2020-08-09 DIAGNOSIS — Z862 Personal history of diseases of the blood and blood-forming organs and certain disorders involving the immune mechanism: Secondary | ICD-10-CM

## 2020-08-09 DIAGNOSIS — F908 Attention-deficit hyperactivity disorder, other type: Secondary | ICD-10-CM

## 2020-08-09 DIAGNOSIS — Z780 Asymptomatic menopausal state: Secondary | ICD-10-CM

## 2020-08-09 DIAGNOSIS — R0602 Shortness of breath: Secondary | ICD-10-CM

## 2020-08-09 DIAGNOSIS — F1011 Alcohol abuse, in remission: Secondary | ICD-10-CM

## 2020-08-09 DIAGNOSIS — Z6830 Body mass index (BMI) 30.0-30.9, adult: Secondary | ICD-10-CM

## 2020-08-09 DIAGNOSIS — E7849 Other hyperlipidemia: Secondary | ICD-10-CM

## 2020-08-09 DIAGNOSIS — F1010 Alcohol abuse, uncomplicated: Secondary | ICD-10-CM

## 2020-08-09 DIAGNOSIS — E669 Obesity, unspecified: Secondary | ICD-10-CM

## 2020-08-09 DIAGNOSIS — R5383 Other fatigue: Secondary | ICD-10-CM

## 2020-08-09 DIAGNOSIS — Z0289 Encounter for other administrative examinations: Secondary | ICD-10-CM

## 2020-08-09 DIAGNOSIS — J449 Chronic obstructive pulmonary disease, unspecified: Secondary | ICD-10-CM

## 2020-08-09 DIAGNOSIS — K760 Fatty (change of) liver, not elsewhere classified: Secondary | ICD-10-CM

## 2020-08-09 DIAGNOSIS — M818 Other osteoporosis without current pathological fracture: Secondary | ICD-10-CM

## 2020-08-09 DIAGNOSIS — Z9189 Other specified personal risk factors, not elsewhere classified: Secondary | ICD-10-CM

## 2020-08-09 DIAGNOSIS — E66811 Obesity, class 1: Secondary | ICD-10-CM

## 2020-08-09 DIAGNOSIS — E559 Vitamin D deficiency, unspecified: Secondary | ICD-10-CM

## 2020-08-09 DIAGNOSIS — R7303 Prediabetes: Secondary | ICD-10-CM

## 2020-08-09 MED FILL — Atomoxetine HCl Cap 40 MG (Base Equiv): ORAL | 90 days supply | Qty: 90 | Fill #0 | Status: AC

## 2020-08-10 ENCOUNTER — Other Ambulatory Visit (HOSPITAL_COMMUNITY): Payer: Self-pay

## 2020-08-10 LAB — COMPREHENSIVE METABOLIC PANEL
ALT: 24 IU/L (ref 0–32)
AST: 26 IU/L (ref 0–40)
Albumin/Globulin Ratio: 2 (ref 1.2–2.2)
Albumin: 4.8 g/dL (ref 3.8–4.8)
Alkaline Phosphatase: 69 IU/L (ref 44–121)
BUN/Creatinine Ratio: 18 (ref 12–28)
BUN: 15 mg/dL (ref 8–27)
Bilirubin Total: 0.3 mg/dL (ref 0.0–1.2)
CO2: 25 mmol/L (ref 20–29)
Calcium: 10.1 mg/dL (ref 8.7–10.3)
Chloride: 102 mmol/L (ref 96–106)
Creatinine, Ser: 0.83 mg/dL (ref 0.57–1.00)
Globulin, Total: 2.4 g/dL (ref 1.5–4.5)
Glucose: 109 mg/dL — ABNORMAL HIGH (ref 65–99)
Potassium: 4.9 mmol/L (ref 3.5–5.2)
Sodium: 141 mmol/L (ref 134–144)
Total Protein: 7.2 g/dL (ref 6.0–8.5)
eGFR: 77 mL/min/{1.73_m2} (ref >59)

## 2020-08-10 LAB — CBC WITH DIFFERENTIAL/PLATELET
Basophils Absolute: 0 10*3/uL (ref 0.0–0.2)
Basos: 0 %
EOS (ABSOLUTE): 0.4 10*3/uL (ref 0.0–0.4)
Eos: 6 %
Hematocrit: 37.8 % (ref 34.0–46.6)
Hemoglobin: 12.3 g/dL (ref 11.1–15.9)
Immature Grans (Abs): 0 10*3/uL (ref 0.0–0.1)
Immature Granulocytes: 0 %
Lymphocytes Absolute: 1.9 10*3/uL (ref 0.7–3.1)
Lymphs: 29 %
MCH: 32.5 pg (ref 26.6–33.0)
MCHC: 32.5 g/dL (ref 31.5–35.7)
MCV: 100 fL — ABNORMAL HIGH (ref 79–97)
Monocytes Absolute: 0.6 10*3/uL (ref 0.1–0.9)
Monocytes: 9 %
Neutrophils Absolute: 3.6 10*3/uL (ref 1.4–7.0)
Neutrophils: 56 %
Platelets: 242 10*3/uL (ref 150–450)
RBC: 3.79 x10E6/uL (ref 3.77–5.28)
RDW: 12.9 % (ref 11.7–15.4)
WBC: 6.5 10*3/uL (ref 3.4–10.8)

## 2020-08-10 LAB — HEMOGLOBIN A1C
Est. average glucose Bld gHb Est-mCnc: 126 mg/dL
Hgb A1c MFr Bld: 6 % — ABNORMAL HIGH (ref 4.8–5.6)

## 2020-08-10 LAB — VITAMIN B12: Vitamin B-12: 682 pg/mL (ref 232–1245)

## 2020-08-10 LAB — LIPID PANEL
Chol/HDL Ratio: 2.4 ratio (ref 0.0–4.4)
Cholesterol, Total: 146 mg/dL (ref 100–199)
HDL: 61 mg/dL (ref >39)
LDL Chol Calc (NIH): 70 mg/dL (ref 0–99)
Triglycerides: 79 mg/dL (ref 0–149)
VLDL Cholesterol Cal: 15 mg/dL (ref 5–40)

## 2020-08-10 LAB — T4, FREE: Free T4: 1.07 ng/dL (ref 0.82–1.77)

## 2020-08-10 LAB — TSH: TSH: 2.3 u[IU]/mL (ref 0.450–4.500)

## 2020-08-10 LAB — VITAMIN D 25 HYDROXY (VIT D DEFICIENCY, FRACTURES): Vit D, 25-Hydroxy: 59.9 ng/mL (ref 30.0–100.0)

## 2020-08-10 LAB — INSULIN, RANDOM: INSULIN: 12.6 u[IU]/mL (ref 2.6–24.9)

## 2020-08-11 ENCOUNTER — Other Ambulatory Visit (HOSPITAL_COMMUNITY): Payer: Self-pay

## 2020-08-11 MED FILL — Sertraline HCl Tab 100 MG: ORAL | 90 days supply | Qty: 135 | Fill #0 | Status: AC

## 2020-08-12 ENCOUNTER — Ambulatory Visit: Payer: Medicare Other | Admitting: Cardiology

## 2020-08-15 NOTE — Progress Notes (Signed)
Chief Complaint:   OBESITY Sharon Daniels (MR# [id number]) is a 70 y.o. female who presents for evaluation and treatment of obesity and related comorbidities. Current BMI is Body mass index is 30.07 kg/m. Sharon Daniels has been struggling with her weight for many years and has been unsuccessful in either losing weight, maintaining weight loss, or reaching her healthy weight goal.  Sharon Daniels is currently in the action stage of change and ready to dedicate time achieving and maintaining a healthier weight. Sharon Daniels is interested in becoming our patient and working on intensive lifestyle modifications including (but not limited to) diet and exercise for weight loss.  Sharon Daniels has previously seen an RD.  She says she needs convenience foods that are heart healthy.  She has paid for 1 year with a trainer, who told her, "you're obese and weak".  Sharon Daniels's habits were reviewed today and are as follows: her desired weight loss is 22 pounds, she has been heavy most of her life, she started gaining weight early in life, her heaviest weight ever was 199 pounds, she craves spicy foods, sweet foods, and pizza, she snacks frequently in the evenings, she skips breakfast and lunch sometimes, she is frequently drinking liquids with calories, she frequently makes poor food choices, she frequently eats larger portions than normal, and she struggles with emotional eating.  Depression Screen Sharon Daniels's Food and Mood (modified PHQ-9) score was 18.  Depression screen Sharon Daniels 2/9 08/09/2020  Decreased Interest 3  Down, Depressed, Hopeless 3  PHQ - 2 Score 6  Altered sleeping 1  Tired, decreased energy 1  Change in appetite 3  Feeling bad or failure about yourself  3  Trouble concentrating 3  Moving slowly or fidgety/restless 1  Suicidal thoughts 0  PHQ-9 Score 18  Difficult doing work/chores Somewhat difficult   Assessment/Plan:   1. Other fatigue Sharon Daniels denies daytime somnolence and admits to  waking up still tired. Patent has a history of symptoms of morning fatigue, morning headache, and snoring. Sharon Daniels generally gets  10-12  hours of sleep per night, and states that she has generally restful sleep once falling asleep.  She tends to stay up late. Snoring is present. Apneic episodes are not present. Epworth Sleepiness Score is 3.  Sharon Daniels does feel that her weight is causing her energy to be lower than it should be. Fatigue may be related to obesity, depression or many other causes. Labs will be ordered, and in the meanwhile, Sharon Daniels will focus on self care including making healthy food choices, increasing physical activity and focusing on stress reduction.  Will check labs today.  - CBC with Differential/Platelet - Comprehensive metabolic panel - T4, free - TSH - Vitamin B12  2. SOB (shortness of breath) on exertion Sharon Daniels notes increasing shortness of breath with exercising and seems to be worsening over time with weight gain. She notes getting out of breath sooner with activity than she used to. This has gotten worse recently. Sharon Daniels denies shortness of breath at rest or orthopnea.  Sharon Daniels does feel that she gets out of breath more easily that she used to when she exercises. Sharon Daniels shortness of breath appears to be obesity related and exercise induced. She has agreed to work on weight loss and gradually increase exercise to treat her exercise induced shortness of breath. Will continue to monitor closely.  3. Essential hypertension At goal. Medications: metoprolol 50 mg daily, valsartan 240 mg daily.   Plan: Avoid buying foods that are: processed, frozen, or prepackaged  to avoid excess salt. We will watch for signs of hypotension as she continues lifestyle modifications.  BP Readings from Last 3 Encounters:  08/09/20 135/84  07/08/20 (!) 147/91  06/17/20 122/82   4. Other hyperlipidemia Lipid-lowering medications: Crestor 20 mg daily.   Plan: Dietary  changes: Increase soluble fiber, decrease simple carbohydrates, decrease saturated fat. Exercise changes: Moderate to vigorous-intensity aerobic activity 150 minutes per week or as tolerated. We will continue to monitor along with PCP/specialists as it pertains to her weight loss journey.  Will check lipid panel today.  The 10-year ASCVD risk score Sharon Daniels DC Brooke Bonito., et al., 2013) is: 9.8%   Values used to calculate the score:     Age: 21 years     Sex: Female     Is Non-Hispanic African American: No     Diabetic: No     Tobacco smoker: No     Systolic Blood Pressure: [id number] mmHg     Is BP treated: Yes     HDL Cholesterol: 61 mg/dL     Total Cholesterol: 146 mg/dL  - Lipid panel  5. COPD, mild ([place]) We will continue to monitor symptoms as they relate to her weight loss journey.  6. Postmenopausal Sharon Daniels endorses hot flashes.  We will continue to monitor symptoms as they relate to her weight loss journey.  7. Other osteoporosis, unspecified pathological fracture presence Sharon Daniels is on Prolia injections every 6 months.  She is also taking OTC vitamin D 1,000 IU daily.  Plan:  Will check vitamin D level today.  - VITAMIN D 25 Hydroxy (Vit-D Deficiency, Fractures)  8. 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 Sober. No concerns today.  9. Vitamin D deficiency Sharon Daniels is taking OTC vitamin D 1,000 IU daily.  Plan: Will check vitamin D level today, as per below.  - VITAMIN D 25 Hydroxy (Vit-D Deficiency, Fractures)  10. Fatty liver NAFLD is an umbrella term that encompasses a disease spectrum that includes steatosis (fat) without inflammation, steatohepatitis (NASH; fat + inflammation in a characteristic pattern), and cirrhosis. Bland steatosis is felt to be a benign condition, with extremely low to no risk of progression to cirrhosis, whereas NASH can progress to cirrhosis. The mainstay of treatment of NAFLD includes lifestyle modification to achieve weight loss, at least  7% of current body weight. Low carbohydrate diets can be beneficial in improving NAFLD liver histology. Additionally, exercise, even the absence of weight loss can have beneficial effects on the patient's metabolic profile and liver health.   11. Prediabetes Not at goal. Goal is HgbA1c < 5.7.  Medication: None.    Plan:  She will continue to focus on protein-rich, low simple carbohydrate foods. We reviewed the importance of hydration, regular exercise for stress reduction, and restorative sleep.  Check labs today.  - Comprehensive metabolic panel - Hemoglobin A1c - Insulin, random  12. History of anemia Will check labs today, as per below.  CBC Latest Ref Rng & Units 10/20/2018 07/27/2015  WBC 3.4 - 10.8 x10E3/uL 11.2(H) 7.6  Hemoglobin 11.1 - 15.9 g/dL 10.9(L) 12.1  Hematocrit 34.0 - 46.6 % 33.9(L) 38.1  Platelets 150 - 450 x10E3/uL 235 289   Lab Results  Component Value Date   IRON 84 06/21/2005   TIBC 353 06/21/2005   FERRITIN 55 06/21/2005   - CBC with Differential/Platelet - Vitamin B12  13. Attention deficit hyperactivity disorder (ADHD), other type Kambra takes Strattera 40 mg daily for ADHD.  14. Other depression Not  at goal. Medication: Wellbutrin XL 150 mg daily and Zoloft 150 mg daily.  Nykole eats when stressed, when sad, as a reward, when bored, when feeling guilty, when angry, and as a comfort.  Plan:  Behavior modification techniques were discussed today to help deal with emotional/non-hunger eating behaviors.  15. Class 1 obesity with serious comorbidity and body mass index (BMI) of 30.0 to 30.9 in adult, unspecified obesity type  Sharon Daniels is currently in the action stage of change and her goal is to continue with weight loss efforts. I recommend Sharon Daniels begin the structured treatment plan as follows:  She has agreed to the Category 2 Plan.  Exercise goals: No exercise has been prescribed at this time.   Behavioral modification strategies:  increasing lean protein intake, decreasing simple carbohydrates, increasing vegetables, increasing water intake, decreasing liquid calories, decreasing alcohol intake, decreasing sodium intake, and increasing high fiber foods.  She was informed of the importance of frequent follow-up visits to maximize her success with intensive lifestyle modifications for her multiple health conditions. She was informed we would discuss her lab results at her next visit unless there is a critical issue that needs to be addressed sooner. Sharon Daniels agreed to keep her next visit at the agreed upon time to discuss these results.  Objective:   Blood pressure 135/84, pulse 73, temperature 98 F (36.7 C), temperature source Oral, height '5\' 7"'$  (1.702 m), weight 192 lb (87.1 kg), SpO2 96 %. Body mass index is 30.07 kg/m.  Indirect Calorimeter completed today shows a VO2 of 232 and a REE of 1598.  Her calculated basal metabolic rate is [id number] thus her basal metabolic rate is better than expected.  General: Cooperative, alert, well developed, in no acute distress. HEENT: Conjunctivae and lids unremarkable. Cardiovascular: Regular rhythm.  Lungs: Normal work of breathing. Neurologic: No focal deficits.   Lab Results  Component Value Date   VD25OH 54 04/26/2016   VD25OH 32 04/29/2015   Lab Results  Component Value Date   IRON 84 06/21/2005   TIBC 353 06/21/2005   FERRITIN 55 06/21/2005   Obesity Behavioral Intervention:   Approximately 15 minutes were spent on the discussion below.  ASK: We discussed the diagnosis of obesity with Sharon Daniels today and Blanca agreed to give [place] permission to discuss obesity behavioral modification therapy today.  ASSESS: Sharon Daniels has the diagnosis of obesity and her BMI today is 30.1. Sharon Daniels is in the action stage of change.   ADVISE: Sharon Daniels was educated on the multiple health risks of obesity as well as the benefit of weight loss to improve her health. She was  advised of the need for long term treatment and the importance of lifestyle modifications to improve her current health and to decrease her risk of future health problems.  AGREE: Multiple dietary modification options and treatment options were discussed and Sharon Daniels agreed to follow the recommendations documented in the above note.  ARRANGE: Sharon Daniels was educated on the importance of frequent visits to treat obesity as outlined per CMS and USPSTF guidelines and agreed to schedule her next follow up appointment today.  Attestation Statements:   This is the patient's first visit at [facility]. The patient's NEW PATIENT PACKET was reviewed at length. Included in the packet: current and past health history, medications, allergies, ROS, gynecologic history (Daniels only), surgical history, family history, social history, weight history, weight loss surgery history (for those that have had weight loss surgery), nutritional evaluation, mood and food questionnaire, PHQ9, Epworth questionnaire, sleep  habits questionnaire, patient life and health improvement goals questionnaire. These will all be scanned into the patient's chart under media.   During the visit, I independently reviewed the patient's EKG, bioimpedance scale results, and indirect calorimeter results. I used this information to tailor a meal plan for the patient that will help her to lose weight and will improve her obesity-related conditions going forward. I performed a medically necessary appropriate examination and/or evaluation. I discussed the assessment and treatment plan with the patient. The patient was provided an opportunity to ask questions and all were answered. The patient agreed with the plan and demonstrated an understanding of the instructions. Labs were ordered at this visit and will be reviewed at the next visit unless more critical results need to be addressed immediately. Clinical information was updated and  documented in the EMR.   I, Water quality scientist, CMA, am acting as transcriptionist for Briscoe Deutscher, DO  I have reviewed the above documentation for accuracy and completeness, and I agree with the above. Briscoe Deutscher, DO

## 2020-08-16 ENCOUNTER — Encounter: Payer: Self-pay | Admitting: Student

## 2020-08-16 ENCOUNTER — Other Ambulatory Visit: Payer: Self-pay

## 2020-08-16 ENCOUNTER — Ambulatory Visit: Payer: Medicare Other | Admitting: Student

## 2020-08-16 VITALS — BP 130/78 | HR 84 | Temp 97.3°F | Resp 16 | Ht 67.0 in | Wt 193.0 lb

## 2020-08-16 DIAGNOSIS — R0609 Other forms of dyspnea: Secondary | ICD-10-CM

## 2020-08-16 DIAGNOSIS — I10 Essential (primary) hypertension: Secondary | ICD-10-CM

## 2020-08-16 DIAGNOSIS — I25118 Atherosclerotic heart disease of native coronary artery with other forms of angina pectoris: Secondary | ICD-10-CM

## 2020-08-16 DIAGNOSIS — R931 Abnormal findings on diagnostic imaging of heart and coronary circulation: Secondary | ICD-10-CM

## 2020-08-16 DIAGNOSIS — E7800 Pure hypercholesterolemia, unspecified: Secondary | ICD-10-CM

## 2020-08-16 DIAGNOSIS — R0600 Dyspnea, unspecified: Secondary | ICD-10-CM

## 2020-08-16 MED ORDER — METOPROLOL SUCCINATE ER 50 MG PO TB24: 100.0000 mg | ORAL_TABLET | Freq: Every day | ORAL | 2 refills | Status: DC

## 2020-08-16 NOTE — Progress Notes (Signed)
Primary Physician/Referring:  [name], MD  Patient ID: Sharon Daniels, female    DOB: November 01, 1950, 70 y.o.   MRN: [id number]  Chief Complaint  Patient presents with   Coronary Artery Disease   Chest Pain        Hyperlipidemia   Follow-up    6 weeks   HPI:    Sharon Daniels  is a 70 y.o. 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  Originally referred to our office due to elevated coronary calcium score and dyspnea on exertion as well as marked exertional diaphoresis.  Patient was seen by Dr. Einar Gip 07/08/2020.  In view of elevated coronary calcium score and concern that patient's symptoms are anginal equivalent ordered echocardiogram and nuclear stress test.  Also last visit increase Crestor from 5 mg to 20 mg daily and added metoprolol succinate 50 mg daily.  Patient now presents for follow-up.  She is tolerating increased Crestor as well as addition of metoprolol without issue.  Unfortunately patient continues to have episodes of exertional diaphoresis as well as exertional dyspnea.  Denies chest pain, palpitations, orthopnea, PND, leg swelling.  Symptoms are relatively unchanged compared to last visit.  Past Medical History:  Diagnosis Date   Acute pulmonary embolism ([place]) 06/13/2013   CT angio chest 06/12/13   ADHD    Alcohol abuse    Allergy    Anemia    Anxiety    Arthritis    Back pain    Complication of anesthesia    "during breast surgery: could hear the surgery, wake up too soon, anxious   Constipation    COPD (chronic obstructive pulmonary disease) ([place])    Depression    hx of   DVT (deep venous thrombosis) ([place]) 2015   Fatty liver    Frequent sinus infections    GERD (gastroesophageal reflux disease)    H/O bronchitis    Hard of hearing    High cholesterol     History of kidney stones    x3   Hypertension    Joint pain    Osteoporosis    Pneumonia    hx of 10 years ago   PONV (postoperative nausea and vomiting)    "mild nausea"   Post-menopausal    Prediabetes    Stress incontinence    Substance abuse ([place])    Vitamin D deficiency    Wears glasses    Past Surgical History:  Procedure Laterality Date   ABDOMINOPLASTY  09/2015   BREAST REDUCTION SURGERY  08/04/2015   BREAST REDUCTION SURGERY Bilateral 08/04/2015   Procedure: MAMMARY REDUCTION  (BREAST);  Surgeon: Crissie Reese, MD;  Location: [place];  Service: Plastics;  Laterality: Bilateral;   BREAST SURGERY  1985   abscess   CARPAL TUNNEL RELEASE Left    CESAREAN SECTION  1989, [place]  2013   in the office   COSMETIC SURGERY     ear lobes shorten   CYSTECTOMY     HAMMERTOE RECONSTRUCTION WITH WEIL OSTEOTOMY Right 06/26/2018   Procedure: Right 2nd toe hammertoe correction; 2nd metatarsal Weil osteotomy;  Surgeon: Wylene Simmer, MD;  Location: [place];  Service: Orthopedics;  Laterality: Right;  19mn   NASAL SINUS SURGERY  2000   ORIF ANKLE FRACTURE Left 10/28/2018  Procedure: OPEN REDUCTION INTERNAL FIXATION (ORIF) LEFT ANKLE BIMALLEOLAR FRACTURE DISLOCATION;  Surgeon: Wylene Simmer, MD;  Location: [place];  Service: Orthopedics;  Laterality: Left;  50mn   TOTAL KNEE ARTHROPLASTY Bilateral 06/05/2013   Procedure: BILATERAL TOTAL KNEE ARTHROPLASTY;  Surgeon: FGearlean Alf MD;  Location: [facility];  Service: Orthopedics;  Laterality: Bilateral;   Family History  Problem Relation Age of Onset   Cancer Mother    Hyperlipidemia Mother    Hypertension Mother    Kidney disease Mother    Atrial fibrillation Mother    Breast cancer Mother    Ovarian cancer Mother    Heart disease Mother    Depression Mother    Alcohol abuse Mother    Heart disease Father    Hyperlipidemia Father    Hypertension Father    Heart attack Father     Colon cancer Neg Hx    Esophageal cancer Neg Hx    Stomach cancer Neg Hx    Rectal cancer Neg Hx     Social History   Tobacco Use   Smoking status: Former    Packs/day: 1.00    Years: 30.00    Pack years: 30.00    Types: Cigarettes    Quit date: 10/19/2017    Years since quitting: 2.8   Smokeless tobacco: Never   Tobacco comments:    Stop Smoking Oct..2019  Substance Use Topics   Alcohol use: Yes   Marital Status: Divorced  ROS  Review of Systems  Constitutional: Positive for diaphoresis (Exertional).  Cardiovascular:  Positive for dyspnea on exertion. Negative for chest pain, claudication, leg swelling, near-syncope, orthopnea, palpitations, paroxysmal nocturnal dyspnea and syncope.  Objective  Blood pressure 130/78, pulse 84, temperature (!) 97.3 F (36.3 C), temperature source Temporal, resp. rate 16, height '5\' 7"'$  (1.702 m), weight 193 lb (87.5 kg), SpO2 96 %. Body mass index is 30.23 kg/m.  Vitals with BMI 08/16/2020 08/09/2020 07/08/2020  Height '5\' 7"'$  '5\' 7"'$  -  Weight 193 lbs 192 lbs -  BMI [id number]  Systolic [id number] Diastolic 78 84 91  Pulse 84 73 82     Physical Exam Vitals reviewed.  HENT:     Head: Normocephalic and atraumatic.  Neck:     Vascular: No carotid bruit or JVD.  Cardiovascular:     Rate and Rhythm: Normal rate and regular rhythm.     Pulses: Intact distal pulses.     Heart sounds: Normal heart sounds, S1 normal and S2 normal. No murmur heard.   No gallop.  Pulmonary:     Effort: Pulmonary effort is normal.     Breath sounds: Normal breath sounds.  Musculoskeletal:        General: No swelling.     Right lower leg: No edema.     Left lower leg: No edema.  Neurological:     Mental Status: She is alert.     Laboratory examination:   Recent Labs    03/24/20 1339 08/09/20 0942  NA  --  141  K  --  4.9  CL  --  102  CO2  --  25  GLUCOSE  --  109*  BUN  --  15  CREATININE  --  0.83  CALCIUM 9.8 10.1   estimated creatinine  clearance is 73.7 mL/min (by C-G formula based on SCr of 0.83 mg/dL).  CMP Latest Ref Rng & Units 08/09/2020 03/24/2020 03/24/2019  Glucose 65 - 99 mg/dL 109(H) - -  BUN 8 - 27 mg/dL 15 - -  Creatinine 0.57 - 1.00 mg/dL 0.83 - -  Sodium 134 - 144 mmol/L 141 - -  Potassium 3.5 - 5.2 mmol/L 4.9 - -  Chloride 96 - 106 mmol/L 102 - -  CO2 20 - 29 mmol/L 25 - -  Calcium 8.7 - 10.3 mg/dL 10.1 9.8 10.4  Total Protein 6.0 - 8.5 g/dL 7.2 - -  Total Bilirubin 0.0 - 1.2 mg/dL 0.3 - -  Alkaline Phos 44 - 121 IU/L 69 - -  AST 0 - 40 IU/L 26 - -  ALT 0 - 32 IU/L 24 - -   CBC Latest Ref Rng & Units 08/09/2020 10/20/2018 07/27/2015  WBC 3.4 - 10.8 x10E3/uL 6.5 11.2(H) 7.6  Hemoglobin 11.1 - 15.9 g/dL 12.3 10.9(L) 12.1  Hematocrit 34.0 - 46.6 % 37.8 33.9(L) 38.1  Platelets 150 - 450 x10E3/uL 242 235 289   Lipid Panel Recent Labs    08/09/20 0942  CHOL 146  TRIG 79  LDLCALC 70  HDL 61  CHOLHDL 2.4   Lipid Panel     Component Value Date/Time   CHOL 146 08/09/2020 0942   TRIG 79 08/09/2020 0942   HDL 61 08/09/2020 0942   CHOLHDL 2.4 08/09/2020 0942   LDLCALC 70 08/09/2020 0942   LABVLDL 15 08/09/2020 0942     HEMOGLOBIN A1C Lab Results  Component Value Date   HGBA1C 6.0 (H) 08/09/2020   TSH Recent Labs    10/28/19 1454 08/09/20 0942  TSH 1.36 2.300    External labs:   Cholesterol, total 151.000 M 07/16/2019 HDL 53.000 MG 07/16/2019 LDL 85.000 MG 07/16/2019 Triglycerides 67.000 MG 07/16/2019  Hemoglobin 10.900 g/d 10/20/2018  Creatinine, Serum 0.930 MG/ 07/16/2019 Potassium 4.400 mm 10/20/2018 ALT (SGPT) 30.000 IU/ 07/16/2019  TSH 1.360 10/28/2019 Medications and allergies   Allergies  Allergen Reactions   Morphine Itching, Anxiety and Rash    IV only, made her mean     Medication prior to this encounter:   Outpatient Medications Prior to Visit  Medication Sig Dispense Refill   Ascorbic Acid (VITAMIN C) 500 MG CAPS Take 1 capsule by mouth daily.     aspirin 81 MG EC tablet  Take 81 mg by mouth daily. Swallow whole.     atomoxetine (STRATTERA) 40 MG capsule TAKE 1 CAPSULE BY MOUTH ONCE DAILY IN THE MORNING 90 capsule 1   buPROPion (WELLBUTRIN XL) 150 MG 24 hr tablet Take 1 tablet (150 mg total) by mouth every morning. 90 tablet 0   Cholecalciferol (VITAMIN D3) 1000 UNITS CAPS Take 1,000 Units by mouth daily.      loratadine (CLARITIN) 10 MG tablet Take 10 mg by mouth daily.     montelukast (SINGULAIR) 10 MG tablet TAKE 1 TABLET BY MOUTH DAILY IN THE EVENING 90 tablet 3   Multiple Vitamin (MULTIVITAMIN WITH MINERALS) TABS tablet Take 1 tablet by mouth daily.     nitroGLYCERIN (NITROSTAT) 0.4 MG SL tablet Take 1 tablet sublingually if needed for chest pain, repeat in 5 minutes if needed and call EMS 25 tablet 0   omeprazole (PRILOSEC) 40 MG capsule Take 40 mg by mouth daily as needed (for acid reflux or heartburn). Reported on 04/21/2015     Prasterone (INTRAROSA) 6.5 MG INST Place 6.5 mg vaginally at bedtime. 28 each 11   rosuvastatin (CRESTOR) 20 MG tablet Take 1 tablet (20 mg total) by mouth daily. 90 tablet 3   sertraline (ZOLOFT) 100 MG tablet Take  1.5 tablets (150 mg total) by mouth daily. 145 tablet 1   valsartan (DIOVAN) 160 MG tablet TAKE 1 AND 1/2 TABLETS BY MOUTH DAILY. 145 tablet 1   metoprolol succinate (TOPROL-XL) 50 MG 24 hr tablet Take 1 tablet (50 mg total) by mouth daily. Take with or immediately following a meal. 30 tablet 2   azelastine (ASTELIN) 0.1 % nasal spray PLACE 2 SPRAYS INTO EACH NOSTRIL 2 TIMES PER DAY 30 mL 11   denosumab (PROLIA) 60 MG/ML SOLN injection Inject 60 mg into the skin once. Administer in upper arm, thigh, or abdomen (Patient taking differently: Inject 60 mg into the skin every 6 (six) months. Administer in upper arm, thigh, or abdomen) 1 Syringe 0   No facility-administered medications prior to visit.    FINAL MEDICATION AS OF TODAY:   Medications after current encounter Current Outpatient Medications  Medication  Instructions   Ascorbic Acid (VITAMIN C) 500 MG CAPS 1 capsule, Oral, Daily   aspirin 81 mg, Oral, Daily, Swallow whole.   atomoxetine (STRATTERA) 40 MG capsule TAKE 1 CAPSULE BY MOUTH ONCE DAILY IN THE MORNING   azelastine (ASTELIN) 0.1 % nasal spray PLACE 2 SPRAYS INTO EACH NOSTRIL 2 TIMES PER DAY   buPROPion (WELLBUTRIN XL) 150 mg, Oral, Every morning   denosumab (PROLIA) 60 mg, Subcutaneous,  Once, Administer in upper arm, thigh, or abdomen   [place] 6.5 mg, Vaginal, Daily at bedtime   loratadine (CLARITIN) 10 mg, Oral, Daily   metoprolol succinate (TOPROL-XL) 100 mg, Oral, Daily, Take with or immediately following a meal.   montelukast (SINGULAIR) 10 MG tablet TAKE 1 TABLET BY MOUTH DAILY IN THE EVENING   Multiple Vitamin (MULTIVITAMIN WITH MINERALS) TABS tablet 1 tablet, Oral, Daily   nitroGLYCERIN (NITROSTAT) 0.4 MG SL tablet Take 1 tablet sublingually if needed for chest pain, repeat in 5 minutes if needed and call EMS   omeprazole (PRILOSEC) 40 mg, Oral, Daily PRN, Reported on 04/21/2015   rosuvastatin (CRESTOR) 20 mg, Oral, Daily   sertraline (ZOLOFT) 150 mg, Oral, Daily   valsartan (DIOVAN) 160 MG tablet TAKE 1 AND 1/2 TABLETS BY MOUTH DAILY.   Vitamin D3 1,000 Units, Oral, Daily    Radiology:   No results found.  Cardiac Studies:   Coronary calcium score 06/21/2020: LM: 50 LAD: 302 LCx: 7 RCA: 03/16/2020. Total at Agatston score 682.  MESA database percentile 96.  Ascending and descending thoracic aortic measurements normal.  Aortic atherosclerosis.  Visualized lung fields do not suggest any significant abnormality.  PCV ECHOCARDIOGRAM COMPLETE 08/01/2020 Left ventricle cavity is normal in size. Normal left ventricular wall thickness. Normal global wall motion. Doppler evidence of grade I (impaired) diastolic dysfunction, normal LAP. Normal LV systolic function with visual EF 55-60%. Calculated EF 55%. Left atrial cavity is mildly dilated at 4.3 cm. MV appears normal.  Normal thickness. Cannot r/o MVP. Moderate to severe anteriorly directed mitral regurgitation. Structurally normal tricuspid valve. No evidence of tricuspid stenosis. Mild tricuspid regurgitation. No evidence of pulmonary hypertension.   PCV MYOCARDIAL PERFUSION WO LEXISCAN 08/01/2020 Exercise nuclear stress test was performed using Bruce protocol. Patient reached 7.8 METS, and 88% of age predicted maximum heart rate. Exercise capacity was fair. No chest pain reported. Heart rate and hemodynamic response were normal. Stress EKG revealed no ischemic changes. SPECT images show decreased tracer uptake in inferior myocardium in rest and stress images, likely due to breast attenuation imaging performed in sitting position. In addition, there is a very small sized mild intensity,  reversible perfusion defect in apical inferolateral myocardium. Stress LVEF calculated as 43%, although visually appears normal. Low risk study.  EKG:     EKG 07/08/2020: Normal sinus rhythm at rate of 77 bpm, borderline left atrial enlargement, leftward axis.  Single PVC.    Assessment     ICD-10-CM   1. Agatston CAC score, >400: Total score 682, 96 percentile in [place] 06/21/2020  R93.1     2. Primary hypertension  I10 metoprolol succinate (TOPROL-XL) 50 MG 24 hr tablet    3. Dyspnea on exertion  R06.00 Ambulatory referral to Pulmonology    4. Hypercholesteremia  E78.00     5. Coronary artery disease involving native coronary artery of native heart with other form of angina pectoris ([place])  I25.118       Medications Discontinued During This Encounter  Medication Reason   metoprolol succinate (TOPROL-XL) 50 MG 24 hr tablet     Meds ordered this encounter  Medications   metoprolol succinate (TOPROL-XL) 50 MG 24 hr tablet    Sig: Take 2 tablets (100 mg total) by mouth daily. Take with or immediately following a meal.    Dispense:  30 tablet    Refill:  2   Orders Placed This Encounter  Procedures    Ambulatory referral to Pulmonology    Referral Priority:   Routine    Referral Type:   Consultation    Referral Reason:   Specialty Services Required    Requested Specialty:   Pulmonary Disease    Number of Visits Requested:   1    Recommendations:   KAYTLYNNE BASIL is a 70 y.o. 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  Originally referred to our office due to elevated coronary calcium score and dyspnea on exertion as well as marked exertional diaphoresis.  Patient now presents for follow-up.  Patient is tolerating increase Crestor as well as addition of metoprolol without issue.  We will repeat lipid profile testing in 3 months, LDL goal <70.  Reviewed and discussed with patient results of echocardiogram and stress test, details above.  We will increase metoprolol from 50 mg to 100 mg daily in order to further improve blood pressure control in view of underlying CAD.  Patient is otherwise on guideline directed medical therapy, will continue aspirin, rosuvastatin, valsartan.  Personally again reviewed stress test with Dr. Einar Gip. Given normal LVEF and perfusion images more consistent with breast attenuation, in context of her smoking history and chest CT with emphysema would recommend further evaluation of her dyspnea on exertion by pulmonology. If pulmonary work up is unyielding would consider heart catheterization at that point. Patient is presently on guideline directed medical therapy, will continue this.   Patient was seen in collaboration with Dr. Einar Gip and he is in agreement with this plan. Alethia Berthold, PA-C 08/18/2020, 1:20 PM Office: [phone]

## 2020-08-18 NOTE — Telephone Encounter (Signed)
From pt

## 2020-08-18 NOTE — Telephone Encounter (Signed)
Call patient and addressed her follow up questions in MyChart message. Also discussed recommendation for pulmonary review.

## 2020-08-19 ENCOUNTER — Ambulatory Visit: Payer: Medicare Other | Admitting: Cardiology

## 2020-08-23 ENCOUNTER — Encounter (INDEPENDENT_AMBULATORY_CARE_PROVIDER_SITE_OTHER): Payer: Self-pay | Admitting: Family Medicine

## 2020-08-23 ENCOUNTER — Other Ambulatory Visit: Payer: Self-pay

## 2020-08-23 ENCOUNTER — Ambulatory Visit (INDEPENDENT_AMBULATORY_CARE_PROVIDER_SITE_OTHER): Payer: Medicare Other | Admitting: Family Medicine

## 2020-08-23 VITALS — BP 143/81 | HR 70 | Temp 98.4°F | Ht 67.0 in | Wt 189.0 lb

## 2020-08-23 DIAGNOSIS — F39 Unspecified mood [affective] disorder: Secondary | ICD-10-CM | POA: Diagnosis not present

## 2020-08-23 DIAGNOSIS — R7301 Impaired fasting glucose: Secondary | ICD-10-CM

## 2020-08-23 DIAGNOSIS — Z6830 Body mass index (BMI) 30.0-30.9, adult: Secondary | ICD-10-CM

## 2020-08-23 DIAGNOSIS — E669 Obesity, unspecified: Secondary | ICD-10-CM

## 2020-08-23 DIAGNOSIS — J438 Other emphysema: Secondary | ICD-10-CM

## 2020-08-24 ENCOUNTER — Other Ambulatory Visit (HOSPITAL_COMMUNITY): Payer: Self-pay

## 2020-08-24 ENCOUNTER — Other Ambulatory Visit: Payer: Self-pay | Admitting: Cardiology

## 2020-08-24 ENCOUNTER — Encounter (INDEPENDENT_AMBULATORY_CARE_PROVIDER_SITE_OTHER): Payer: Self-pay | Admitting: Family Medicine

## 2020-08-24 DIAGNOSIS — I10 Essential (primary) hypertension: Secondary | ICD-10-CM

## 2020-08-24 MED ORDER — METOPROLOL SUCCINATE ER 50 MG PO TB24
100.0000 mg | ORAL_TABLET | Freq: Every day | ORAL | 2 refills | Status: DC
  Filled 2020-08-24 (×2): qty 180, 90d supply, fill #0
  Filled 2021-01-12: qty 180, 90d supply, fill #1
  Filled 2021-04-05 – 2021-04-06 (×2): qty 180, 90d supply, fill #2

## 2020-08-24 MED ORDER — METOPROLOL SUCCINATE ER 50 MG PO TB24: 100.0000 mg | ORAL_TABLET | Freq: Every day | ORAL | 2 refills | Status: DC

## 2020-08-25 ENCOUNTER — Encounter (INDEPENDENT_AMBULATORY_CARE_PROVIDER_SITE_OTHER): Payer: Self-pay | Admitting: Family Medicine

## 2020-08-25 ENCOUNTER — Other Ambulatory Visit (INDEPENDENT_AMBULATORY_CARE_PROVIDER_SITE_OTHER): Payer: Self-pay | Admitting: Family Medicine

## 2020-08-25 ENCOUNTER — Other Ambulatory Visit (HOSPITAL_COMMUNITY): Payer: Self-pay

## 2020-08-25 DIAGNOSIS — R7301 Impaired fasting glucose: Secondary | ICD-10-CM

## 2020-08-25 MED ORDER — TIRZEPATIDE 2.5 MG/0.5ML ~~LOC~~ SOAJ
2.5000 mg | SUBCUTANEOUS | 0 refills | Status: DC
  Filled 2020-08-25: qty 2, 28d supply, fill #0

## 2020-08-26 ENCOUNTER — Other Ambulatory Visit (HOSPITAL_COMMUNITY): Payer: Self-pay

## 2020-08-26 NOTE — Progress Notes (Signed)
Chief Complaint:   OBESITY Sharon Daniels is here to discuss her progress with her obesity treatment plan along with follow-up of her obesity related diagnoses.   Today's visit was #: 2 Starting weight: 192 lbs Starting date: 08/09/2020 Today's weight: 189 lbs Today's date: 08/23/2020 Weight change since last visit: -3 lbs Total lbs lost to date: 3 lbs Body mass index is 29.6 kg/m.  Total weight loss percentage to date: 1.56%  Current Meal Plan: the Category 2 Plan for 86% of the time.  Current Exercise Plan: None at this time. Current Anti-Obesity Medications: None.  Interim History:Sharon Daniels recently saw her cardiologist for potential emphysema, and she will begin [facility], which is a home/web based cardiopulmonary rehab service.   Assessment/Plan:   1. Impaired fasting glucose Not Optimized. A1C is 6.0. Start Mounjaro 2.'5mg'$  once weekly injection. After discussion, patient would like to start below medication. Expectations, risks, and potential side effects reviewed.   - tirzepatide [facility]) 2.5 MG/0.5ML Pen; Inject 2.5 mg into the skin once a week.  Dispense: 2 mL; Refill: 0  ADDENDUM: Unable to obtain Mounjaro with coupon due to Medicare status. Staff working to clarify with patient. Will see if Ozempic can be approved.   2. Other emphysema ([place]) Sharon Daniels will be beginning [facility] home/web based cardiopulmonary rehab.  3. Mood disorder (emotional eating) Not at goal. Medication: None.  Plan:  Motivational interviewing as well as evidence-based interventions for health behavior change were utilized today including the discussion of self monitoring techniques, problem-solving barriers and SMART goal setting techniques.    4. Obesity with current BMI of 29.6 Course: Sharon Daniels is currently in the action stage of change. As such, her goal is to continue with weight loss efforts.   Nutrition goals: She has agreed to keeping a food journal and adhering to recommended goals of  1200 calories and 95g protein.   Exercise goals:  [facility]  Behavioral modification strategies: increasing lean protein intake, decreasing simple carbohydrates, increasing vegetables, and increasing water intake.  Sharon Daniels has agreed to follow-up with our clinic in 3 weeks. She was informed of the importance of frequent follow-up visits to maximize her success with intensive lifestyle modifications for her multiple health conditions.  Objective:   Blood pressure (!) 143/81, pulse 70, temperature 98.4 F (36.9 C), temperature source Oral, height '5\' 7"'$  (1.702 m), weight 189 lb (85.7 kg), SpO2 98 %. Body mass index is 29.6 kg/m.  General: Cooperative, alert, well developed, in no acute distress. HEENT: Conjunctivae and lids unremarkable. Cardiovascular: Regular rhythm.  Lungs: Normal work of breathing. Neurologic: No focal deficits.   Lab Results  Component Value Date   CREATININE 0.83 08/09/2020   BUN 15 08/09/2020   NA 141 08/09/2020   K 4.9 08/09/2020   CL 102 08/09/2020   CO2 25 08/09/2020   Lab Results  Component Value Date   ALT 24 08/09/2020   AST 26 08/09/2020   ALKPHOS 69 08/09/2020   BILITOT 0.3 08/09/2020   Lab Results  Component Value Date   HGBA1C 6.0 (H) 08/09/2020   Lab Results  Component Value Date   INSULIN 12.6 08/09/2020   Lab Results  Component Value Date   TSH 2.300 08/09/2020   Lab Results  Component Value Date   CHOL 146 08/09/2020   HDL 61 08/09/2020   LDLCALC 70 08/09/2020   TRIG 79 08/09/2020   CHOLHDL 2.4 08/09/2020   Lab Results  Component Value Date   VD25OH 59.9 08/09/2020   VD25OH 54  04/26/2016   VD25OH 32 04/29/2015   Lab Results  Component Value Date   WBC 6.5 08/09/2020   HGB 12.3 08/09/2020   HCT 37.8 08/09/2020   MCV 100 (H) 08/09/2020   PLT 242 08/09/2020   Lab Results  Component Value Date   IRON 84 06/21/2005   TIBC 353 06/21/2005   FERRITIN 55 06/21/2005    Obesity Behavioral Intervention:    Approximately 15 minutes were spent on the discussion below.  ASK: We discussed the diagnosis of obesity with Sharon Daniels today and Sharon Daniels agreed to give [place] permission to discuss obesity behavioral modification therapy today.  ASSESS: Sharon Daniels has the diagnosis of obesity and her BMI today is 29.6. Sharon Daniels is in the action stage of change.   ADVISE: Sharon Daniels was educated on the multiple health risks of obesity as well as the benefit of weight loss to improve her health. She was advised of the need for long term treatment and the importance of lifestyle modifications to improve her current health and to decrease her risk of future health problems.  AGREE: Multiple dietary modification options and treatment options were discussed and Sharon Daniels agreed to follow the recommendations documented in the above note.  ARRANGE: Sharon Daniels was educated on the importance of frequent visits to treat obesity as outlined per CMS and USPSTF guidelines and agreed to schedule her next follow up appointment today.  Attestation Statements:   Reviewed by clinician on day of visit: allergies, medications, problem list, medical history, surgical history, family history, social history, and previous encounter notes.  I, Noah Mincy,LPN am acting as Location manager for [employer], DO.  I have reviewed the above documentation for accuracy and completeness, and I agree with the above. Briscoe Deutscher, DO

## 2020-08-29 ENCOUNTER — Other Ambulatory Visit (HOSPITAL_COMMUNITY): Payer: Self-pay

## 2020-08-29 NOTE — Telephone Encounter (Signed)
Pt last seen by Dr. Wallace.  

## 2020-08-29 NOTE — Telephone Encounter (Signed)
Dr.Wallace °

## 2020-08-31 ENCOUNTER — Other Ambulatory Visit (HOSPITAL_COMMUNITY): Payer: Self-pay

## 2020-08-31 MED ORDER — OZEMPIC (0.25 OR 0.5 MG/DOSE) 2 MG/1.5ML ~~LOC~~ SOPN
0.2500 mg | PEN_INJECTOR | SUBCUTANEOUS | 0 refills | Status: DC
  Filled 2020-08-31: qty 1.5, 56d supply, fill #0

## 2020-08-31 NOTE — Progress Notes (Signed)
Synopsis: Referred for DOE by Alethia Berthold, PA*  Subjective:   PATIENT ID: Sharon Daniels GENDER: female DOB: Dec 24, 1950, MRN: [id number]  Chief Complaint  Patient presents with   Consult    Referred for DOE   69yF with history of PE 06/2013, DVT followoing bilateral knee replacement, COPD, HTN, alcohol use disorder in remission, CAD, diastolic dysfunction, 30 py smoker quit 2019, sinus surgery for recurrent sinusitis referred by cardiology for evaluation of DOE. Had stress test which showed small reversible perfusion defect and stress LVEF of 43% but symptoms felt to be out of proportion to these findings.  She says that she used to get bronchitis and pneumonia a lot. She thinks she has noticed some wheezing with exertion recently when gardening or walking. She has only had prednisone prescirbed for sinus infections. No hospitalizations ever for AECOPD. She rarely has a cough. DOE to a half mile when it's hot outside. Does feel some associated chest tightness but not pain. No orthopnea, BLE swelling  Mentions some associated excessive diaphoresis with exertion.   She has stopped smoking.    Otherwise pertinent review of systems is negative.  Mother used oxygen for last 8 years of her life but she is unsure why. No lung cancer in her family.   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 She has a dog, no pet birds. She lived for 5 years in [place], 4 years [place] in [place].     Past Medical History:  Diagnosis Date   Acute pulmonary embolism ([place]) 06/13/2013   CT angio chest 06/12/13   ADHD    Alcohol abuse    Allergy    Anemia    Anxiety    Arthritis    Back pain    Complication of anesthesia    "during breast surgery: could hear the surgery, wake up too soon, anxious   Constipation    COPD (chronic obstructive pulmonary disease) ([place])    Depression    hx of   DVT (deep venous thrombosis) ([place]) 2015   Fatty liver    Frequent  sinus infections    GERD (gastroesophageal reflux disease)    H/O bronchitis    Hard of hearing    High cholesterol    History of kidney stones    x3   Hypertension    Joint pain    Osteoporosis    Pneumonia    hx of 10 years ago   PONV (postoperative nausea and vomiting)    "mild nausea"   Post-menopausal    Prediabetes    Stress incontinence    Substance abuse ([place])    Vitamin D deficiency    Wears glasses      Family History  Problem Relation Age of Onset   Cancer Mother    Hyperlipidemia Mother    Hypertension Mother    Kidney disease Mother    Atrial fibrillation Mother    Breast cancer Mother    Ovarian cancer Mother    Heart disease Mother    Depression Mother    Alcohol abuse Mother    Heart disease Father    Hyperlipidemia Father    Hypertension Father    Heart attack Father    Colon cancer Neg Hx    Esophageal cancer Neg Hx    Stomach cancer Neg Hx    Rectal cancer Neg Hx      Past Surgical History:  Procedure Laterality Date  ABDOMINOPLASTY  09/2015   BREAST REDUCTION SURGERY  08/04/2015   BREAST REDUCTION SURGERY Bilateral 08/04/2015   Procedure: MAMMARY REDUCTION  (BREAST);  Surgeon: Crissie Reese, MD;  Location: [facility];  Service: Plastics;  Laterality: Bilateral;   BREAST SURGERY  1985   abscess   CARPAL TUNNEL RELEASE Left    CESAREAN SECTION  1989, [place]  2013   in the office   COSMETIC SURGERY     ear lobes shorten   CYSTECTOMY     HAMMERTOE RECONSTRUCTION WITH WEIL OSTEOTOMY Right 06/26/2018   Procedure: Right 2nd toe hammertoe correction; 2nd metatarsal Weil osteotomy;  Surgeon: Wylene Simmer, MD;  Location: [facility];  Service: Orthopedics;  Laterality: Right;  2mn   NASAL SINUS SURGERY  2000   ORIF ANKLE FRACTURE Left 10/28/2018   Procedure: OPEN REDUCTION INTERNAL FIXATION (ORIF) LEFT ANKLE BIMALLEOLAR FRACTURE DISLOCATION;  Surgeon: HWylene Simmer MD;  Location: [facility]  Service:  Orthopedics;  Laterality: Left;  956m   TOTAL KNEE ARTHROPLASTY Bilateral 06/05/2013   Procedure: BILATERAL TOTAL KNEE ARTHROPLASTY;  Surgeon: FrGearlean AlfMD;  Location: [facility];  Service: Orthopedics;  Laterality: Bilateral;    Social History   Socioeconomic History   Marital status: Divorced    Spouse name: Not on file   Number of children: 2   Years of education: Not on file   Highest education level: Not on file  Occupational History   Occupation: SoEducation officer, museum Occupation: Retired ReCabin crewTobacco Use   Smoking status: Former    Packs/day: 1.00    Years: 30.00    Pack years: 30.00    Types: Cigarettes    Quit date: 10/19/2017    Years since quitting: 2.8   Smokeless tobacco: Never   Tobacco comments:    Stop Smoking Oct..2019  Vaping Use   Vaping Use: Former  Substance and Sexual Activity   Alcohol use: Yes   Drug use: No    Comment: hx of cocaine, none 1982   Sexual activity: Not Currently    Birth control/protection: Post-menopausal    Comment: 1st intercourse 1736o-more than 5 partners  Other Topics Concern   Not on file  Social History Narrative   Not on file   Social Determinants of Health   Financial Resource Strain: Not on file  Food Insecurity: Not on file  Transportation Needs: Not on file  Physical Activity: Not on file  Stress: Not on file  Social Connections: Not on file  Intimate Partner Violence: Not on file     Allergies  Allergen Reactions   Morphine Itching, Anxiety and Rash    IV only, made her mean     Outpatient Medications Prior to Visit  Medication Sig Dispense Refill   Ascorbic Acid (VITAMIN C) 500 MG CAPS Take 1 capsule by mouth daily.     aspirin 81 MG EC tablet Take 81 mg by mouth daily. Swallow whole.     atomoxetine (STRATTERA) 40 MG capsule TAKE 1 CAPSULE BY MOUTH ONCE DAILY IN THE MORNING 90 capsule 1   buPROPion (WELLBUTRIN XL) 150 MG 24 hr tablet Take 1 tablet (150 mg total) by mouth every morning. 90 tablet 0    Cholecalciferol (VITAMIN D3) 1000 UNITS CAPS Take 1,000 Units by mouth daily.      loratadine (CLARITIN) 10 MG tablet Take 10 mg by mouth daily.     metoprolol succinate (TOPROL-XL) 50 MG  24 hr tablet Take 2 tablets (100 mg total) by mouth daily. Take with or immediately following a meal. 180 tablet 2   montelukast (SINGULAIR) 10 MG tablet TAKE 1 TABLET BY MOUTH DAILY IN THE EVENING 90 tablet 3   Multiple Vitamin (MULTIVITAMIN WITH MINERALS) TABS tablet Take 1 tablet by mouth daily.     nitroGLYCERIN (NITROSTAT) 0.4 MG SL tablet Take 1 tablet sublingually if needed for chest pain, repeat in 5 minutes if needed and call EMS 25 tablet 0   omeprazole (PRILOSEC) 40 MG capsule Take 40 mg by mouth daily as needed (for acid reflux or heartburn). Reported on 04/21/2015     Prasterone (INTRAROSA) 6.5 MG INST Place 6.5 mg vaginally at bedtime. 28 each 11   rosuvastatin (CRESTOR) 20 MG tablet Take 1 tablet (20 mg total) by mouth daily. 90 tablet 3   sertraline (ZOLOFT) 100 MG tablet Take 1.5 tablets (150 mg total) by mouth daily. 145 tablet 1   valsartan (DIOVAN) 160 MG tablet TAKE 1 AND 1/2 TABLETS BY MOUTH DAILY. 145 tablet 1   azelastine (ASTELIN) 0.1 % nasal spray PLACE 2 SPRAYS INTO EACH NOSTRIL 2 TIMES PER DAY 30 mL 11   denosumab (PROLIA) 60 MG/ML SOLN injection Inject 60 mg into the skin once. Administer in upper arm, thigh, or abdomen (Patient taking differently: Inject 60 mg into the skin every 6 (six) months. Administer in upper arm, thigh, or abdomen) 1 Syringe 0   Semaglutide,0.25 or 0.'5MG'$ /DOS, (OZEMPIC, 0.25 OR 0.5 MG/DOSE,) 2 MG/1.5ML SOPN Inject 0.25 mg into the skin once a week. (Patient not taking: Reported on 09/01/2020) 1.5 mL 0   No facility-administered medications prior to visit.       Objective:   Physical Exam:  General appearance: 70 y.o., female, NAD, conversant  Eyes: anicteric sclerae, moist conjunctivae; no lid-lag; PERRL, tracking appropriately HENT: NCAT; oropharynx,  MMM, no mucosal ulcerations; normal hard and soft palate Neck: Trachea midline; no lymphadenopathy, no JVD Lungs: CTAB, no crackles, no wheeze, with normal respiratory effort CV: RRR, no MRGs  Abdomen: Soft, non-tender; non-distended, BS present  Extremities: No peripheral edema, radial and DP pulses present bilaterally  Skin: Normal temperature, turgor and texture; no rash Psych: Appropriate affect Neuro: Alert and oriented to person and place, no focal deficit    Vitals:   09/01/20 1338  BP: 124/72  Pulse: 77  SpO2: 96%  Weight: 192 lb 12.8 oz (87.5 kg)  Height: '5\' 7"'$  (1.702 m)   96% on RA BMI Readings from Last 3 Encounters:  09/01/20 30.20 kg/m  08/23/20 29.60 kg/m  08/16/20 30.23 kg/m   Wt Readings from Last 3 Encounters:  09/01/20 192 lb 12.8 oz (87.5 kg)  08/23/20 189 lb (85.7 kg)  08/16/20 193 lb (87.5 kg)     CBC    Component Value Date/Time   WBC 6.5 08/09/2020 0942   WBC 11.2 (H) 10/20/2018 1245   RBC 3.79 08/09/2020 0942   RBC 3.24 (L) 10/20/2018 1245   HGB 12.3 08/09/2020 0942   HGB 11.6 12/12/2005 0843   HCT 37.8 08/09/2020 0942   HCT 34.5 (L) 12/12/2005 0843   PLT 242 08/09/2020 0942   MCV 100 (H) 08/09/2020 0942   MCV 95.0 12/12/2005 0843   MCH 32.5 08/09/2020 0942   MCH 33.6 10/20/2018 1245   MCHC 32.5 08/09/2020 0942   MCHC 32.2 10/20/2018 1245   RDW 12.9 08/09/2020 0942   RDW 14.2 12/12/2005 0843   LYMPHSABS 1.9 08/09/2020 0942  LYMPHSABS 1.8 12/12/2005 0843   MONOABS 0.8 10/20/2018 1245   MONOABS 0.6 12/12/2005 0843   EOSABS 0.4 08/09/2020 0942   BASOSABS 0.0 08/09/2020 0942   BASOSABS 0.1 12/12/2005 0843    Low level eosinophilia historically  Chest Imaging: 08/08/20 lung cancer screening CT reviewed by me and remarkable for centrilobular emphysema, scattered small nodules (<35m), some small foci of scarring  Pulmonary Functions Testing Results: No flowsheet data found.   Echocardiogram:   TTE 08/01/20: G1DD       Assessment & Plan:   # DOE: Has emphysema and certainly could have COPD. Agree that we should check some PFTs and likely a trial of an inhaler to get a sense of what extent her activity limitation is due to COPD vs cardiac (whether CAD or diastolic dysfunction). Not an exacerbator and no chronic cough.  Plan: - PFTs - likely plan on trial of LABA/LAMA once PFT results come back - she is probably overall doing a little too well to warrant referral to pulmonary rehab at this time - ambulatory referral to lung cancer screening program placed today     NMaryjane Hurter MD [facility] 09/01/2020 1:41 PM

## 2020-09-01 ENCOUNTER — Other Ambulatory Visit: Payer: Self-pay

## 2020-09-01 ENCOUNTER — Encounter: Payer: Self-pay | Admitting: Student

## 2020-09-01 ENCOUNTER — Other Ambulatory Visit (HOSPITAL_COMMUNITY): Payer: Self-pay

## 2020-09-01 ENCOUNTER — Ambulatory Visit: Payer: Medicare Other | Admitting: Student

## 2020-09-01 VITALS — BP 124/72 | HR 77 | Ht 67.0 in | Wt 192.8 lb

## 2020-09-01 DIAGNOSIS — R0600 Dyspnea, unspecified: Secondary | ICD-10-CM | POA: Diagnosis not present

## 2020-09-01 DIAGNOSIS — R0609 Other forms of dyspnea: Secondary | ICD-10-CM

## 2020-09-01 DIAGNOSIS — J432 Centrilobular emphysema: Secondary | ICD-10-CM

## 2020-09-01 DIAGNOSIS — I2510 Atherosclerotic heart disease of native coronary artery without angina pectoris: Secondary | ICD-10-CM

## 2020-09-01 DIAGNOSIS — Z87891 Personal history of nicotine dependence: Secondary | ICD-10-CM | POA: Diagnosis not present

## 2020-09-01 MED ORDER — BUPROPION HCL ER (XL) 150 MG PO TB24
150.0000 mg | ORAL_TABLET | Freq: Every morning | ORAL | 0 refills | Status: DC
  Filled 2020-09-01: qty 90, 90d supply, fill #0

## 2020-09-01 NOTE — Patient Instructions (Addendum)
-   We will schedule breathing tests for you within this week. I will call with results to discuss the inhaler I will prescribe on that basis.  - See you in 4 months!

## 2020-09-05 ENCOUNTER — Encounter (INDEPENDENT_AMBULATORY_CARE_PROVIDER_SITE_OTHER): Payer: Self-pay

## 2020-09-05 ENCOUNTER — Other Ambulatory Visit: Payer: Self-pay

## 2020-09-05 ENCOUNTER — Ambulatory Visit: Payer: Medicare Other

## 2020-09-05 DIAGNOSIS — R0600 Dyspnea, unspecified: Secondary | ICD-10-CM

## 2020-09-05 DIAGNOSIS — R0609 Other forms of dyspnea: Secondary | ICD-10-CM

## 2020-09-05 LAB — PULMONARY FUNCTION TEST
DL/VA % pred: 91 %
DL/VA: 3.72 ml/min/mmHg/L
DLCO cor % pred: 77 %
DLCO cor: 16.79 ml/min/mmHg
DLCO unc % pred: 74 %
DLCO unc: 16.19 ml/min/mmHg
FEF 25-75 Post: 1.67 L/s
FEF 25-75 Pre: 1.63 L/s
FEF2575-%Change-Post: 2 %
FEF2575-%Pred-Post: 76 %
FEF2575-%Pred-Pre: 74 %
FEV1-%Change-Post: 0 %
FEV1-%Pred-Post: 81 %
FEV1-%Pred-Pre: 80 %
FEV1-Post: 2.14 L
FEV1-Pre: 2.12 L
FEV1FVC-%Change-Post: 3 %
FEV1FVC-%Pred-Pre: 98 %
FEV6-%Change-Post: -1 %
FEV6-%Pred-Post: 82 %
FEV6-%Pred-Pre: 84 %
FEV6-Post: 2.74 L
FEV6-Pre: 2.79 L
FEV6FVC-%Change-Post: 0 %
FEV6FVC-%Pred-Post: 104 %
FEV6FVC-%Pred-Pre: 103 %
FVC-%Change-Post: -2 %
FVC-%Pred-Post: 79 %
FVC-%Pred-Pre: 82 %
FVC-Post: 2.74 L
FVC-Pre: 2.82 L
Post FEV1/FVC ratio: 78 %
Post FEV6/FVC ratio: 100 %
Pre FEV1/FVC ratio: 75 %
Pre FEV6/FVC Ratio: 100 %

## 2020-09-06 ENCOUNTER — Encounter (INDEPENDENT_AMBULATORY_CARE_PROVIDER_SITE_OTHER): Payer: Self-pay

## 2020-09-13 ENCOUNTER — Ambulatory Visit (INDEPENDENT_AMBULATORY_CARE_PROVIDER_SITE_OTHER): Payer: Medicare Other | Admitting: Family Medicine

## 2020-09-15 ENCOUNTER — Telehealth: Payer: Self-pay | Admitting: *Deleted

## 2020-09-15 NOTE — Telephone Encounter (Signed)
[facility] GIVEN 04/14/2020 NEXT INJECTION 10/15/2020

## 2020-09-15 NOTE — Telephone Encounter (Addendum)
Deductible N/A  OOP MAX $4200  Annual exam 06/17/2020  Calcium 10.1            Date 08/09/2020  Upcoming dental procedures no  Prior Authorization needed NO prep reference # [id number]  Pt estimated Cost $213  Appt 10/14/2020    Coverage Details: 20% of one dose

## 2020-09-20 ENCOUNTER — Ambulatory Visit (INDEPENDENT_AMBULATORY_CARE_PROVIDER_SITE_OTHER): Payer: Medicare Other | Admitting: Adult Health

## 2020-09-20 ENCOUNTER — Other Ambulatory Visit: Payer: Self-pay

## 2020-09-20 ENCOUNTER — Encounter (INDEPENDENT_AMBULATORY_CARE_PROVIDER_SITE_OTHER): Payer: Self-pay | Admitting: Physician Assistant

## 2020-09-20 VITALS — BP 128/80 | HR 61 | Temp 98.2°F | Ht 67.0 in | Wt 188.0 lb

## 2020-09-20 DIAGNOSIS — R7303 Prediabetes: Secondary | ICD-10-CM | POA: Diagnosis not present

## 2020-09-20 DIAGNOSIS — E7849 Other hyperlipidemia: Secondary | ICD-10-CM

## 2020-09-20 DIAGNOSIS — E669 Obesity, unspecified: Secondary | ICD-10-CM

## 2020-09-20 DIAGNOSIS — Z6830 Body mass index (BMI) 30.0-30.9, adult: Secondary | ICD-10-CM | POA: Diagnosis not present

## 2020-09-20 NOTE — Progress Notes (Signed)
Chief Complaint:   OBESITY Sharon Daniels is here to discuss her progress with her obesity treatment plan along with follow-up of her obesity related diagnoses. Sharon Daniels is on keeping a food journal and adhering to recommended goals of 1200 calories and 95 grams of protein and states she is following her eating plan approximately 80% of the time. Sharon Daniels states she is doing a cardio rehab program for 15 minutes 3 times per week.  Today's visit was #: 3 Starting weight: 192 lbs Starting date: 08/09/2020 Today's weight: 188 lbs Today's date: 09/20/2020 Total lbs lost to date: 4 lbs Total lbs lost since last in-office visit: 1 lb  Interim History: Sharon Daniels is meeting 80% of her calorie/protein goals with 100% tracking intake.   Ozempic and Mounjaro - both denied by insurance.  Subjective:   1. Hyperlipidemia Cardiology managing- Per Cards on 07/08/20- Her blood pressure is also uncontrolled and I will like to getting her LDL down to <70 and closer to 55 mg hence we will increase Crestor from 5 mg to 20 mg daily and also add metoprolol succinate 50 mg daily both for hypertension and CAD. On 08/09/2020, lipid panel- LDL at high end of goal per Cards  Lab Results  Component Value Date   ALT 24 08/09/2020   AST 26 08/09/2020   ALKPHOS 69 08/09/2020   BILITOT 0.3 08/09/2020   Lab Results  Component Value Date   CHOL 146 08/09/2020   HDL 61 08/09/2020   LDLCALC 70 08/09/2020   TRIG 79 08/09/2020   CHOLHDL 2.4 08/09/2020   2. Prediabetes Denies 1st degree family history of T2D. On 08/09/2020, A1c was 6.0. Ozempic and Mounjaro - both denied by insurance.  Lab Results  Component Value Date   HGBA1C 6.0 (H) 08/09/2020   Lab Results  Component Value Date   INSULIN 12.6 08/09/2020   Assessment/Plan:   1. Hyperlipidemia Continue Crestor per Cardiology.  2. Prediabetes Increase protein, increase daily walking.  3. Obesity with current BMI of 29.5  Sharon Daniels is currently in  the action stage of change. As such, her goal is to continue with weight loss efforts. She has agreed to keeping a food journal and adhering to recommended goals of 1200 calories and 95 grams of protein.   Exercise goals:  As is.  Behavioral modification strategies: increasing lean protein intake, decreasing simple carbohydrates, meal planning and cooking strategies, keeping healthy foods in the home, planning for success, and keeping a strict food journal.  Handouts:  Eating Out Guide.  Recipe II Guide.  Keep Diet Dr. Malachi Bonds secondary.    Increase water intake.  Sharon Daniels has agreed to follow-up with our clinic in 2 weeks. She was informed of the importance of frequent follow-up visits to maximize her success with intensive lifestyle modifications for her multiple health conditions.   Objective:   Blood pressure 128/80, pulse 61, temperature 98.2 F (36.8 C), height '5\' 7"'$  (1.702 m), weight 188 lb (85.3 kg), SpO2 98 %. Body mass index is 29.44 kg/m.  General: Cooperative, alert, well developed, in no acute distress. HEENT: Conjunctivae and lids unremarkable. Cardiovascular: Regular rhythm.  Lungs: Normal work of breathing. Neurologic: No focal deficits.   Lab Results  Component Value Date   CREATININE 0.83 08/09/2020   BUN 15 08/09/2020   NA 141 08/09/2020   K 4.9 08/09/2020   CL 102 08/09/2020   CO2 25 08/09/2020   Lab Results  Component Value Date   ALT 24 08/09/2020   AST  26 08/09/2020   ALKPHOS 69 08/09/2020   BILITOT 0.3 08/09/2020   Lab Results  Component Value Date   HGBA1C 6.0 (H) 08/09/2020   Lab Results  Component Value Date   INSULIN 12.6 08/09/2020   Lab Results  Component Value Date   TSH 2.300 08/09/2020   Lab Results  Component Value Date   CHOL 146 08/09/2020   HDL 61 08/09/2020   LDLCALC 70 08/09/2020   TRIG 79 08/09/2020   CHOLHDL 2.4 08/09/2020   Lab Results  Component Value Date   VD25OH 59.9 08/09/2020   VD25OH 54 04/26/2016    VD25OH 32 04/29/2015   Lab Results  Component Value Date   WBC 6.5 08/09/2020   HGB 12.3 08/09/2020   HCT 37.8 08/09/2020   MCV 100 (H) 08/09/2020   PLT 242 08/09/2020   Lab Results  Component Value Date   IRON 84 06/21/2005   TIBC 353 06/21/2005   FERRITIN 55 06/21/2005   Attestation Statements:   Reviewed by clinician on day of visit: allergies, medications, problem list, medical history, surgical history, family history, social history, and previous encounter notes.  Time spent on visit including pre-visit chart review and post-visit care and charting was 30 minutes.   I, Water quality scientist, CMA, am acting as Location manager for Mina Marble, NP.  I have reviewed the above documentation for accuracy and completeness, and I agree with the above. -  Quynh Basso d. Aaralyn Kil, NP-C

## 2020-09-27 MED FILL — Valsartan Tab 160 MG: ORAL | 90 days supply | Qty: 135 | Fill #1 | Status: AC

## 2020-09-28 ENCOUNTER — Other Ambulatory Visit (HOSPITAL_COMMUNITY): Payer: Self-pay

## 2020-09-28 ENCOUNTER — Telehealth: Payer: Self-pay | Admitting: Student

## 2020-09-28 NOTE — Telephone Encounter (Signed)
Pt is calling to get the results of her PFT.  NM please advise. Thanks

## 2020-09-29 ENCOUNTER — Other Ambulatory Visit (HOSPITAL_COMMUNITY): Payer: Self-pay

## 2020-09-29 MED ORDER — ANORO ELLIPTA 62.5-25 MCG/INH IN AEPB
1.0000 | INHALATION_SPRAY | Freq: Every day | RESPIRATORY_TRACT | 11 refills | Status: DC
  Filled 2020-09-29: qty 60, 30d supply, fill #0

## 2020-09-29 NOTE — Telephone Encounter (Signed)
PFT shows borderline restrictive lung disease. 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 I think it's worth trying anoro 1 puff once daily which I've sent to the [facility]. I attempted to call x2 and left voicemail, I did send mychart message to this effect today.  [place]

## 2020-10-06 ENCOUNTER — Encounter: Payer: Self-pay | Admitting: Gastroenterology

## 2020-10-07 ENCOUNTER — Ambulatory Visit (INDEPENDENT_AMBULATORY_CARE_PROVIDER_SITE_OTHER): Payer: Medicare Other | Admitting: Family Medicine

## 2020-10-07 ENCOUNTER — Encounter (INDEPENDENT_AMBULATORY_CARE_PROVIDER_SITE_OTHER): Payer: Self-pay

## 2020-10-14 ENCOUNTER — Other Ambulatory Visit (HOSPITAL_COMMUNITY): Payer: Self-pay

## 2020-10-14 ENCOUNTER — Ambulatory Visit: Payer: Medicare Other

## 2020-10-18 ENCOUNTER — Other Ambulatory Visit (HOSPITAL_COMMUNITY): Payer: Self-pay

## 2020-10-18 ENCOUNTER — Other Ambulatory Visit: Payer: Self-pay

## 2020-10-18 ENCOUNTER — Ambulatory Visit (INDEPENDENT_AMBULATORY_CARE_PROVIDER_SITE_OTHER): Payer: Medicare Other | Admitting: Family Medicine

## 2020-10-18 ENCOUNTER — Encounter (INDEPENDENT_AMBULATORY_CARE_PROVIDER_SITE_OTHER): Payer: Self-pay | Admitting: Family Medicine

## 2020-10-18 VITALS — BP 119/74 | HR 71 | Temp 98.1°F | Ht 67.0 in | Wt 189.0 lb

## 2020-10-18 DIAGNOSIS — E669 Obesity, unspecified: Secondary | ICD-10-CM

## 2020-10-18 DIAGNOSIS — I10 Essential (primary) hypertension: Secondary | ICD-10-CM | POA: Diagnosis not present

## 2020-10-18 DIAGNOSIS — F39 Unspecified mood [affective] disorder: Secondary | ICD-10-CM

## 2020-10-18 DIAGNOSIS — R7303 Prediabetes: Secondary | ICD-10-CM | POA: Diagnosis not present

## 2020-10-18 DIAGNOSIS — Z6830 Body mass index (BMI) 30.0-30.9, adult: Secondary | ICD-10-CM

## 2020-10-18 MED ORDER — METFORMIN HCL 500 MG PO TABS
500.0000 mg | ORAL_TABLET | Freq: Every day | ORAL | 0 refills | Status: DC
  Filled 2020-10-18: qty 30, 30d supply, fill #0

## 2020-10-20 NOTE — Progress Notes (Signed)
Chief Complaint:   OBESITY Sharon Daniels is here to discuss her progress with her obesity treatment plan along with follow-up of her obesity related diagnoses.   Today's visit was #: 4 Starting weight: 192 lbs Starting date: 08/09/2020 Today's weight: 189 lbs Today's date: 10/18/2020 Weight change since last visit: +1 Total lbs lost to date: 3 lbs Body mass index is 29.6 kg/m.  Total weight loss percentage to date: -1.56%  Current Meal Plan: keeping a food journal and adhering to recommended goals of 1200 calories and 95 protein for 90% of the time.  Current Exercise Plan: Cardiac Rehab for 25 minutes 3 times per week.  Interim History:  Sharon Daniels is frustrated with the lack of scale movement.  She is logging food and averaging 1100-1300 calories and 100-120 grams of protein.  Assessment/Plan:   1. Prediabetes Not at goal. Goal is HgbA1c < 5.7.  Medication: None.    Plan:  Start metformin 500 mg daily, as per below. She will continue to focus on protein-rich, low simple carbohydrate foods. We reviewed the importance of hydration, regular exercise for stress reduction, and restorative sleep.   Lab Results  Component Value Date   HGBA1C 6.0 (H) 08/09/2020   Lab Results  Component Value Date   INSULIN 12.6 08/09/2020   - Start metFORMIN (GLUCOPHAGE) 500 MG tablet; Take 1 tablet (500 mg total) by mouth daily with breakfast.  Dispense: 30 tablet; Refill: 0  2. Essential hypertension At goal. Medications: Toprol-XL 100 mg daily, valsartan 160 mg daily.   Plan: Avoid buying foods that are: processed, frozen, or prepackaged to avoid excess salt. We will watch for signs of hypotension as she continues lifestyle modifications.  BP Readings from Last 3 Encounters:  10/18/20 119/74  09/20/20 128/80  09/01/20 124/72   Lab Results  Component Value Date   CREATININE 0.83 08/09/2020   3. Mood disorder (emotional eating) Medication: Wellbutrin XL 150 mg daily, Zoloft 150 mg  daily.  Plan:  Motivational interviewing as well as evidence-based interventions for health behavior change were utilized today including the discussion of self monitoring techniques, problem-solving barriers and SMART goal setting techniques.    4. Obesity, current BMI 29.6  Course: Sharon Daniels is currently in the action stage of change. As such, her goal is to continue with weight loss efforts.   Nutrition goals: She has agreed to keeping a food journal and adhering to recommended goals of 1200 calories and 95 grams of protein.   Exercise goals:  As is.  Behavioral modification strategies: increasing lean protein intake, decreasing simple carbohydrates, increasing vegetables, and increasing water intake.  Sharon Daniels has agreed to follow-up with our clinic in 4 weeks. She was informed of the importance of frequent follow-up visits to maximize her success with intensive lifestyle modifications for her multiple health conditions.   Objective:   Blood pressure 119/74, pulse 71, temperature 98.1 F (36.7 C), temperature source Oral, height 5\' 7"  (1.702 m), weight 189 lb (85.7 kg), SpO2 98 %. Body mass index is 29.6 kg/m.  General: Cooperative, alert, well developed, in no acute distress. HEENT: Conjunctivae and lids unremarkable. Cardiovascular: Regular rhythm.  Lungs: Normal work of breathing. Neurologic: No focal deficits.   Lab Results  Component Value Date   CREATININE 0.83 08/09/2020   BUN 15 08/09/2020   NA 141 08/09/2020   K 4.9 08/09/2020   CL 102 08/09/2020   CO2 25 08/09/2020   Lab Results  Component Value Date   ALT 24 08/09/2020  AST 26 08/09/2020   ALKPHOS 69 08/09/2020   BILITOT 0.3 08/09/2020   Lab Results  Component Value Date   HGBA1C 6.0 (H) 08/09/2020   Lab Results  Component Value Date   INSULIN 12.6 08/09/2020   Lab Results  Component Value Date   TSH 2.300 08/09/2020   Lab Results  Component Value Date   CHOL 146 08/09/2020   HDL 61  08/09/2020   LDLCALC 70 08/09/2020   TRIG 79 08/09/2020   CHOLHDL 2.4 08/09/2020   Lab Results  Component Value Date   VD25OH 59.9 08/09/2020   VD25OH 54 04/26/2016   VD25OH 32 04/29/2015   Lab Results  Component Value Date   WBC 6.5 08/09/2020   HGB 12.3 08/09/2020   HCT 37.8 08/09/2020   MCV 100 (H) 08/09/2020   PLT 242 08/09/2020   Lab Results  Component Value Date   IRON 84 06/21/2005   TIBC 353 06/21/2005   FERRITIN 55 06/21/2005   Attestation Statements:   Reviewed by clinician on day of visit: allergies, medications, problem list, medical history, surgical history, family history, social history, and previous encounter notes.  Time spent on visit including pre-visit chart review and post-visit care and charting was 50 minutes.   I, Water quality scientist, CMA, am acting as transcriptionist for Briscoe Deutscher, DO  I have reviewed the above documentation for accuracy and completeness, and I agree with the above. -  Briscoe Deutscher, DO, MS, FAAFP, DABOM - Family and Bariatric Medicine.

## 2020-10-24 ENCOUNTER — Other Ambulatory Visit (HOSPITAL_COMMUNITY): Payer: Self-pay

## 2020-10-24 MED ORDER — SERTRALINE HCL 100 MG PO TABS
150.0000 mg | ORAL_TABLET | Freq: Every day | ORAL | 1 refills | Status: DC
  Filled 2020-10-24: qty 135, 90d supply, fill #0

## 2020-10-24 MED ORDER — BUPROPION HCL ER (XL) 300 MG PO TB24
300.0000 mg | ORAL_TABLET | Freq: Every morning | ORAL | 1 refills | Status: DC
  Filled 2020-10-24 – 2020-11-16 (×2): qty 90, 90d supply, fill #0
  Filled 2021-02-17: qty 90, 90d supply, fill #1

## 2020-10-24 MED ORDER — ATOMOXETINE HCL 40 MG PO CAPS
40.0000 mg | ORAL_CAPSULE | Freq: Every morning | ORAL | 1 refills | Status: DC
  Filled 2020-10-24: qty 90, 90d supply, fill #0
  Filled 2021-02-17: qty 90, 90d supply, fill #1

## 2020-11-03 ENCOUNTER — Other Ambulatory Visit (HOSPITAL_COMMUNITY): Payer: Self-pay

## 2020-11-03 MED ORDER — CEFDINIR 300 MG PO CAPS
300.0000 mg | ORAL_CAPSULE | Freq: Two times a day (BID) | ORAL | 0 refills | Status: DC
  Filled 2020-11-03: qty 10, 5d supply, fill #0

## 2020-11-14 ENCOUNTER — Encounter (INDEPENDENT_AMBULATORY_CARE_PROVIDER_SITE_OTHER): Payer: Self-pay | Admitting: Family Medicine

## 2020-11-14 ENCOUNTER — Ambulatory Visit (INDEPENDENT_AMBULATORY_CARE_PROVIDER_SITE_OTHER): Payer: Medicare Other | Admitting: Family Medicine

## 2020-11-14 ENCOUNTER — Other Ambulatory Visit: Payer: Self-pay

## 2020-11-14 VITALS — BP 116/67 | HR 71 | Temp 98.0°F | Ht 67.0 in | Wt 187.0 lb

## 2020-11-14 DIAGNOSIS — I10 Essential (primary) hypertension: Secondary | ICD-10-CM

## 2020-11-14 DIAGNOSIS — R7303 Prediabetes: Secondary | ICD-10-CM | POA: Diagnosis not present

## 2020-11-14 DIAGNOSIS — Z6830 Body mass index (BMI) 30.0-30.9, adult: Secondary | ICD-10-CM

## 2020-11-14 DIAGNOSIS — E669 Obesity, unspecified: Secondary | ICD-10-CM | POA: Diagnosis not present

## 2020-11-14 NOTE — Progress Notes (Signed)
Chief Complaint:   OBESITY Sharon Daniels is here to discuss her progress with her obesity treatment plan along with follow-up of her obesity related diagnoses. See Medical Weight Management Flowsheet for complete bioelectrical impedance results.  Today's visit was #: 5 Starting weight: 192 lbs Starting date: 08/09/2020 Weight change since last visit: 2 lbs Total lbs lost to date: 5 lbs Total weight loss percentage to date: -2.60%  Nutrition Plan: Keeping a food journal and adhering to recommended goals of 1200 calories and 95 grams of protein daily for 85% of the time. Activity: Cardiac rehab for 30 minutes 3 times per week.   Interim History: Sharon Daniels feels bloated, endorses constipation.  Generally getting 95 grams of protein each day, low carb, a lot of cheese, keto bread, meat.  Assessment/Plan:   1. Prediabetes Not at goal. Goal is HgbA1c < 5.7.  Medication: metformin 500 mg daily.    Plan:  Increase metformin to 500 mg twice daily.  She will continue to focus on protein-rich, low simple carbohydrate foods. We reviewed the importance of hydration, regular exercise for stress reduction, and restorative sleep.   Lab Results  Component Value Date   HGBA1C 6.0 (H) 08/09/2020   Lab Results  Component Value Date   INSULIN 12.6 08/09/2020   2. Essential hypertension At goal. Medications: metoprolol 100 mg daily and valsartan 240 mg daily.   Plan:  Continue current medications.  Avoid buying foods that are: processed, frozen, or prepackaged to avoid excess salt. We will watch for signs of hypotension as she continues lifestyle modifications.  BP Readings from Last 3 Encounters:  11/14/20 116/67  10/18/20 119/74  09/20/20 128/80   Lab Results  Component Value Date   CREATININE 0.83 08/09/2020   3. Obesity, current BMI 29.4  Course: Sharon Daniels is currently in the action stage of change. As such, her goal is to continue with weight loss efforts.   Nutrition goals: She  has agreed to keeping a food journal and adhering to recommended goals of 1200 calories and 95 grams of protein.   Exercise goals:  As is.  Behavioral modification strategies: increasing water intake and increasing high fiber foods.  Sharon Daniels has agreed to follow-up with our clinic in 4 weeks. She was informed of the importance of frequent follow-up visits to maximize her success with intensive lifestyle modifications for her multiple health conditions.   Objective:   Blood pressure 116/67, pulse 71, temperature 98 F (36.7 C), temperature source Oral, height 5\' 7"  (1.702 m), weight 187 lb (84.8 kg), SpO2 98 %. Body mass index is 29.29 kg/m.  General: Cooperative, alert, well developed, in no acute distress. HEENT: Conjunctivae and lids unremarkable. Cardiovascular: Regular rhythm.  Lungs: Normal work of breathing. Neurologic: No focal deficits.   Lab Results  Component Value Date   CREATININE 0.83 08/09/2020   BUN 15 08/09/2020   NA 141 08/09/2020   K 4.9 08/09/2020   CL 102 08/09/2020   CO2 25 08/09/2020   Lab Results  Component Value Date   ALT 24 08/09/2020   AST 26 08/09/2020   ALKPHOS 69 08/09/2020   BILITOT 0.3 08/09/2020   Lab Results  Component Value Date   HGBA1C 6.0 (H) 08/09/2020   Lab Results  Component Value Date   INSULIN 12.6 08/09/2020   Lab Results  Component Value Date   TSH 2.300 08/09/2020   Lab Results  Component Value Date   CHOL 146 08/09/2020   HDL 61 08/09/2020   [place]  70 08/09/2020   TRIG 79 08/09/2020   CHOLHDL 2.4 08/09/2020   Lab Results  Component Value Date   VD25OH 59.9 08/09/2020   VD25OH 54 04/26/2016   VD25OH 32 04/29/2015   Lab Results  Component Value Date   WBC 6.5 08/09/2020   HGB 12.3 08/09/2020   HCT 37.8 08/09/2020   MCV 100 (H) 08/09/2020   PLT 242 08/09/2020   Lab Results  Component Value Date   IRON 84 06/21/2005   TIBC 353 06/21/2005   FERRITIN 55 06/21/2005   Attestation Statements:    Reviewed by clinician on day of visit: allergies, medications, problem list, medical history, surgical history, family history, social history, and previous encounter notes.  I, Water quality scientist, CMA, am acting as transcriptionist for Briscoe Deutscher, DO  I have reviewed the above documentation for accuracy and completeness, and I agree with the above. -  Briscoe Deutscher, DO, MS, FAAFP, DABOM - Family and Bariatric Medicine.

## 2020-11-16 ENCOUNTER — Ambulatory Visit: Payer: Medicare Other | Admitting: Cardiology

## 2020-11-16 ENCOUNTER — Other Ambulatory Visit: Payer: Self-pay

## 2020-11-16 ENCOUNTER — Encounter (INDEPENDENT_AMBULATORY_CARE_PROVIDER_SITE_OTHER): Payer: Self-pay

## 2020-11-16 ENCOUNTER — Encounter: Payer: Self-pay | Admitting: Cardiology

## 2020-11-16 ENCOUNTER — Other Ambulatory Visit (HOSPITAL_COMMUNITY): Payer: Self-pay

## 2020-11-16 VITALS — BP 133/71 | HR 63 | Temp 98.0°F | Resp 17 | Ht 67.0 in | Wt 193.0 lb

## 2020-11-16 DIAGNOSIS — R931 Abnormal findings on diagnostic imaging of heart and coronary circulation: Secondary | ICD-10-CM

## 2020-11-16 DIAGNOSIS — R0609 Other forms of dyspnea: Secondary | ICD-10-CM

## 2020-11-16 DIAGNOSIS — E7800 Pure hypercholesterolemia, unspecified: Secondary | ICD-10-CM

## 2020-11-16 DIAGNOSIS — I10 Essential (primary) hypertension: Secondary | ICD-10-CM

## 2020-11-16 MED ORDER — METFORMIN HCL 500 MG PO TABS
500.0000 mg | ORAL_TABLET | Freq: Two times a day (BID) | ORAL | 1 refills | Status: DC
  Filled 2020-11-16 (×2): qty 60, 30d supply, fill #0

## 2020-11-16 MED FILL — Montelukast Sodium Tab 10 MG (Base Equiv): ORAL | 90 days supply | Qty: 90 | Fill #1 | Status: AC

## 2020-11-16 NOTE — Progress Notes (Signed)
Primary Physician/Referring:  [name], MD  Patient ID: Sharon Daniels, female    DOB: August 09, 1950, 70 y.o.   MRN: [id number]  Chief Complaint  Patient presents with  . Coronary Artery Disease  . Chest Pain  . Hypertension  . Hyperlipidemia  . Follow-up    3 month   HPI:    Sharon Daniels  is a 70 y.o. 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  She presents here for follow-up of markedly elevated coronary calcium score and dyspnea on exertion and hypertension.  Patient now presents for follow-up.  She is presently still having dyspnea on exertion but states that this is chronic and unchanged.  She has not had any exertional chest pain.  She has joined wellness clinic and also has been exercising at home without any chest discomfort.  She was also seen by pulmonary medicine and is being treated for emphysema.  She denies any leg edema, no dizziness or syncope.  She is tolerating all medications well including increased dose of metoprolol succinate and Crestor without any side effects. Past Medical History:  Diagnosis Date  . Acute pulmonary embolism ([place]) 06/13/2013   CT angio chest 06/12/13  . ADHD   . Alcohol abuse   . Allergy   . Anemia   . Anxiety   . Arthritis   . Back pain   . Complication of anesthesia    "during breast surgery: could hear the surgery, wake up too soon, anxious  . Constipation   . COPD (chronic obstructive pulmonary disease) ([place])   . Depression    hx of  . DVT (deep venous thrombosis) ([place]) 2015  . Fatty liver   . Frequent sinus infections   . GERD (gastroesophageal reflux disease)   . H/O bronchitis   . Hard of hearing   . High cholesterol   . History of kidney stones    x3  . Hypertension   . Joint pain   . Osteoporosis   . Pneumonia     hx of 10 years ago  . PONV (postoperative nausea and vomiting)    "mild nausea"  . Post-menopausal   . Prediabetes   . Stress incontinence   . Substance abuse ([place])   . Vitamin D deficiency   . Wears glasses    Past Surgical History:  Procedure Laterality Date  . ABDOMINOPLASTY  09/2015  . BREAST REDUCTION SURGERY  08/04/2015  . BREAST REDUCTION SURGERY Bilateral 08/04/2015   Procedure: MAMMARY REDUCTION  (BREAST);  Surgeon: Crissie Reese, MD;  Location: [facility];  Service: Plastics;  Laterality: Bilateral;  . BREAST SURGERY  1985   abscess  . CARPAL TUNNEL RELEASE Left   . [place]  . COLONOSCOPY    . COSMETIC SURGERY  2013   in the office  . COSMETIC SURGERY     ear lobes shorten  . CYSTECTOMY    . HAMMERTOE RECONSTRUCTION WITH WEIL OSTEOTOMY Right 06/26/2018   Procedure: Right 2nd toe hammertoe correction; 2nd metatarsal Weil osteotomy;  Surgeon: Wylene Simmer, MD;  Location: [facility];  Service: Orthopedics;  Laterality: Right;  2min  . NASAL SINUS SURGERY  2000  . ORIF ANKLE FRACTURE Left 10/28/2018   Procedure: OPEN REDUCTION INTERNAL FIXATION (ORIF) LEFT ANKLE BIMALLEOLAR FRACTURE DISLOCATION;  Surgeon: Wylene Simmer, MD;  Location: [facility];  Service: Orthopedics;  Laterality: Left;  80min  . TOTAL KNEE ARTHROPLASTY Bilateral 06/05/2013   Procedure: BILATERAL TOTAL KNEE ARTHROPLASTY;  Surgeon: Gearlean Alf, MD;  Location: [facility];  Service: Orthopedics;  Laterality: Bilateral;   Family History  Problem Relation Age of Onset  . Cancer Mother   . Hyperlipidemia Mother   . Hypertension Mother   . Kidney disease Mother   . Atrial fibrillation Mother   . Breast cancer Mother   . Ovarian cancer Mother   . Heart disease Mother   . Depression Mother   . Alcohol abuse Mother   . Heart disease Father   . Hyperlipidemia Father   . Hypertension Father   . Heart attack Father   . Colon cancer Neg Hx   . Esophageal cancer Neg Hx   . Stomach  cancer Neg Hx   . Rectal cancer Neg Hx     Social History   Tobacco Use  . Smoking status: Former    Packs/day: 1.00    Years: 30.00    Pack years: 30.00    Types: Cigarettes    Quit date: 10/19/2017    Years since quitting: 3.0  . Smokeless tobacco: Never  . Tobacco comments:    Stop Smoking Oct..2019  Substance Use Topics  . Alcohol use: Not Currently   Marital Status: Divorced  ROS  Review of Systems  Cardiovascular:  Positive for dyspnea on exertion. Negative for chest pain and leg swelling.  Gastrointestinal:  Negative for melena.  Objective  Blood pressure 133/71, pulse 63, temperature 98 F (36.7 C), resp. rate 17, height 5\' 7"  (1.702 m), weight 193 lb (87.5 kg), SpO2 97 %. Body mass index is 30.23 kg/m.  Vitals with BMI 11/16/2020 11/14/2020 10/18/2020  Height 5\' 7"  5\' 7"  5\' 7"   Weight 193 lbs 187 lbs 189 lbs  BMI 30.22 53.29 92.42  Systolic 683 419 622  Diastolic 71 67 74  Pulse 63 71 71     Physical Exam Vitals reviewed.  Neck:     Vascular: No carotid bruit or JVD.  Cardiovascular:     Rate and Rhythm: Normal rate and regular rhythm.     Pulses: Intact distal pulses.     Heart sounds: Normal heart sounds, S1 normal and S2 normal. No murmur heard.   No gallop.  Pulmonary:     Effort: Pulmonary effort is normal.     Breath sounds: Examination of the right-lower field reveals rhonchi. Examination of the left-lower field reveals rhonchi. Rhonchi present.  Musculoskeletal:     Right lower leg: No edema.     Left lower leg: No edema.     Laboratory examination:   Recent Labs    03/24/20 1339 08/09/20 0942  NA  --  141  K  --  4.9  CL  --  102  CO2  --  25  GLUCOSE  --  109*  BUN  --  15  CREATININE  --  0.83  CALCIUM 9.8 10.1   CrCl cannot be calculated (Patient's most recent lab result is older than the maximum 21 days allowed.).  CMP Latest Ref Rng & Units 08/09/2020 03/24/2020 03/24/2019  Glucose 65 - 99 mg/dL 109(H) - -  BUN 8 - 27 mg/dL 15 -  -  Creatinine 0.57 - 1.00 mg/dL 0.83 - -  Sodium 134 - 144 mmol/L 141 - -  Potassium 3.5 - 5.2 mmol/L 4.9 - -  Chloride 96 - 106  mmol/L 102 - -  CO2 20 - 29 mmol/L 25 - -  Calcium 8.7 - 10.3 mg/dL 10.1 9.8 10.4  Total Protein 6.0 - 8.5 g/dL 7.2 - -  Total Bilirubin 0.0 - 1.2 mg/dL 0.3 - -  Alkaline Phos 44 - 121 IU/L 69 - -  AST 0 - 40 IU/L 26 - -  ALT 0 - 32 IU/L 24 - -   CBC Latest Ref Rng & Units 08/09/2020 10/20/2018 07/27/2015  WBC 3.4 - 10.8 x10E3/uL 6.5 11.2(H) 7.6  Hemoglobin 11.1 - 15.9 g/dL 12.3 10.9(L) 12.1  Hematocrit 34.0 - 46.6 % 37.8 33.9(L) 38.1  Platelets 150 - 450 x10E3/uL 242 235 289   Lipid Panel Recent Labs    08/09/20 0942  CHOL 146  TRIG 79  LDLCALC 70  HDL 61  CHOLHDL 2.4   Lipid Panel     Component Value Date/Time   CHOL 146 08/09/2020 0942   TRIG 79 08/09/2020 0942   HDL 61 08/09/2020 0942   CHOLHDL 2.4 08/09/2020 0942   LDLCALC 70 08/09/2020 0942   LABVLDL 15 08/09/2020 0942     HEMOGLOBIN A1C Lab Results  Component Value Date   HGBA1C 6.0 (H) 08/09/2020   TSH Recent Labs    08/09/20 0942  TSH 2.300    External labs:   Cholesterol, total 151.000 M 07/16/2019 HDL 53.000 MG 07/16/2019 LDL 85.000 MG 07/16/2019 Triglycerides 67.000 MG 07/16/2019  Hemoglobin 10.900 g/d 10/20/2018  Creatinine, Serum 0.930 MG/ 07/16/2019 Potassium 4.400 mm 10/20/2018 ALT (SGPT) 30.000 IU/ 07/16/2019  TSH 1.360 10/28/2019 Medications and allergies   Allergies  Allergen Reactions  . Morphine Itching, Anxiety and Rash    IV only, made her mean     Medication prior to this encounter:   Outpatient Medications Prior to Visit  Medication Sig Dispense Refill  . Ascorbic Acid (VITAMIN C) 500 MG CAPS Take 1 capsule by mouth daily.    Marland Kitchen aspirin 81 MG EC tablet Take 81 mg by mouth daily. Swallow whole.    Marland Kitchen atomoxetine (STRATTERA) 40 MG capsule Take 1 capsule (40 mg total) by mouth in the morning. 90 capsule 1  . azelastine (ASTELIN) 0.1 % nasal spray PLACE 2  SPRAYS INTO EACH NOSTRIL 2 TIMES PER DAY 30 mL 11  . buPROPion (WELLBUTRIN XL) 300 MG 24 hr tablet Take 1 tablet (300 mg total) by mouth in the morning. 90 tablet 1  . Cholecalciferol (VITAMIN D3) 1000 UNITS CAPS Take 1,000 Units by mouth daily.     Marland Kitchen denosumab (PROLIA) 60 MG/ML SOLN injection Inject 60 mg into the skin once. Administer in upper arm, thigh, or abdomen (Patient taking differently: Inject 60 mg into the skin every 6 (six) months. Administer in upper arm, thigh, or abdomen) 1 Syringe 0  . loratadine (CLARITIN) 10 MG tablet Take 10 mg by mouth daily.    . metFORMIN (GLUCOPHAGE) 500 MG tablet Take 1 tablet (500 mg total) by mouth 2 (two) times daily. 60 tablet 1  . metoprolol succinate (TOPROL-XL) 50 MG 24 hr tablet Take 2 tablets (100 mg total) by mouth daily. Take with or immediately following a meal. 180 tablet 2  . montelukast (SINGULAIR) 10 MG tablet TAKE 1 TABLET BY MOUTH DAILY IN THE EVENING 90 tablet 3  . Multiple Vitamin (MULTIVITAMIN WITH MINERALS) TABS tablet Take 1 tablet by mouth daily.    . nitroGLYCERIN (NITROSTAT) 0.4 MG SL tablet Take 1 tablet sublingually if needed for chest pain, repeat in 5 minutes  if needed and call EMS 25 tablet 0  . omeprazole (PRILOSEC) 40 MG capsule Take 40 mg by mouth daily as needed (for acid reflux or heartburn). Reported on 04/21/2015    . Prasterone (INTRAROSA) 6.5 MG INST Place 6.5 mg vaginally at bedtime. 28 each 11  . rosuvastatin (CRESTOR) 20 MG tablet Take 1 tablet (20 mg total) by mouth daily. 90 tablet 3  . sertraline (ZOLOFT) 100 MG tablet Take 1.5 tablets (150 mg total) by mouth daily. 145 tablet 1  . umeclidinium-vilanterol (ANORO ELLIPTA) 62.5-25 MCG/INH AEPB Inhale 1 puff into the lungs daily. 60 each 11  . valsartan (DIOVAN) 160 MG tablet TAKE 1 AND 1/2 TABLETS BY MOUTH DAILY. 145 tablet 1   No facility-administered medications prior to visit.    FINAL MEDICATION AS OF TODAY:   Medications after current encounter Current  Outpatient Medications  Medication Instructions  . Ascorbic Acid (VITAMIN C) 500 MG CAPS 1 capsule, Oral, Daily  . aspirin 81 mg, Oral, Daily, Swallow whole.  Marland Kitchen atomoxetine (STRATTERA) 40 mg, Oral, Every morning  . azelastine (ASTELIN) 0.1 % nasal spray PLACE 2 SPRAYS INTO EACH NOSTRIL 2 TIMES PER DAY  . buPROPion (WELLBUTRIN XL) 300 mg, Oral, Every morning  . denosumab (PROLIA) 60 mg, Subcutaneous,  Once, Administer in upper arm, thigh, or abdomen  . [place] 6.5 mg, Vaginal, Daily at bedtime  . loratadine (CLARITIN) 10 mg, Oral, Daily  . metFORMIN (GLUCOPHAGE) 500 mg, Oral, 2 times daily  . metoprolol succinate (TOPROL-XL) 100 mg, Oral, Daily, Take with or immediately following a meal.  . montelukast (SINGULAIR) 10 MG tablet TAKE 1 TABLET BY MOUTH DAILY IN THE EVENING  . Multiple Vitamin (MULTIVITAMIN WITH MINERALS) TABS tablet 1 tablet, Oral, Daily  . nitroGLYCERIN (NITROSTAT) 0.4 MG SL tablet Take 1 tablet sublingually if needed for chest pain, repeat in 5 minutes if needed and call EMS  . omeprazole (PRILOSEC) 40 mg, Oral, Daily PRN, Reported on 04/21/2015  . rosuvastatin (CRESTOR) 20 mg, Oral, Daily  . sertraline (ZOLOFT) 150 mg, Oral, Daily  . umeclidinium-vilanterol (ANORO ELLIPTA) 62.5-25 MCG/INH AEPB 1 puff, Inhalation, Daily  . valsartan (DIOVAN) 160 MG tablet TAKE 1 AND 1/2 TABLETS BY MOUTH DAILY.  Marland Kitchen Vitamin D3 1,000 Units, Oral, Daily    Radiology:   No results found.  Cardiac Studies:   Coronary calcium score 06/21/2020: LM: 50 LAD: 302 LCx: 7 RCA: 322 Total at Agatston score 682.  MESA database percentile 96.  Ascending and descending thoracic aortic measurements normal.  Aortic atherosclerosis.  Visualized lung fields do not suggest any significant abnormality.  PCV ECHOCARDIOGRAM COMPLETE 08/01/2020 Left ventricle cavity is normal in size. Normal left ventricular wall thickness. Normal global wall motion. Doppler evidence of grade I (impaired) diastolic  dysfunction, normal LAP. Normal LV systolic function with visual EF 55-60%. Calculated EF 55%. Left atrial cavity is mildly dilated at 4.3 cm. MV appears normal. Normal thickness. Cannot r/o MVP. Moderate to severe anteriorly directed mitral regurgitation. Structurally normal tricuspid valve. No evidence of tricuspid stenosis. Mild tricuspid regurgitation. No evidence of pulmonary hypertension.   PCV MYOCARDIAL PERFUSION WO LEXISCAN 08/01/2020 Exercise nuclear stress test was performed using Bruce protocol. Patient reached 7.8 METS, and 88% of age predicted maximum heart rate. Exercise capacity was fair. No chest pain reported. Heart rate and hemodynamic response were normal. Stress EKG revealed no ischemic changes. SPECT images show decreased tracer uptake in inferior myocardium in rest and stress images, likely due to breast attenuation imaging performed  in sitting position. In addition, there is a very small sized mild intensity, reversible perfusion defect in apical inferolateral myocardium. Stress LVEF calculated as 43%, although visually appears normal. Low risk study.  EKG:     EKG 07/08/2020: Normal sinus rhythm at rate of 77 bpm, borderline left atrial enlargement, leftward axis.  Single PVC.    Assessment     ICD-10-CM   1. Dyspnea on exertion  R06.09     2. Primary hypertension  I10     3. Agatston CAC score, >400: Total score 682, 96 percentile in [place] 06/21/2020  R93.1     4. Hypercholesteremia  E78.00       There are no discontinued medications.   No orders of the defined types were placed in this encounter.  No orders of the defined types were placed in this encounter.   Recommendations:   STACIE TEMPLIN is a 70 y.o. 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  She  presents here for follow-up of markedly elevated coronary calcium score and dyspnea on exertion and hypertension.  Patient now presents for follow-up.  Patient is tolerating increase Crestor as well as addition of metoprolol without issue.  Her lipids are under excellent control now and at goal.  LDL goal is <70.  Blood pressure is also well controlled.  Her symptoms of dyspnea are probably related to underlying COPD from prior tobacco use disorder and also deconditioning.  She is also trying to do her best with regard to weight loss as well.  She is now established with pulmonary medicine as well.  From cardiac standpoint she is presently doing well, do not suspect her dyspnea to be anginal equivalent with low risk nuclear stress test.  She has no clinical evidence of right-sided heart failure.  Continue present medical therapy, I will see her back in a year however I will see her back sooner if her symptoms get worse or if she develops any chest pain or leg edema.      Adrian Prows, PA-C 11/16/2020, 10:53 AM Office: [phone]

## 2020-11-17 ENCOUNTER — Other Ambulatory Visit (HOSPITAL_COMMUNITY): Payer: Self-pay

## 2020-11-17 ENCOUNTER — Ambulatory Visit: Payer: Medicare Other | Admitting: Cardiology

## 2020-11-18 ENCOUNTER — Other Ambulatory Visit (HOSPITAL_COMMUNITY): Payer: Self-pay

## 2020-11-18 MED ORDER — AZELASTINE HCL 0.1 % NA SOLN
2.0000 | Freq: Two times a day (BID) | NASAL | 0 refills | Status: DC
  Filled 2020-11-18: qty 30, 25d supply, fill #0

## 2020-11-29 ENCOUNTER — Other Ambulatory Visit (HOSPITAL_COMMUNITY): Payer: Self-pay

## 2020-11-29 MED ORDER — ALBUTEROL SULFATE HFA 108 (90 BASE) MCG/ACT IN AERS
2.0000 | INHALATION_SPRAY | RESPIRATORY_TRACT | 0 refills | Status: DC | PRN
  Filled 2020-11-29: qty 18, 17d supply, fill #0

## 2020-11-29 MED ORDER — PREDNISONE 10 MG PO TABS
ORAL_TABLET | ORAL | 0 refills | Status: DC
  Filled 2020-11-29: qty 21, 6d supply, fill #0

## 2020-11-29 MED ORDER — DOXYCYCLINE HYCLATE 100 MG PO TABS
100.0000 mg | ORAL_TABLET | Freq: Two times a day (BID) | ORAL | 0 refills | Status: DC
  Filled 2020-11-29: qty 20, 10d supply, fill #0

## 2020-12-05 ENCOUNTER — Other Ambulatory Visit (HOSPITAL_COMMUNITY): Payer: Self-pay

## 2020-12-05 MED ORDER — PREDNISONE 10 MG (21) PO TBPK
ORAL_TABLET | ORAL | 0 refills | Status: AC
  Filled 2020-12-05: qty 21, 6d supply, fill #0

## 2020-12-08 NOTE — Telephone Encounter (Signed)
Detailed message left per DPR advising patient to return call to office to reschedule prolia injection.

## 2020-12-12 ENCOUNTER — Other Ambulatory Visit (HOSPITAL_COMMUNITY): Payer: Self-pay

## 2020-12-12 MED ORDER — VALSARTAN 160 MG PO TABS
240.0000 mg | ORAL_TABLET | Freq: Every day | ORAL | 1 refills | Status: DC
  Filled 2020-12-12: qty 135, 90d supply, fill #0
  Filled 2021-03-15: qty 135, 90d supply, fill #1

## 2020-12-12 MED ORDER — ROSUVASTATIN CALCIUM 5 MG PO TABS
5.0000 mg | ORAL_TABLET | Freq: Every day | ORAL | 3 refills | Status: DC
  Filled 2020-12-12: qty 90, 90d supply, fill #0

## 2020-12-12 MED ORDER — MONTELUKAST SODIUM 10 MG PO TABS
10.0000 mg | ORAL_TABLET | Freq: Every evening | ORAL | 3 refills | Status: AC
  Filled 2020-12-12 – 2021-11-24 (×2): qty 90, 90d supply, fill #0

## 2020-12-12 MED ORDER — SERTRALINE HCL 100 MG PO TABS
150.0000 mg | ORAL_TABLET | Freq: Every day | ORAL | 1 refills | Status: DC
  Filled 2020-12-12 – 2021-03-15 (×2): qty 135, 90d supply, fill #0

## 2020-12-12 MED ORDER — UMECLIDINIUM-VILANTEROL 62.5-25 MCG/ACT IN AEPB
1.0000 | INHALATION_SPRAY | Freq: Every day | RESPIRATORY_TRACT | 3 refills | Status: DC
  Filled 2020-12-12: qty 60, 30d supply, fill #0

## 2020-12-14 ENCOUNTER — Ambulatory Visit (INDEPENDENT_AMBULATORY_CARE_PROVIDER_SITE_OTHER): Payer: Medicare Other

## 2020-12-14 ENCOUNTER — Other Ambulatory Visit (HOSPITAL_COMMUNITY): Payer: Self-pay

## 2020-12-14 ENCOUNTER — Other Ambulatory Visit: Payer: Self-pay

## 2020-12-14 DIAGNOSIS — M810 Age-related osteoporosis without current pathological fracture: Secondary | ICD-10-CM

## 2020-12-14 MED ORDER — DENOSUMAB 60 MG/ML ~~LOC~~ SOSY
60.0000 mg | PREFILLED_SYRINGE | Freq: Once | SUBCUTANEOUS | Status: AC
  Administered 2020-12-14: 60 mg via SUBCUTANEOUS

## 2020-12-14 NOTE — Progress Notes (Signed)
Patient in today for seventh Prolia injection. Patient's initial calcium level was obtained on 08/09/20.  Result: 9.8.  Last AEX: 06/17/20  Last BMD: 07/05/20  Injection given in right arm.  Patient tolerated injection well.  Routed to provider for review.

## 2020-12-15 NOTE — Telephone Encounter (Signed)
Patient received prolia injection on 12-14-20. Summary of benefits scanned into patient's chart.   Encounter closed.

## 2020-12-19 ENCOUNTER — Other Ambulatory Visit (HOSPITAL_COMMUNITY): Payer: Self-pay

## 2020-12-19 ENCOUNTER — Other Ambulatory Visit: Payer: Self-pay

## 2020-12-19 ENCOUNTER — Ambulatory Visit (INDEPENDENT_AMBULATORY_CARE_PROVIDER_SITE_OTHER): Payer: Medicare Other | Admitting: Family Medicine

## 2020-12-19 ENCOUNTER — Encounter (INDEPENDENT_AMBULATORY_CARE_PROVIDER_SITE_OTHER): Payer: Self-pay | Admitting: Family Medicine

## 2020-12-19 ENCOUNTER — Telehealth (INDEPENDENT_AMBULATORY_CARE_PROVIDER_SITE_OTHER): Payer: Self-pay

## 2020-12-19 VITALS — BP 120/81 | HR 71 | Temp 97.5°F | Ht 67.0 in | Wt 182.0 lb

## 2020-12-19 DIAGNOSIS — R7303 Prediabetes: Secondary | ICD-10-CM | POA: Diagnosis not present

## 2020-12-19 DIAGNOSIS — E669 Obesity, unspecified: Secondary | ICD-10-CM | POA: Diagnosis not present

## 2020-12-19 DIAGNOSIS — Z6830 Body mass index (BMI) 30.0-30.9, adult: Secondary | ICD-10-CM

## 2020-12-19 DIAGNOSIS — K760 Fatty (change of) liver, not elsewhere classified: Secondary | ICD-10-CM | POA: Diagnosis not present

## 2020-12-19 DIAGNOSIS — E8881 Metabolic syndrome: Secondary | ICD-10-CM

## 2020-12-19 MED ORDER — INSULIN PEN NEEDLE 32G X 4 MM MISC
0 refills | Status: DC
  Filled 2020-12-19: qty 100, 90d supply, fill #0

## 2020-12-19 MED ORDER — VICTOZA 18 MG/3ML ~~LOC~~ SOPN
1.8000 mg | PEN_INJECTOR | Freq: Every day | SUBCUTANEOUS | 2 refills | Status: DC
  Filled 2020-12-19 (×2): qty 9, 30d supply, fill #0

## 2020-12-19 NOTE — Telephone Encounter (Signed)
RX sent

## 2020-12-19 NOTE — Telephone Encounter (Signed)
Pharmacy called in and stated that they need a order for pen needles sent in for Victoza. Please advise

## 2020-12-20 ENCOUNTER — Other Ambulatory Visit (HOSPITAL_COMMUNITY): Payer: Self-pay

## 2020-12-20 NOTE — Progress Notes (Signed)
Chief Complaint:   OBESITY Sharon Daniels is here to discuss her progress with her obesity treatment plan along with follow-up of her obesity related diagnoses. See Medical Weight Management Flowsheet for complete bioelectrical impedance results.  Today's visit was #: 6 Starting weight: 192 lbs Starting date: 08/09/2020 Weight change since last visit: 5 lbs Total lbs lost to date: 10 lbs Total weight loss percentage to date: -5.21%  Nutrition Plan: Keeping a food journal and adhering to recommended goals of 1200 calories and 95 grams of protein daily for 90% of the time. Activity: Cardiac Rehab program for 40 minutes 3 times per week.   Interim History: Sharon Daniels is taking Saxenda 1.2 mg daily (samples - will run out soon).  She says it is helpful to decrease hunger.  Assessment/Plan:   1. Metabolic syndrome Starting goal: Lose 7-10% of starting weight. She will continue to focus on protein-rich, low simple carbohydrate foods. We reviewed the importance of hydration, regular exercise for stress reduction, and restorative sleep.  We will continue to check lab work every 3 months, with 10% weight loss, or should any other concerns arise.  - Increase liraglutide (VICTOZA) 18 MG/3ML SOPN; Inject 1.8 mg into the skin daily.  Dispense: 9 mL; Refill: 2 - Insulin Pen Needle 32G X 4 MM MISC; Use to administer Victoza  Dispense: 100 each; Refill: 0  2. Prediabetes Not at goal. Goal is HgbA1c < 5.7.  Medication: Saxenda 1.2 mg subcutaneously daily.    Plan:  Increase Victoza to 1.8 mg subcutaneously daily.  She will continue to focus on protein-rich, low simple carbohydrate foods. We reviewed the importance of hydration, regular exercise for stress reduction, and restorative sleep.   Lab Results  Component Value Date   HGBA1C 6.0 (H) 08/09/2020   Lab Results  Component Value Date   INSULIN 12.6 08/09/2020   - Increase liraglutide (VICTOZA) 18 MG/3ML SOPN; Inject 1.8 mg into the skin  daily.  Dispense: 9 mL; Refill: 2  3. Fatty liver Intensive lifestyle modifications are the first line treatment for this issue. We discussed several lifestyle modifications today and she will continue to work on diet, exercise and weight loss efforts.   Counseling: NAFLD is an umbrella term that encompasses a disease spectrum that includes steatosis (fat) without inflammation, steatohepatitis (NASH; fat + inflammation in a characteristic pattern), and cirrhosis. Bland steatosis is felt to be a benign condition, with extremely low to no risk of progression to cirrhosis, whereas NASH can progress to cirrhosis. The mainstay of treatment of NAFLD includes lifestyle modification to achieve weight loss, at least 7% of current body weight. Low carbohydrate diets can be beneficial in improving NAFLD liver histology. Additionally, exercise, even the absence of weight loss can have beneficial effects on the patient's metabolic profile and liver health. We recommend that their metabolic comorbidities be aggressively managed, as patients with NAFLD are at increased risk of coronary artery disease.  - Increase liraglutide (VICTOZA) 18 MG/3ML SOPN; Inject 1.8 mg into the skin daily.  Dispense: 9 mL; Refill: 2  4. Obesity, current BMI 28.6  Course: Sharon Daniels is currently in the action stage of change. As such, her goal is to continue with weight loss efforts.   Nutrition goals: She has agreed to keeping a food journal and adhering to recommended goals of 1200 calories and 95 grams of protein.   Exercise goals:  As is.  Behavioral modification strategies: increasing lean protein intake, decreasing simple carbohydrates, and increasing vegetables.  Sharon Daniels has  agreed to follow-up with our clinic in 4 weeks. She was informed of the importance of frequent follow-up visits to maximize her success with intensive lifestyle modifications for her multiple health conditions.   Objective:   Blood pressure 120/81,  pulse 71, temperature (!) 97.5 F (36.4 C), height 5\' 7"  (1.702 m), weight 182 lb (82.6 kg), SpO2 99 %. Body mass index is 28.51 kg/m.  General: Cooperative, alert, well developed, in no acute distress. HEENT: Conjunctivae and lids unremarkable. Cardiovascular: Regular rhythm.  Lungs: Normal work of breathing. Neurologic: No focal deficits.   Lab Results  Component Value Date   CREATININE 0.83 08/09/2020   BUN 15 08/09/2020   NA 141 08/09/2020   K 4.9 08/09/2020   CL 102 08/09/2020   CO2 25 08/09/2020   Lab Results  Component Value Date   ALT 24 08/09/2020   AST 26 08/09/2020   ALKPHOS 69 08/09/2020   BILITOT 0.3 08/09/2020   Lab Results  Component Value Date   HGBA1C 6.0 (H) 08/09/2020   Lab Results  Component Value Date   INSULIN 12.6 08/09/2020   Lab Results  Component Value Date   TSH 2.300 08/09/2020   Lab Results  Component Value Date   CHOL 146 08/09/2020   HDL 61 08/09/2020   LDLCALC 70 08/09/2020   TRIG 79 08/09/2020   CHOLHDL 2.4 08/09/2020   Lab Results  Component Value Date   VD25OH 59.9 08/09/2020   VD25OH 54 04/26/2016   VD25OH 32 04/29/2015   Lab Results  Component Value Date   WBC 6.5 08/09/2020   HGB 12.3 08/09/2020   HCT 37.8 08/09/2020   MCV 100 (H) 08/09/2020   PLT 242 08/09/2020   Lab Results  Component Value Date   IRON 84 06/21/2005   TIBC 353 06/21/2005   FERRITIN 55 06/21/2005   Attestation Statements:   Reviewed by clinician on day of visit: allergies, medications, problem list, medical history, surgical history, family history, social history, and previous encounter notes.  I, Water quality scientist, CMA, am acting as transcriptionist for Briscoe Deutscher, DO  I have reviewed the above documentation for accuracy and completeness, and I agree with the above. -  Briscoe Deutscher, DO, MS, FAAFP, DABOM - Family and Bariatric Medicine.

## 2021-01-11 ENCOUNTER — Encounter: Payer: Self-pay | Admitting: Student

## 2021-01-12 ENCOUNTER — Other Ambulatory Visit (HOSPITAL_COMMUNITY): Payer: Self-pay

## 2021-01-31 ENCOUNTER — Ambulatory Visit (INDEPENDENT_AMBULATORY_CARE_PROVIDER_SITE_OTHER): Payer: Medicare Other | Admitting: Family Medicine

## 2021-01-31 ENCOUNTER — Encounter (INDEPENDENT_AMBULATORY_CARE_PROVIDER_SITE_OTHER): Payer: Self-pay | Admitting: Family Medicine

## 2021-01-31 ENCOUNTER — Other Ambulatory Visit: Payer: Self-pay

## 2021-01-31 VITALS — BP 103/68 | HR 80 | Temp 97.7°F | Ht 67.0 in | Wt 180.0 lb

## 2021-01-31 DIAGNOSIS — F39 Unspecified mood [affective] disorder: Secondary | ICD-10-CM | POA: Diagnosis not present

## 2021-01-31 DIAGNOSIS — R7303 Prediabetes: Secondary | ICD-10-CM | POA: Diagnosis not present

## 2021-01-31 DIAGNOSIS — E669 Obesity, unspecified: Secondary | ICD-10-CM

## 2021-01-31 DIAGNOSIS — E8881 Metabolic syndrome: Secondary | ICD-10-CM

## 2021-01-31 DIAGNOSIS — Z6828 Body mass index (BMI) 28.0-28.9, adult: Secondary | ICD-10-CM

## 2021-01-31 DIAGNOSIS — K760 Fatty (change of) liver, not elsewhere classified: Secondary | ICD-10-CM | POA: Diagnosis not present

## 2021-02-02 NOTE — Progress Notes (Signed)
Chief Complaint:   OBESITY Sharon Daniels is here to discuss her progress with her obesity treatment plan along with follow-up of her obesity related diagnoses. See Medical Weight Management Flowsheet for complete bioelectrical impedance results.  Today's visit was #: 7 Starting weight: 192 lbs Starting date: 08/09/2020 Weight change since last visit: 2 lbs Total lbs lost to date: 12 lbs Total weight loss percentage to date: -6.25%  Nutrition Plan: Keeping a food journal and adhering to recommended goals of 1200 calories and 95 grams of protein daily for 73% of the time. Activity: Cardio/walking 3 miles for 40 minutes 3-4 times per week.  Anti-obesity medications: Victoza 1.8 mg subcutaneously daily. Reported side effects: None.  Interim History: [name] is on Victoza 1.2 mg.   Assessment/Plan:   1. Metabolic syndrome Starting goal: Lose 7-10% of starting weight. She will continue to focus on protein-rich, low simple carbohydrate foods. We reviewed the importance of hydration, regular exercise for stress reduction, and restorative sleep.  We will continue to check lab work every 3 months, with 10% weight loss, or should any other concerns arise.  2. Fatty liver Intensive lifestyle modifications are the first line treatment for this issue. We discussed several lifestyle modifications today and she will continue to work on diet, exercise and weight loss efforts.   Counseling: NAFLD is an umbrella term that encompasses a disease spectrum that includes steatosis (fat) without inflammation, steatohepatitis (NASH; fat + inflammation in a characteristic pattern), and cirrhosis. Bland steatosis is felt to be a benign condition, with extremely low to no risk of progression to cirrhosis, whereas NASH can progress to cirrhosis. The mainstay of treatment of NAFLD includes lifestyle modification to achieve weight loss, at least 7% of current body weight. Low carbohydrate diets can be beneficial in  improving NAFLD liver histology. Additionally, exercise, even the absence of weight loss can have beneficial effects on the patient's metabolic profile and liver health. We recommend that their metabolic comorbidities be aggressively managed, as patients with NAFLD are at increased risk of coronary artery disease.  3. Emotional eating tendencies Medication: Wellbutrin XL 150 mg daily, Zoloft 150 mg daily.   Plan:  Motivational interviewing as well as evidence-based interventions for health behavior change were utilized today including the discussion of self monitoring techniques, problem-solving barriers and SMART goal setting techniques.    4. Obesity, current BMI 28.3  Course: Sharon Daniels is currently in the action stage of change. As such, her goal is to continue with weight loss efforts.   Nutrition goals: She has agreed to keeping a food journal and adhering to recommended goals of 1200 calories and 95 grams of protein.   Exercise goals:  As is.  Behavioral modification strategies: increasing lean protein intake, decreasing simple carbohydrates, increasing vegetables, and increasing water intake.  [name] has agreed to follow-up with our clinic in 4 weeks. She was informed of the importance of frequent follow-up visits to maximize her success with intensive lifestyle modifications for her multiple health conditions.   Objective:   Blood pressure 103/68, pulse 80, temperature 97.7 F (36.5 C), temperature source Oral, height 5\' 7"  (1.702 m), weight 180 lb (81.6 kg), SpO2 98 %. Body mass index is 28.19 kg/m.  General: Cooperative, alert, well developed, in no acute distress. HEENT: Conjunctivae and lids unremarkable. Cardiovascular: Regular rhythm.  Lungs: Normal work of breathing. Neurologic: No focal deficits.   Lab Results  Component Value Date   CREATININE 0.83 08/09/2020   BUN 15 08/09/2020   NA  141 08/09/2020   K 4.9 08/09/2020   CL 102 08/09/2020   CO2 25 08/09/2020    Lab Results  Component Value Date   ALT 24 08/09/2020   AST 26 08/09/2020   ALKPHOS 69 08/09/2020   BILITOT 0.3 08/09/2020   Lab Results  Component Value Date   HGBA1C 6.0 (H) 08/09/2020   Lab Results  Component Value Date   INSULIN 12.6 08/09/2020   Lab Results  Component Value Date   TSH 2.300 08/09/2020   Lab Results  Component Value Date   CHOL 146 08/09/2020   HDL 61 08/09/2020   LDLCALC 70 08/09/2020   TRIG 79 08/09/2020   CHOLHDL 2.4 08/09/2020   Lab Results  Component Value Date   VD25OH 59.9 08/09/2020   VD25OH 54 04/26/2016   VD25OH 32 04/29/2015   Lab Results  Component Value Date   WBC 6.5 08/09/2020   HGB 12.3 08/09/2020   HCT 37.8 08/09/2020   MCV 100 (H) 08/09/2020   PLT 242 08/09/2020   Lab Results  Component Value Date   IRON 84 06/21/2005   TIBC 353 06/21/2005   FERRITIN 55 06/21/2005   Attestation Statements:   Reviewed by clinician on day of visit: allergies, medications, problem list, medical history, surgical history, family history, social history, and previous encounter notes.  I, Water quality scientist, CMA, am acting as transcriptionist for Briscoe Deutscher, DO  I have reviewed the above documentation for accuracy and completeness, and I agree with the above. -  Briscoe Deutscher, DO, MS, FAAFP, DABOM - Family and Bariatric Medicine.

## 2021-02-07 ENCOUNTER — Other Ambulatory Visit (HOSPITAL_COMMUNITY): Payer: Self-pay

## 2021-02-07 MED ORDER — VICTOZA 18 MG/3ML ~~LOC~~ SOPN
1.8000 mg | PEN_INJECTOR | Freq: Every day | SUBCUTANEOUS | 2 refills | Status: DC
  Filled 2021-02-07 – 2021-02-17 (×2): qty 9, 30d supply, fill #0

## 2021-02-09 ENCOUNTER — Encounter (INDEPENDENT_AMBULATORY_CARE_PROVIDER_SITE_OTHER): Payer: Self-pay | Admitting: Family Medicine

## 2021-02-09 DIAGNOSIS — Z79899 Other long term (current) drug therapy: Secondary | ICD-10-CM

## 2021-02-09 DIAGNOSIS — R5383 Other fatigue: Secondary | ICD-10-CM

## 2021-02-09 DIAGNOSIS — R7303 Prediabetes: Secondary | ICD-10-CM

## 2021-02-09 NOTE — Telephone Encounter (Signed)
Last OV with Dr Wallace 

## 2021-02-15 ENCOUNTER — Other Ambulatory Visit (HOSPITAL_COMMUNITY): Payer: Self-pay

## 2021-02-17 ENCOUNTER — Other Ambulatory Visit (HOSPITAL_COMMUNITY): Payer: Self-pay

## 2021-03-09 ENCOUNTER — Other Ambulatory Visit (HOSPITAL_COMMUNITY): Payer: Self-pay

## 2021-03-15 ENCOUNTER — Other Ambulatory Visit (HOSPITAL_COMMUNITY): Payer: Self-pay

## 2021-03-16 ENCOUNTER — Ambulatory Visit (INDEPENDENT_AMBULATORY_CARE_PROVIDER_SITE_OTHER): Payer: Medicare Other | Admitting: Family Medicine

## 2021-04-03 ENCOUNTER — Other Ambulatory Visit: Payer: Self-pay

## 2021-04-03 ENCOUNTER — Ambulatory Visit (INDEPENDENT_AMBULATORY_CARE_PROVIDER_SITE_OTHER): Payer: Medicare Other | Admitting: Family Medicine

## 2021-04-03 ENCOUNTER — Encounter (INDEPENDENT_AMBULATORY_CARE_PROVIDER_SITE_OTHER): Payer: Self-pay | Admitting: Family Medicine

## 2021-04-03 ENCOUNTER — Other Ambulatory Visit (HOSPITAL_COMMUNITY): Payer: Self-pay

## 2021-04-03 VITALS — BP 115/73 | HR 70 | Temp 97.6°F | Ht 67.0 in | Wt 175.0 lb

## 2021-04-03 DIAGNOSIS — Z6827 Body mass index (BMI) 27.0-27.9, adult: Secondary | ICD-10-CM

## 2021-04-03 DIAGNOSIS — K760 Fatty (change of) liver, not elsewhere classified: Secondary | ICD-10-CM

## 2021-04-03 DIAGNOSIS — E8881 Metabolic syndrome: Secondary | ICD-10-CM

## 2021-04-03 DIAGNOSIS — F39 Unspecified mood [affective] disorder: Secondary | ICD-10-CM

## 2021-04-03 DIAGNOSIS — E669 Obesity, unspecified: Secondary | ICD-10-CM

## 2021-04-03 DIAGNOSIS — R7303 Prediabetes: Secondary | ICD-10-CM

## 2021-04-03 DIAGNOSIS — M810 Age-related osteoporosis without current pathological fracture: Secondary | ICD-10-CM | POA: Diagnosis not present

## 2021-04-03 MED ORDER — VICTOZA 18 MG/3ML ~~LOC~~ SOPN
1.8000 mg | PEN_INJECTOR | Freq: Every day | SUBCUTANEOUS | 2 refills | Status: DC
  Filled 2021-04-03: qty 9, 30d supply, fill #0
  Filled 2021-05-12: qty 9, 30d supply, fill #1
  Filled 2021-06-18: qty 9, 30d supply, fill #2

## 2021-04-06 ENCOUNTER — Other Ambulatory Visit (HOSPITAL_COMMUNITY): Payer: Self-pay

## 2021-04-07 ENCOUNTER — Other Ambulatory Visit (HOSPITAL_COMMUNITY): Payer: Self-pay

## 2021-04-11 NOTE — Progress Notes (Addendum)
Chief Complaint:   OBESITY Sharon Daniels is here to discuss her progress with her obesity treatment plan along with follow-up of her obesity related diagnoses. See Medical Weight Management Flowsheet for complete bioelectrical impedance results.  Today's visit was #: 8 Starting weight: 192 lbs Starting date: 08/09/2020 Weight change since last visit: 5 lbs Total lbs lost to date: 17 lbs Total weight loss percentage to date: -9.95%  Nutrition Plan: Keeping a food journal and adhering to recommended goals of 1200 calories and 85 grams of protein daily for 85% of the time. Activity: Cardio for 30 minutes 3 times per week.  Interim History: Sharon Daniels says that Victoza 1.2 mg subcutaneously daily has been helpful.  She endorses less polyphagia.  Assessment/Plan:   1. Metabolic syndrome Starting goal: Lose 7-10% of starting weight. She will continue to focus on protein-rich, low simple carbohydrate foods. We reviewed the importance of hydration, regular exercise for stress reduction, and restorative sleep.  We will continue to check lab work every 3 months, with 10% weight loss, or should any other concerns arise.  - Increase liraglutide (VICTOZA) 18 MG/3ML SOPN; Inject 1.8 mg into the skin daily.  Dispense: 9 mL; Refill: 2  2. Age-related osteoporosis without current pathological fracture Sharon Daniels is on Prolia injections every 6 months.  She is also taking OTC vitamin D 1,000 IU daily.   3. Fatty liver Intensive lifestyle modifications are the first line treatment for this issue. We discussed several lifestyle modifications today and she will continue to work on diet, exercise and weight loss efforts.   Counseling: NAFLD is an umbrella term that encompasses a disease spectrum that includes steatosis (fat) without inflammation, steatohepatitis (NASH; fat + inflammation in a characteristic pattern), and cirrhosis. Bland steatosis is felt to be a benign condition, with extremely low to no  risk of progression to cirrhosis, whereas NASH can progress to cirrhosis. The mainstay of treatment of NAFLD includes lifestyle modification to achieve weight loss, at least 7% of current body weight. Low carbohydrate diets can be beneficial in improving NAFLD liver histology. Additionally, exercise, even the absence of weight loss can have beneficial effects on the patient's metabolic profile and liver health. We recommend that their metabolic comorbidities be aggressively managed, as patients with NAFLD are at increased risk of coronary artery disease.  4. Emotional eating tendencies Improving, but not optimized. Medication: Wellbutrin XL 300 mg daily and Zoloft 100 mg daily.   Plan: Discussed cues and consequences, how thoughts affect eating, model of thoughts, feelings, and behaviors, and strategies for change by focusing on the cue. Discussed cognitive distortions, coping thoughts, and how to change your thoughts.  5. Obesity, current BMI 27.5  Course: Sharon Daniels is currently in the action stage of change. As such, her goal is to continue with weight loss efforts.   Nutrition goals: She has agreed to keeping a food journal and adhering to recommended goals of 1200 calories and 95 grams of protein.   Exercise goals:  As is.  Behavioral modification strategies: increasing lean protein intake, decreasing simple carbohydrates, increasing vegetables, and increasing water intake.  Sharon Daniels has agreed to follow-up with our clinic in 4-6 weeks. She was informed of the importance of frequent follow-up visits to maximize her success with intensive lifestyle modifications for her multiple health conditions.   Objective:   Blood pressure 115/73, pulse 70, temperature 97.6 F (36.4 C), temperature source Oral, height '5\' 7"'$  (1.702 m), weight 175 lb (79.4 kg), SpO2 98 %. Body mass index  is 27.41 kg/m.  General: Cooperative, alert, well developed, in no acute distress. HEENT: Conjunctivae and lids  unremarkable. Cardiovascular: Regular rhythm.  Lungs: Normal work of breathing. Neurologic: No focal deficits.   Lab Results  Component Value Date   CREATININE 0.83 08/09/2020   BUN 15 08/09/2020   NA 141 08/09/2020   K 4.9 08/09/2020   CL 102 08/09/2020   CO2 25 08/09/2020   Lab Results  Component Value Date   ALT 24 08/09/2020   AST 26 08/09/2020   ALKPHOS 69 08/09/2020   BILITOT 0.3 08/09/2020   Lab Results  Component Value Date   HGBA1C 6.0 (H) 08/09/2020   Lab Results  Component Value Date   INSULIN 12.6 08/09/2020   Lab Results  Component Value Date   TSH 2.300 08/09/2020   Lab Results  Component Value Date   CHOL 146 08/09/2020   HDL 61 08/09/2020   LDLCALC 70 08/09/2020   TRIG 79 08/09/2020   CHOLHDL 2.4 08/09/2020   Lab Results  Component Value Date   VD25OH 59.9 08/09/2020   VD25OH 54 04/26/2016   VD25OH 32 04/29/2015   Lab Results  Component Value Date   WBC 6.5 08/09/2020   HGB 12.3 08/09/2020   HCT 37.8 08/09/2020   MCV 100 (H) 08/09/2020   PLT 242 08/09/2020   Lab Results  Component Value Date   IRON 84 06/21/2005   TIBC 353 06/21/2005   FERRITIN 55 06/21/2005   Attestation Statements:   Reviewed by clinician on day of visit: allergies, medications, problem list, medical history, surgical history, family history, social history, and previous encounter notes.  I, Water quality scientist, CMA, am acting as transcriptionist for Briscoe Deutscher, DO  I have reviewed the above documentation for accuracy and completeness, and I agree with the above. -  Briscoe Deutscher, DO, MS, FAAFP, DABOM - Family and Bariatric Medicine.

## 2021-05-12 ENCOUNTER — Other Ambulatory Visit (HOSPITAL_COMMUNITY): Payer: Self-pay

## 2021-05-18 ENCOUNTER — Other Ambulatory Visit (HOSPITAL_COMMUNITY): Payer: Self-pay

## 2021-05-18 MED ORDER — ROSUVASTATIN CALCIUM 20 MG PO TABS
20.0000 mg | ORAL_TABLET | Freq: Every day | ORAL | 3 refills | Status: DC
Start: 2021-05-18 — End: 2022-05-08
  Filled 2021-05-18: qty 90, 90d supply, fill #0
  Filled 2021-07-27: qty 90, 90d supply, fill #1
  Filled 2022-01-10: qty 90, 90d supply, fill #2

## 2021-05-18 MED ORDER — BUPROPION HCL ER (XL) 300 MG PO TB24
300.0000 mg | ORAL_TABLET | Freq: Every morning | ORAL | 1 refills | Status: DC
Start: 2021-05-18 — End: 2021-11-16
  Filled 2021-05-18: qty 90, 90d supply, fill #0
  Filled 2021-07-27: qty 90, 90d supply, fill #1

## 2021-05-18 MED ORDER — OMEPRAZOLE 40 MG PO CPDR
40.0000 mg | DELAYED_RELEASE_CAPSULE | Freq: Every day | ORAL | 1 refills | Status: DC
  Filled 2021-05-18: qty 120, 60d supply, fill #0
  Filled 2021-08-18: qty 120, 60d supply, fill #1
  Filled 2021-11-03: qty 50, 25d supply, fill #2

## 2021-05-18 MED ORDER — VALSARTAN 160 MG PO TABS
240.0000 mg | ORAL_TABLET | Freq: Every day | ORAL | 1 refills | Status: DC
  Filled 2021-05-18: qty 135, 90d supply, fill #0
  Filled 2021-08-18: qty 135, 90d supply, fill #1
  Filled 2021-12-08: qty 20, 13d supply, fill #2

## 2021-05-18 MED ORDER — ATOMOXETINE HCL 40 MG PO CAPS
40.0000 mg | ORAL_CAPSULE | Freq: Every morning | ORAL | 1 refills | Status: DC
  Filled 2021-05-18: qty 90, 90d supply, fill #0

## 2021-05-18 MED ORDER — OMEPRAZOLE 40 MG PO CPDR
40.0000 mg | DELAYED_RELEASE_CAPSULE | ORAL | 1 refills | Status: DC
  Filled 2021-05-18: qty 145, 73d supply, fill #0

## 2021-05-18 MED ORDER — SERTRALINE HCL 100 MG PO TABS
150.0000 mg | ORAL_TABLET | Freq: Every day | ORAL | 1 refills | Status: DC
  Filled 2021-05-18: qty 135, 90d supply, fill #0

## 2021-05-18 MED ORDER — MONTELUKAST SODIUM 10 MG PO TABS
10.0000 mg | ORAL_TABLET | Freq: Every evening | ORAL | 3 refills | Status: DC
  Filled 2021-05-18: qty 90, 90d supply, fill #0
  Filled 2021-08-18: qty 90, 90d supply, fill #1

## 2021-05-18 MED ORDER — INTRAROSA 6.5 MG VA INST
1.0000 | VAGINAL_INSERT | VAGINAL | 11 refills | Status: DC
  Filled 2021-05-18: qty 28, 56d supply, fill #0

## 2021-05-19 ENCOUNTER — Other Ambulatory Visit (HOSPITAL_COMMUNITY): Payer: Self-pay

## 2021-05-19 MED ORDER — INTRAROSA 6.5 MG VA INST
VAGINAL_INSERT | Freq: Every day | VAGINAL | 11 refills | Status: DC
Start: 2021-05-19 — End: 2021-11-16
  Filled 2021-05-19 (×2): qty 28, 28d supply, fill #0
  Filled 2021-08-18: qty 28, 28d supply, fill #1

## 2021-05-22 ENCOUNTER — Other Ambulatory Visit (HOSPITAL_COMMUNITY): Payer: Self-pay

## 2021-05-30 ENCOUNTER — Other Ambulatory Visit (HOSPITAL_COMMUNITY): Payer: Self-pay

## 2021-06-19 ENCOUNTER — Other Ambulatory Visit (HOSPITAL_COMMUNITY): Payer: Self-pay

## 2021-06-21 ENCOUNTER — Other Ambulatory Visit (HOSPITAL_COMMUNITY): Payer: Self-pay

## 2021-06-21 MED ORDER — VICTOZA 18 MG/3ML ~~LOC~~ SOPN
1.2000 mg | PEN_INJECTOR | Freq: Every day | SUBCUTANEOUS | 3 refills | Status: DC
  Filled 2021-06-21 – 2021-07-18 (×2): qty 6, 30d supply, fill #0

## 2021-06-22 ENCOUNTER — Other Ambulatory Visit (HOSPITAL_COMMUNITY): Payer: Self-pay

## 2021-06-22 MED ORDER — INSULIN PEN NEEDLE 31G X 6 MM MISC
Freq: Every day | 0 refills | Status: DC
  Filled 2021-06-22: qty 100, 100d supply, fill #0
  Filled 2021-12-14: qty 100, 90d supply, fill #0

## 2021-06-30 ENCOUNTER — Other Ambulatory Visit (HOSPITAL_COMMUNITY): Payer: Self-pay

## 2021-07-03 ENCOUNTER — Telehealth: Payer: Self-pay | Admitting: *Deleted

## 2021-07-04 ENCOUNTER — Ambulatory Visit (INDEPENDENT_AMBULATORY_CARE_PROVIDER_SITE_OTHER): Payer: Medicare Other

## 2021-07-04 DIAGNOSIS — M810 Age-related osteoporosis without current pathological fracture: Secondary | ICD-10-CM

## 2021-07-04 MED ORDER — DENOSUMAB 60 MG/ML ~~LOC~~ SOSY
60.0000 mg | PREFILLED_SYRINGE | Freq: Once | SUBCUTANEOUS | Status: AC
  Administered 2021-07-04: 60 mg via SUBCUTANEOUS

## 2021-07-18 ENCOUNTER — Other Ambulatory Visit: Payer: Self-pay | Admitting: Student

## 2021-07-18 ENCOUNTER — Other Ambulatory Visit (HOSPITAL_COMMUNITY): Payer: Self-pay

## 2021-07-18 DIAGNOSIS — I10 Essential (primary) hypertension: Secondary | ICD-10-CM

## 2021-07-19 ENCOUNTER — Other Ambulatory Visit (HOSPITAL_COMMUNITY): Payer: Self-pay

## 2021-07-19 MED ORDER — METOPROLOL SUCCINATE ER 50 MG PO TB24
100.0000 mg | ORAL_TABLET | Freq: Every day | ORAL | 2 refills | Status: DC
  Filled 2021-07-19: qty 180, 90d supply, fill #0
  Filled 2021-09-29: qty 180, 90d supply, fill #1
  Filled 2022-02-22: qty 180, 90d supply, fill #2

## 2021-07-24 ENCOUNTER — Other Ambulatory Visit (HOSPITAL_COMMUNITY): Payer: Self-pay

## 2021-07-24 MED ORDER — SERTRALINE HCL 100 MG PO TABS
150.0000 mg | ORAL_TABLET | Freq: Every day | ORAL | 3 refills | Status: DC
  Filled 2021-07-24: qty 135, 90d supply, fill #0
  Filled 2021-09-29: qty 135, 90d supply, fill #1
  Filled 2022-01-22: qty 135, 90d supply, fill #2
  Filled 2022-05-07: qty 135, 90d supply, fill #3

## 2021-07-24 MED ORDER — VICTOZA 18 MG/3ML ~~LOC~~ SOPN
1.8000 mg | PEN_INJECTOR | Freq: Every day | SUBCUTANEOUS | 0 refills | Status: DC
  Filled 2021-07-24: qty 9, 30d supply, fill #0

## 2021-07-24 MED ORDER — BUPROPION HCL ER (XL) 300 MG PO TB24
300.0000 mg | ORAL_TABLET | Freq: Every morning | ORAL | 3 refills | Status: AC
  Filled 2021-07-24 – 2021-11-24 (×2): qty 90, 90d supply, fill #0
  Filled 2022-02-22: qty 90, 90d supply, fill #1
  Filled 2022-03-06 – 2022-05-30 (×2): qty 90, 90d supply, fill #2

## 2021-07-27 ENCOUNTER — Other Ambulatory Visit (HOSPITAL_COMMUNITY): Payer: Self-pay

## 2021-08-15 ENCOUNTER — Other Ambulatory Visit (HOSPITAL_COMMUNITY): Payer: Self-pay

## 2021-08-15 MED ORDER — DICLOFENAC SODIUM 1 % EX GEL
4.0000 g | Freq: Four times a day (QID) | CUTANEOUS | 11 refills | Status: AC
  Filled 2021-08-15: qty 500, 31d supply, fill #0
  Filled 2021-12-14: qty 500, 31d supply, fill #1

## 2021-08-15 MED ORDER — ATOMOXETINE HCL 80 MG PO CAPS
80.0000 mg | ORAL_CAPSULE | Freq: Every morning | ORAL | 0 refills | Status: DC
  Filled 2021-08-15: qty 90, 90d supply, fill #0

## 2021-08-16 ENCOUNTER — Encounter (INDEPENDENT_AMBULATORY_CARE_PROVIDER_SITE_OTHER): Payer: Self-pay

## 2021-08-18 ENCOUNTER — Other Ambulatory Visit (HOSPITAL_COMMUNITY): Payer: Self-pay

## 2021-08-21 ENCOUNTER — Other Ambulatory Visit (HOSPITAL_COMMUNITY): Payer: Self-pay

## 2021-08-21 MED ORDER — VICTOZA 18 MG/3ML ~~LOC~~ SOPN
1.8000 mg | PEN_INJECTOR | Freq: Every day | SUBCUTANEOUS | 1 refills | Status: DC
  Filled 2021-08-21: qty 9, 30d supply, fill #0
  Filled 2021-09-29: qty 9, 30d supply, fill #1

## 2021-09-04 ENCOUNTER — Other Ambulatory Visit (HOSPITAL_COMMUNITY): Payer: Self-pay

## 2021-09-04 MED ORDER — AZITHROMYCIN 500 MG PO TABS
500.0000 mg | ORAL_TABLET | Freq: Every day | ORAL | 0 refills | Status: DC
  Filled 2021-09-04: qty 3, 3d supply, fill #0

## 2021-09-28 ENCOUNTER — Other Ambulatory Visit (HOSPITAL_COMMUNITY): Payer: Self-pay

## 2021-09-28 MED ORDER — ATOMOXETINE HCL 80 MG PO CAPS
80.0000 mg | ORAL_CAPSULE | Freq: Every day | ORAL | 1 refills | Status: DC
  Filled 2021-09-28 – 2021-11-24 (×2): qty 90, 90d supply, fill #0
  Filled 2022-03-06: qty 90, 90d supply, fill #1

## 2021-09-28 MED ORDER — SERTRALINE HCL 100 MG PO TABS
150.0000 mg | ORAL_TABLET | Freq: Every day | ORAL | 3 refills | Status: DC
  Filled 2021-09-28: qty 135, 90d supply, fill #0

## 2021-09-28 MED ORDER — TOBRAMYCIN 0.3 % OP SOLN
2.0000 [drp] | OPHTHALMIC | 0 refills | Status: DC
  Filled 2021-09-28: qty 5, 5d supply, fill #0

## 2021-09-29 ENCOUNTER — Other Ambulatory Visit (HOSPITAL_COMMUNITY): Payer: Self-pay

## 2021-10-02 ENCOUNTER — Other Ambulatory Visit (HOSPITAL_COMMUNITY): Payer: Self-pay

## 2021-10-25 ENCOUNTER — Other Ambulatory Visit (HOSPITAL_COMMUNITY): Payer: Self-pay

## 2021-10-25 MED ORDER — VICTOZA 18 MG/3ML ~~LOC~~ SOPN
2.4000 mg | PEN_INJECTOR | Freq: Every day | SUBCUTANEOUS | 1 refills | Status: DC
  Filled 2021-10-25: qty 9, 30d supply, fill #0

## 2021-10-25 MED ORDER — VICTOZA 18 MG/3ML ~~LOC~~ SOPN
1.8000 mg | PEN_INJECTOR | Freq: Every day | SUBCUTANEOUS | 1 refills | Status: DC
  Filled 2021-10-25: qty 9, 30d supply, fill #0
  Filled 2021-12-13: qty 9, 30d supply, fill #1

## 2021-10-26 ENCOUNTER — Other Ambulatory Visit (HOSPITAL_COMMUNITY): Payer: Self-pay

## 2021-11-03 ENCOUNTER — Other Ambulatory Visit (HOSPITAL_COMMUNITY): Payer: Self-pay

## 2021-11-09 ENCOUNTER — Other Ambulatory Visit (HOSPITAL_COMMUNITY): Payer: Self-pay

## 2021-11-09 MED ORDER — METOPROLOL SUCCINATE ER 50 MG PO TB24
50.0000 mg | ORAL_TABLET | Freq: Two times a day (BID) | ORAL | 3 refills | Status: DC
  Filled 2021-11-09: qty 180, 90d supply, fill #0

## 2021-11-09 MED ORDER — VALSARTAN 160 MG PO TABS
240.0000 mg | ORAL_TABLET | Freq: Every day | ORAL | 1 refills | Status: DC
  Filled 2021-11-09: qty 135, 90d supply, fill #0

## 2021-11-10 ENCOUNTER — Other Ambulatory Visit (HOSPITAL_COMMUNITY): Payer: Self-pay

## 2021-11-16 ENCOUNTER — Ambulatory Visit: Payer: Medicare Other | Admitting: Cardiology

## 2021-11-16 ENCOUNTER — Other Ambulatory Visit (HOSPITAL_COMMUNITY): Payer: Self-pay

## 2021-11-16 ENCOUNTER — Encounter: Payer: Self-pay | Admitting: Cardiology

## 2021-11-16 VITALS — BP 129/78 | HR 64 | Temp 98.5°F | Resp 16 | Ht 67.0 in | Wt 170.6 lb

## 2021-11-16 DIAGNOSIS — I10 Essential (primary) hypertension: Secondary | ICD-10-CM

## 2021-11-16 DIAGNOSIS — I25118 Atherosclerotic heart disease of native coronary artery with other forms of angina pectoris: Secondary | ICD-10-CM

## 2021-11-16 DIAGNOSIS — R931 Abnormal findings on diagnostic imaging of heart and coronary circulation: Secondary | ICD-10-CM

## 2021-11-16 DIAGNOSIS — E7800 Pure hypercholesterolemia, unspecified: Secondary | ICD-10-CM

## 2021-11-16 NOTE — Progress Notes (Signed)
Primary Physician/Referring:  [name], MD  Patient ID: Sharon Daniels, female    DOB: 11/24/1950, 71 y.o.   MRN: [id number]  Chief Complaint  Patient presents with   DOE   Coronary Artery Disease   Follow-up    1 year   HPI:    Sharon Daniels  is a 71 y.o.  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 Dyspnea being followed by pulmonary medicine and is being treated for emphysema.  She denies any leg edema, no dizziness or syncope.  She is tolerating all medications well including increased dose of metoprolol succinate and Crestor without any side effects. Past Medical History:  Diagnosis Date   Acute pulmonary embolism ([place]) 06/13/2013   CT angio chest 06/12/13   ADHD    Alcohol abuse    Allergy    Anemia    Anxiety    Arthritis    Back pain    Complication of anesthesia    "during breast surgery: could hear the surgery, wake up too soon, anxious   Constipation    COPD (chronic obstructive pulmonary disease) ([place])    Depression    hx of   DVT (deep venous thrombosis) ([place]) 2015   Fatty liver    Frequent sinus infections    GERD (gastroesophageal reflux disease)    H/O bronchitis    Hard of hearing    High cholesterol    History of kidney stones    x3   Hypertension    Joint pain    Osteoporosis    Pneumonia    hx of 10 years ago   PONV (postoperative nausea and vomiting)    "mild nausea"   Post-menopausal    Prediabetes    Stress incontinence    Substance abuse ([place])    Vitamin D deficiency    Wears glasses    Past Surgical History:  Procedure Laterality Date   ABDOMINOPLASTY  09/2015   BREAST REDUCTION SURGERY  08/04/2015   BREAST REDUCTION SURGERY Bilateral 08/04/2015    Procedure: MAMMARY REDUCTION  (BREAST);  Surgeon: Crissie Reese, MD;  Location: [facility];  Service: Plastics;  Laterality: Bilateral;   BREAST SURGERY  1985   abscess   CARPAL TUNNEL RELEASE Left    CESAREAN SECTION  1989, [place]  2013   in the office   COSMETIC SURGERY     ear lobes shorten   CYSTECTOMY     HAMMERTOE RECONSTRUCTION WITH WEIL OSTEOTOMY Right 06/26/2018   Procedure: Right 2nd toe hammertoe correction; 2nd metatarsal Weil osteotomy;  Surgeon: Wylene Simmer, MD;  Location: [facility];  Service: Orthopedics;  Laterality: Right;  67mn   NASAL SINUS SURGERY  2000   ORIF ANKLE FRACTURE Left 10/28/2018   Procedure: OPEN REDUCTION INTERNAL FIXATION (ORIF) LEFT ANKLE BIMALLEOLAR FRACTURE DISLOCATION;  Surgeon: HWylene Simmer MD;  Location: [facility]  Service: Orthopedics;  Laterality: Left;  952m   TOTAL KNEE ARTHROPLASTY Bilateral 06/05/2013   Procedure: BILATERAL TOTAL KNEE ARTHROPLASTY;  Surgeon: FrGearlean AlfMD;  Location: [facility];  Service: Orthopedics;  Laterality: Bilateral;   Social History   Tobacco Use   Smoking status: Former    Packs/day: 1.00    Years: 30.00  Total pack years: 30.00    Types: Cigarettes    Quit date: 10/19/2017    Years since quitting: 4.0   Smokeless tobacco: Never   Tobacco comments:    Stop Smoking Oct..2019  Substance Use Topics   Alcohol use: Not Currently   Marital Status: Divorced  ROS  Review of Systems  Cardiovascular:  Positive for dyspnea on exertion. Negative for chest pain and leg swelling.  Gastrointestinal:  Negative for melena.   Objective  Blood pressure 129/78, pulse 64, temperature 98.5 F (36.9 C), temperature source Temporal, resp. rate 16, height _0  (1.702 m), weight 170 lb 9.6 oz (77.4 kg), SpO2 97 %. Body mass index is 26.72 kg/m.     11/16/2021   10:08 AM 04/03/2021   12:00 PM 01/31/2021   11:00 AM  Vitals with BMI  Height _1  _2  _3   Weight 170 lbs 10  oz 175 lbs 180 lbs  BMI 26.71 40.3 47.42  Systolic 595 638 756  Diastolic 78 73 68  Pulse 64 70 80     Physical Exam Vitals reviewed.  Neck:     Vascular: No carotid bruit or JVD.  Cardiovascular:     Rate and Rhythm: Normal rate and regular rhythm.     Pulses: Intact distal pulses.     Heart sounds: Normal heart sounds, S1 normal and S2 normal. No murmur heard.    No gallop.  Pulmonary:     Effort: Pulmonary effort is normal.     Breath sounds: Examination of the right-lower field reveals rhonchi. Examination of the left-lower field reveals rhonchi. Rhonchi present.  Musculoskeletal:     Right lower leg: No edema.     Left lower leg: No edema.     Laboratory examination:   Lab Results  Component Value Date   NA 141 08/09/2020   K 4.9 08/09/2020   CO2 25 08/09/2020   GLUCOSE 109 (H) 08/09/2020   BUN 15 08/09/2020   CREATININE 0.83 08/09/2020   CALCIUM 10.1 08/09/2020   EGFR 77 08/09/2020   GFRNONAA >60 10/20/2018       Latest Ref Rng & Units 08/09/2020    9:42 AM 03/24/2020    1:39 PM 03/24/2019    2:58 PM  CMP  Glucose 65 - 99 mg/dL 109     BUN 8 - 27 mg/dL 15     Creatinine 0.57 - 1.00 mg/dL 0.83     Sodium 134 - 144 mmol/L 141     Potassium 3.5 - 5.2 mmol/L 4.9     Chloride 96 - 106 mmol/L 102     CO2 20 - 29 mmol/L 25     Calcium 8.7 - 10.3 mg/dL 10.1  9.8  10.4   Total Protein 6.0 - 8.5 g/dL 7.2     Total Bilirubin 0.0 - 1.2 mg/dL 0.3     Alkaline Phos 44 - 121 IU/L 69     AST 0 - 40 IU/L 26     ALT 0 - 32 IU/L 24         Latest Ref Rng & Units 08/09/2020    9:42 AM 10/20/2018   12:45 PM 07/27/2015    1:42 PM  CBC  WBC 3.4 - 10.8 x10E3/uL 6.5  11.2  7.6   Hemoglobin 11.1 - 15.9 g/dL 12.3  10.9  12.1   Hematocrit 34.0 - 46.6 % 37.8  33.9  38.1   Platelets 150 - 450 x10E3/uL 242  235  289   Lipid Panel     Component Value Date/Time   CHOL 146 08/09/2020 0942   TRIG 79 08/09/2020 0942   HDL 61 08/09/2020 0942   CHOLHDL 2.4 08/09/2020 0942   LDLCALC  70 08/09/2020 0942   LABVLDL 15 08/09/2020 0942    HEMOGLOBIN A1C Lab Results  Component Value Date   HGBA1C 6.0 (H) 08/09/2020   External labs:   Labs 08/25/2021:  Serum glucose 94 mg, sodium 142, potassium 4.3, BUN 17, creatinine 1.0, EGFR 60 mL, LFTs normal.  Total cholesterol 115, triglycerides 117, HDL 59, LDL 33.  A1c 6.0%.  TSH 1.70, normal.    Cholesterol, total 151.000 M 07/16/2019 HDL 53.000 MG 07/16/2019 LDL 85.000 MG 07/16/2019 Triglycerides 67.000 MG 07/16/2019  Medications and allergies   Allergies  Allergen Reactions   Morphine Itching, Anxiety and Rash    IV only, made her mean     MEDICATIONS   Current Outpatient Medications:    albuterol (VENTOLIN HFA) 108 (90 Base) MCG/ACT inhaler, Inhale 2 puffs into the lungs every 4 (four) to 6 (six) hours as needed., Disp: 18 g, Rfl: 0   Alum Hydroxide-Mag Carbonate (GAVISCON PO), Take 1-3 tablets by mouth as needed (heartburn)., Disp: , Rfl:    Ascorbic Acid (VITAMIN C) 500 MG CAPS, Take 1 capsule by mouth daily., Disp: , Rfl:    aspirin 81 MG EC tablet, Take 81 mg by mouth daily. Swallow whole., Disp: , Rfl:    atomoxetine (STRATTERA) 80 MG capsule, Take 1 capsule (80 mg total) by mouth daily in the morning, Disp: 90 capsule, Rfl: 1   azelastine (ASTELIN) 0.1 % nasal spray, Place 2 sprays into both nostrils 2 (two) times daily., Disp: 30 mL, Rfl: 0   BLACK COHOSH PO, Take 1 capsule by mouth daily., Disp: , Rfl:    buPROPion (WELLBUTRIN XL) 300 MG 24 hr tablet, Take 1 tablet (300 mg total) by mouth in the morning., Disp: 90 tablet, Rfl: 3   Cholecalciferol (VITAMIN D3) 1000 UNITS CAPS, Take 2,000 Units by mouth daily. Increases to 2000 U 11/16/21, Disp: , Rfl:    denosumab (PROLIA) 60 MG/ML SOLN injection, Inject 60 mg into the skin once. Administer in upper arm, thigh, or abdomen (Patient taking differently: Inject 60 mg into the skin every 6 (six) months. Administer in upper arm, thigh, or abdomen), Disp: 1 Syringe, Rfl: 0    diclofenac Sodium (VOLTAREN) 1 % GEL, Apply 4 g topically to affected area 4 (four) times daily., Disp: 500 g, Rfl: 11   Insulin Pen Needle 31G X 6 MM MISC, Use one pen needle to inject victoza once daily., Disp: 100 each, Rfl: 0   Insulin Pen Needle 32G X 4 MM MISC, Use to administer Victoza, Disp: 100 each, Rfl: 0   liraglutide (VICTOZA) 18 MG/3ML SOPN, Inject 1.8 mg into the skin daily., Disp: 9 mL, Rfl: 1   metoprolol succinate (TOPROL-XL) 50 MG 24 hr tablet, Take 2 tablets (100 mg total) by mouth daily. Take with or immediately following a meal., Disp: 180 tablet, Rfl: 2   montelukast (SINGULAIR) 10 MG tablet, Take 1 tablet (10 mg total) by mouth every evening., Disp: 90 tablet, Rfl: 3   Multiple Vitamin (MULTIVITAMIN WITH MINERALS) TABS tablet, Take 1 tablet by mouth daily., Disp: , Rfl:    nitroGLYCERIN (NITROSTAT) 0.4 MG SL tablet, Take 1 tablet sublingually if needed for chest pain, repeat in 5 minutes if needed and call EMS, Disp: 25 tablet, Rfl: 0  omeprazole (PRILOSEC) 40 MG capsule, Take 40 mg by mouth daily as needed (for acid reflux or heartburn). Reported on 04/21/2015, Disp: , Rfl:    Prasterone (INTRAROSA) 6.5 MG INST, Place 6.5 mg vaginally at bedtime., Disp: 28 each, Rfl: 11   sertraline (ZOLOFT) 100 MG tablet, Take 1.5 tablets (150 mg total) by mouth daily., Disp: 145 tablet, Rfl: 1   umeclidinium-vilanterol (ANORO ELLIPTA) 62.5-25 MCG/ACT AEPB, Inhale 1 puff into the lungs daily., Disp: 60 each, Rfl: 3   valsartan (DIOVAN) 160 MG tablet, TAKE 1 AND 1/2 TABLETS BY MOUTH DAILY., Disp: 145 tablet, Rfl: 1   doxycycline (VIBRA-TABS) 100 MG tablet, Take 1 tablet (100 mg total) by mouth every 12 (twelve) hours for 10 days, Disp: 20 tablet, Rfl: 0   rosuvastatin (CRESTOR) 20 MG tablet, Take 1 tablet (20 mg total) by mouth daily. Do not take sat & Sun, Disp: 90 tablet, Rfl: 3   Radiology:   No results found.  Cardiac Studies:   Coronary calcium score 06/21/2020: LM: 50 LAD:  302 LCx: 7 RCA: 322 Total at Agatston score 682.  MESA database percentile 96.  Ascending and descending thoracic aortic measurements normal.  Aortic atherosclerosis.  Visualized lung fields do not suggest any significant abnormality.  PCV ECHOCARDIOGRAM COMPLETE 08/01/2020 Left ventricle cavity is normal in size. Normal left ventricular wall thickness. Normal global wall motion. Doppler evidence of grade I (impaired) diastolic dysfunction, normal LAP. Normal LV systolic function with visual EF 55-60%. Calculated EF 55%. Left atrial cavity is mildly dilated at 4.3 cm. MV appears normal. Normal thickness. Cannot r/o MVP. Moderate to severe anteriorly directed mitral regurgitation. Structurally normal tricuspid valve. No evidence of tricuspid stenosis. Mild tricuspid regurgitation. No evidence of pulmonary hypertension.   PCV MYOCARDIAL PERFUSION WO LEXISCAN 08/01/2020 Exercise nuclear stress test was performed using Bruce protocol. Patient reached 7.8 METS, and 88% of age predicted maximum heart rate. Exercise capacity was fair. No chest pain reported. Heart rate and hemodynamic response were normal. Stress EKG revealed no ischemic changes. SPECT images show decreased tracer uptake in inferior myocardium in rest and stress images, likely due to breast attenuation imaging performed in sitting position. In addition, there is a very small sized mild intensity, reversible perfusion defect in apical inferolateral myocardium. Stress LVEF calculated as 43%, although visually appears normal. Low risk study.  EKG:   EKG 11/16/2021: Normal sinus rhythm at rate of 69 bpm, normal EKG.  No change from 07/08/2020.  Assessment     ICD-10-CM   1. Coronary artery disease involving native coronary artery of native heart with other form of angina pectoris ([place])  I25.118 EKG 12-Lead    2. Agatston CAC score, >400: Total score 682, 96 percentile in [place] 06/21/2020  R93.1     3. Primary hypertension  I10      4. Hypercholesteremia  E78.00 rosuvastatin (CRESTOR) 20 MG tablet      No orders of the defined types were placed in this encounter.  Orders Placed This Encounter  Procedures   EKG 12-Lead    Recommendations:   Sharon Daniels is a 71 y.o. 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 Dyspnea being followed by pulmonary medicine and is being treated for emphysema.  1. Coronary artery disease involving native coronary artery of native heart  with other form of angina pectoris [facility]) Patient with chronic mild dyspnea, no PND or orthopnea, no clinical evidence of heart failure.  She remains without frank chest discomfort or chest pain.  Continue primary prevention.  2. Agatston CAC score, >400: Total score 682, 96 percentile in [place] 06/21/2020 Extremely high coronary calcium score and placing her in the 96 percentile, she has lost about 10 pounds in weight since last office visit, blood pressure is well controlled, all her risk factors are now well controlled.  Continue primary/secondary prevention.  She is on aspirin 81 mg daily, continue the same.  She has had a abnormal nuclear stress test but medical therapy advised for this.  Does not need cardiac catheterization unless she develops symptoms of chest discomfort or worsening dyspnea.  3. Primary hypertension Blood pressure is well controlled on present medical regimen.  4. Hypercholesteremia Reviewed her external labs, lipids under excellent control.  She has developed mild myalgia and leg fatigue and suspect may be related to statins.  Advised her to take Crestor only Monday through Friday and be off on weekends as her LDL is very low.  She will also increase  vitamin D to 2000 units and check levels with her PCP during her routine visits.  Otherwise stable from cardiac standpoint, in the absence of angina, normal EKG, normal physical examination, risk factors well controlled, I will see her back on a as needed basis.  I appreciate Shelbie Ammons, MD for sending the patient to me for evaluation.    Adrian Prows, MD, [facility] 11/16/2021, 10:27 AM Office: [phone] Fax: [phone] Pager: [phone]

## 2021-11-24 ENCOUNTER — Other Ambulatory Visit (HOSPITAL_COMMUNITY): Payer: Self-pay

## 2021-11-24 MED ORDER — VALSARTAN 160 MG PO TABS
240.0000 mg | ORAL_TABLET | Freq: Every day | ORAL | 1 refills | Status: DC
  Filled 2021-11-24: qty 145, 97d supply, fill #0

## 2021-12-06 ENCOUNTER — Other Ambulatory Visit (HOSPITAL_COMMUNITY): Payer: Self-pay

## 2021-12-06 MED ORDER — PRASTERONE 6.5 MG VA INST
6.5000 mg | VAGINAL_INSERT | Freq: Every day | VAGINAL | 11 refills | Status: DC
  Filled 2021-12-06: qty 28, 28d supply, fill #0
  Filled 2022-01-10: qty 28, 28d supply, fill #1
  Filled 2022-02-22: qty 28, 28d supply, fill #2

## 2021-12-07 ENCOUNTER — Telehealth: Payer: Self-pay | Admitting: *Deleted

## 2021-12-07 ENCOUNTER — Other Ambulatory Visit (HOSPITAL_COMMUNITY): Payer: Self-pay

## 2021-12-07 NOTE — Telephone Encounter (Signed)
Patient called c/o urine leakage asked referral could be placed for PT for pelvic floor. I called patient and explained she can see Dr.Lavoie first and if referral needed she can refer to PT. Message sent to appointments to schedule office visit.

## 2021-12-08 ENCOUNTER — Other Ambulatory Visit (HOSPITAL_COMMUNITY): Payer: Self-pay

## 2021-12-11 ENCOUNTER — Encounter: Payer: Self-pay | Admitting: Gastroenterology

## 2021-12-12 ENCOUNTER — Other Ambulatory Visit (HOSPITAL_COMMUNITY): Payer: Self-pay

## 2021-12-12 MED ORDER — VICTOZA 18 MG/3ML ~~LOC~~ SOPN
1.8000 mg | PEN_INJECTOR | Freq: Every day | SUBCUTANEOUS | 0 refills | Status: DC
  Filled 2021-12-12: qty 3, 10d supply, fill #0

## 2021-12-13 ENCOUNTER — Other Ambulatory Visit (HOSPITAL_COMMUNITY): Payer: Self-pay

## 2021-12-13 MED ORDER — VICTOZA 18 MG/3ML ~~LOC~~ SOPN
1.8000 mg | PEN_INJECTOR | Freq: Every day | SUBCUTANEOUS | 1 refills | Status: DC
  Filled 2021-12-13: qty 9, 30d supply, fill #0

## 2021-12-14 ENCOUNTER — Other Ambulatory Visit (HOSPITAL_COMMUNITY): Payer: Self-pay

## 2021-12-15 ENCOUNTER — Other Ambulatory Visit (HOSPITAL_COMMUNITY): Payer: Self-pay

## 2021-12-15 ENCOUNTER — Ambulatory Visit (INDEPENDENT_AMBULATORY_CARE_PROVIDER_SITE_OTHER): Payer: Medicare Other | Admitting: Obstetrics & Gynecology

## 2021-12-15 ENCOUNTER — Encounter: Payer: Self-pay | Admitting: Obstetrics & Gynecology

## 2021-12-15 VITALS — BP 112/70 | HR 78

## 2021-12-15 DIAGNOSIS — N3946 Mixed incontinence: Secondary | ICD-10-CM

## 2021-12-15 NOTE — Progress Notes (Signed)
    YENNIFER SEGOVIA July 09, 1950 [id number]        71 y.o.  [id number]   RP: Progressively worsening urinary leakage  HPI: Progressively worsening urinary leakage.  Tends to leak especially when the bladder is full.  Both urgency and urinary leakage with physical activities, coughing or sneezing.  No dysuria.  No fever.  BMs normal.   OB History  Gravida Para Term Preterm AB Living  '3 2 2   1 2  '$ SAB IAB Ectopic Multiple Live Births  1            # Outcome Date GA Lbr Len/2nd Weight Sex Delivery Anes PTL Lv  3 SAB           2 Term           1 Term             Past medical history,surgical history, problem list, medications, allergies, family history and social history were all reviewed and documented in the EPIC chart.   Directed ROS with pertinent positives and negatives documented in the history of present illness/assessment and plan.  Exam:  Vitals:   12/15/21 1431  BP: 112/70  Pulse: 78  SpO2: 97%   General appearance:  Normal  Abdomen: Normal  Gynecologic exam: Vulva normal.  Bimanual exam:  Uterus AV, normal volume, mobile, NT.  No adnexal mass, NT.  No uterine prolapse, no cysto-rectocele.    U/A: Dark yellow, cloudy, Nit Neg, Pro Neg, WBC 0-5, RBC 3-10, Bacteria Few.  U. Culture pending.   Assessment/Plan:  71 y.o. [id number]   1. Mixed stress and urge urinary incontinence Progressively worsening urinary leakage.  Tends to leak especially when the bladder is full.  Both urgency and urinary leakage with physical activities, coughing or sneezing.  No dysuria.  No fever.  BMs normal. Very mild abnormalities on U/A.  Will wait on U. Culture.  Kegels recommended and instructed.  Will refer to PT for Pelvic Floor reinforcement. - Urinalysis,Complete w/RFL Culture   Princess Bruins MD, 3:03 PM 12/15/2021

## 2021-12-17 LAB — URINALYSIS, COMPLETE W/RFL CULTURE
Bilirubin Urine: NEGATIVE
Glucose, UA: NEGATIVE
Hyaline Cast: NONE SEEN /LPF
Ketones, ur: NEGATIVE
Leukocyte Esterase: NEGATIVE
Nitrites, Initial: NEGATIVE
Protein, ur: NEGATIVE
Specific Gravity, Urine: 1.025 (ref 1.001–1.035)
pH: 5.5 (ref 5.0–8.0)

## 2021-12-17 LAB — CULTURE INDICATED

## 2021-12-17 LAB — URINE CULTURE
MICRO NUMBER:: 14290057
Result:: NO GROWTH
SPECIMEN QUALITY:: ADEQUATE

## 2021-12-18 ENCOUNTER — Other Ambulatory Visit: Payer: Self-pay

## 2021-12-18 ENCOUNTER — Other Ambulatory Visit (HOSPITAL_COMMUNITY): Payer: Self-pay

## 2021-12-18 MED ORDER — VALSARTAN 160 MG PO TABS
240.0000 mg | ORAL_TABLET | Freq: Every day | ORAL | 1 refills | Status: DC
  Filled 2021-12-18: qty 135, 90d supply, fill #0
  Filled 2022-03-06: qty 135, 90d supply, fill #1

## 2021-12-22 ENCOUNTER — Other Ambulatory Visit: Payer: Self-pay

## 2021-12-24 ENCOUNTER — Encounter: Payer: Self-pay | Admitting: Obstetrics & Gynecology

## 2021-12-28 ENCOUNTER — Telehealth: Payer: Self-pay | Admitting: *Deleted

## 2021-12-28 NOTE — Telephone Encounter (Signed)
[employer] insurance verification has been sent awaiting Summary of benefits  

## 2022-01-09 ENCOUNTER — Other Ambulatory Visit: Payer: Self-pay

## 2022-01-09 ENCOUNTER — Other Ambulatory Visit (HOSPITAL_COMMUNITY): Payer: Self-pay

## 2022-01-09 MED ORDER — VICTOZA 18 MG/3ML ~~LOC~~ SOPN
1.8000 mg | PEN_INJECTOR | Freq: Every day | SUBCUTANEOUS | 0 refills | Status: DC
  Filled 2022-01-09 (×3): qty 27, 90d supply, fill #0

## 2022-01-09 MED ORDER — TECHLITE PEN NEEDLES 31G X 6 MM MISC
0 refills | Status: AC
  Filled 2022-01-09: qty 100, 90d supply, fill #0

## 2022-01-10 ENCOUNTER — Other Ambulatory Visit (HOSPITAL_COMMUNITY): Payer: Self-pay

## 2022-01-15 ENCOUNTER — Ambulatory Visit: Payer: BLUE CROSS/BLUE SHIELD | Admitting: Gastroenterology

## 2022-01-15 ENCOUNTER — Encounter: Payer: Self-pay | Admitting: Gastroenterology

## 2022-01-15 VITALS — BP 136/76 | HR 70 | Ht 66.0 in | Wt 169.5 lb

## 2022-01-15 DIAGNOSIS — Z860100 Personal history of colon polyps, unspecified: Secondary | ICD-10-CM | POA: Insufficient documentation

## 2022-01-15 DIAGNOSIS — R1310 Dysphagia, unspecified: Secondary | ICD-10-CM

## 2022-01-15 DIAGNOSIS — K219 Gastro-esophageal reflux disease without esophagitis: Secondary | ICD-10-CM | POA: Insufficient documentation

## 2022-01-15 NOTE — Patient Instructions (Signed)
You have been scheduled for a colonoscopy. Please follow written instructions given to you at your visit today.  Please pick up your prep supplies at the pharmacy within the next 1-3 days. If you use inhalers (even only as needed), please bring them with you on the day of your procedure.  _______________________________________________________  If you are age 71 or older, your body mass index should be between 23-30. Your Body mass index is 27.36 kg/m. If this is out of the aforementioned range listed, please consider follow up with your Primary Care Provider.  If you are age 59 or younger, your body mass index should be between 19-25. Your Body mass index is 27.36 kg/m. If this is out of the aformentioned range listed, please consider follow up with your Primary Care Provider.   ________________________________________________________  The [facility] providers would like to encourage you to use [facility] to communicate with providers for non-urgent requests or questions.  Due to long hold times on the telephone, sending your provider a message by [facility] may be a faster and more efficient way to get a response.  Please allow 48 business hours for a response.  Please remember that this is for non-urgent requests.  _______________________________________________________

## 2022-01-15 NOTE — Progress Notes (Signed)
01/15/2022 Sharon Daniels [id number] [birth year]   HISTORY OF PRESENT ILLNESS: This is a 72 year old female who is a patient Dr. Woodward Ku.  She is here today with complaints of acid reflux and trouble swallowing.  She says that this has all been going on for about 5 to 7 years, but has increased in frequency.  She said that it was occurring about 2 or 3 times a year that she would have episodes where food would get stuck while eating.  Now it has happening about 5 times a month.  She is on omeprazole 40 mg twice daily.  She says that after these episodes occur she gets a lot of mucus or phlegm buildup that she brings up.  She chews Gaviscon when these episodes happen as well.  No issues with swallowing liquids alone.  She had a colonoscopy in August 2021 at which time the prep was inadequate so was recommended to have a repeat in a year.  She did have both hyperplastic and adenomatous polyps removed at that time.  Denies any changes in her bowel habits or rectal bleeding.  Past Medical History:  Diagnosis Date   Acute pulmonary embolism ([place]) 06/13/2013   CT angio chest 06/12/13   ADHD    Alcohol abuse    Allergy    Anemia    Anxiety    Arthritis    Back pain    Complication of anesthesia    "during breast surgery: could hear the surgery, wake up too soon, anxious   Constipation    COPD (chronic obstructive pulmonary disease) ([place])    Depression    hx of   DVT (deep venous thrombosis) ([place]) 2015   Fatty liver    Frequent sinus infections    GERD (gastroesophageal reflux disease)    H/O bronchitis    Hard of hearing    High cholesterol    History of kidney stones    x3   Hypertension    Joint pain    Osteoporosis    Pneumonia    hx of 10 years ago   PONV (postoperative nausea and vomiting)    "mild nausea"   Post-menopausal    Prediabetes    Stress incontinence    Substance abuse ([place])    Vitamin D deficiency    Wears glasses    Past Surgical History:   Procedure Laterality Date   ABDOMINOPLASTY  09/2015   BREAST REDUCTION SURGERY  08/04/2015   BREAST REDUCTION SURGERY Bilateral 08/04/2015   Procedure: MAMMARY REDUCTION  (BREAST);  Surgeon: Crissie Reese, MD;  Location: [facility];  Service: Plastics;  Laterality: Bilateral;   BREAST SURGERY  1985   abscess   CARPAL TUNNEL RELEASE Left    CESAREAN SECTION  1989, [place]  2013   in the office   COSMETIC SURGERY     ear lobes shorten   CYSTECTOMY     HAMMERTOE RECONSTRUCTION WITH WEIL OSTEOTOMY Right 06/26/2018   Procedure: Right 2nd toe hammertoe correction; 2nd metatarsal Weil osteotomy;  Surgeon: Wylene Simmer, MD;  Location: [facility];  Service: Orthopedics;  Laterality: Right;  72mn   NASAL SINUS SURGERY  2000   ORIF ANKLE FRACTURE Left 10/28/2018   Procedure: OPEN REDUCTION INTERNAL FIXATION (ORIF) LEFT ANKLE BIMALLEOLAR FRACTURE DISLOCATION;  Surgeon: HWylene Simmer MD;  Location[place] [facility]  Service: Orthopedics;  Laterality: Left;  965m   TOTAL KNEE ARTHROPLASTY Bilateral 06/05/2013  Procedure: BILATERAL TOTAL KNEE ARTHROPLASTY;  Surgeon: Gearlean Alf, MD;  Location: [facility];  Service: Orthopedics;  Laterality: Bilateral;    reports that she quit smoking about 4 years ago. Her smoking use included cigarettes. She has a 30.00 pack-year smoking history. She has never used smokeless tobacco. She reports that she does not currently use alcohol. She reports that she does not use drugs. family history includes Alcohol abuse in her mother; Atrial fibrillation in her mother; Breast cancer in her mother; Cancer in her mother; Depression in her mother; Heart attack in her father; Heart disease in her father and mother; Hyperlipidemia in her father and mother; Hypertension in her father and mother; Kidney disease in her mother; Ovarian cancer in her mother. Allergies  Allergen Reactions   Morphine Itching, Anxiety and Rash    IV only, made her mean       Outpatient Encounter Medications as of 01/15/2022  Medication Sig   albuterol (VENTOLIN HFA) 108 (90 Base) MCG/ACT inhaler Inhale 2 puffs into the lungs every 4 (four) to 6 (six) hours as needed.   Alum Hydroxide-Mag Carbonate (GAVISCON PO) Take 1-3 tablets by mouth as needed (heartburn).   Ascorbic Acid (VITAMIN C) 500 MG CAPS Take 1 capsule by mouth daily.   aspirin 81 MG EC tablet Take 81 mg by mouth daily. Swallow whole.   atomoxetine (STRATTERA) 80 MG capsule Take 1 capsule (80 mg total) by mouth daily in the morning   azelastine (ASTELIN) 0.1 % nasal spray Place 2 sprays into both nostrils 2 (two) times daily.   BLACK COHOSH PO Take 1 capsule by mouth daily.   buPROPion (WELLBUTRIN XL) 300 MG 24 hr tablet Take 1 tablet (300 mg total) by mouth in the morning.   Cholecalciferol (VITAMIN D3) 1000 UNITS CAPS Take 2,000 Units by mouth daily. Increases to 2000 U 11/16/21   diclofenac Sodium (VOLTAREN) 1 % GEL Apply 4 g topically to affected area 4 (four) times daily.   Insulin Pen Needle (TECHLITE PEN NEEDLES) 31G X 6 MM MISC Use a pen needle to inject Victoza under the skin once daily   liraglutide (VICTOZA) 18 MG/3ML SOPN Inject 1.8 mg into the skin daily.   metoprolol succinate (TOPROL-XL) 50 MG 24 hr tablet Take 2 tablets (100 mg total) by mouth daily. Take with or immediately following a meal.   montelukast (SINGULAIR) 10 MG tablet Take 1 tablet (10 mg total) by mouth every evening.   Multiple Vitamin (MULTIVITAMIN WITH MINERALS) TABS tablet Take 1 tablet by mouth daily.   omeprazole (PRILOSEC) 40 MG capsule Take 40 mg by mouth daily as needed (for acid reflux or heartburn). Reported on 04/21/2015   Prasterone (INTRAROSA) 6.5 MG INST Place 6.5 mg (1 insert) vaginally at bedtime.   rosuvastatin (CRESTOR) 20 MG tablet Take 1 tablet (20 mg total) by mouth daily. Do not take sat & Sun   sertraline (ZOLOFT) 100 MG tablet Take 1.5 tablets (150 mg total) by mouth daily.    umeclidinium-vilanterol (ANORO ELLIPTA) 62.5-25 MCG/ACT AEPB Inhale 1 puff into the lungs daily.   valsartan (DIOVAN) 160 MG tablet Take 1.5 tablets (240 mg total) by mouth daily.   denosumab (PROLIA) 60 MG/ML SOLN injection Inject 60 mg into the skin once. Administer in upper arm, thigh, or abdomen (Patient taking differently: Inject 60 mg into the skin every 6 (six) months. Administer in upper arm, thigh, or abdomen)   [DISCONTINUED] Insulin Pen Needle 32G X 4 MM MISC Use to administer  Victoza   [DISCONTINUED] liraglutide (VICTOZA) 18 MG/3ML SOPN Inject 1.8 mg into the skin daily. (Patient not taking: Reported on 12/15/2021)   [DISCONTINUED] liraglutide (VICTOZA) 18 MG/3ML SOPN Inject 1.8 mg into the skin daily. (Patient not taking: Reported on 12/15/2021)   [DISCONTINUED] rosuvastatin (CRESTOR) 20 MG tablet Take 1 tablet (20 mg total) by mouth daily.   [DISCONTINUED] valsartan (DIOVAN) 160 MG tablet TAKE 1 AND 1/2 TABLETS BY MOUTH DAILY.   No facility-administered encounter medications on file as of 01/15/2022.     REVIEW OF SYSTEMS  : All other systems reviewed and negative except where noted in the History of Present Illness.   PHYSICAL EXAM: BP 136/76 (BP Location: Left Arm, Patient Position: Sitting, Cuff Size: Normal)   Pulse 70   Ht '5\' 6"'$  (1.676 m)   Wt 169 lb 8 oz (76.9 kg)   LMP  (LMP Unknown)   SpO2 96%   BMI 27.36 kg/m  General: Well developed white female in no acute distress Head: Normocephalic and atraumatic Eyes:  Sclerae anicteric, conjunctiva pink. Ears: Normal auditory acuity Lungs: Clear throughout to auscultation Heart: Regular rate and rhythm Abdomen: Soft, nontender, non distended. No masses or hepatomegaly noted. Normal bowel sounds Rectal:  Will be done at the time of colonoscopy. Musculoskeletal: Symmetrical with no gross deformities  Skin: No lesions on visible extremities Extremities: No edema  Neurological: Alert oriented x 4, grossly  nonfocal Psychological:  Alert and cooperative. Normal mood and affect  ASSESSMENT AND PLAN: *Dysphagia: Complains of episodes of dysphagia occurring for years, but initially used to be 2 or 3 times a year and now she is saying maybe 5 times a month that food is getting stuck.  Will plan for EGD with possible dilation with Dr. Silverio Decamp.  She is on omeprazole 40 mg twice daily so we will continue that for now. *History of colon polyps: Had both adenomatous and hyperplastic colon polyps removed on colonoscopy August 2021.  Prep was inadequate so was recommended she have another in a year.  Will schedule that with Dr. Silverio Decamp with a 2-day bowel prep.  **The risks, benefits, and alternatives to EGD with dilation and colonoscopy were discussed with the patient and she consents to proceed.   CC:  [name], MD

## 2022-01-16 ENCOUNTER — Other Ambulatory Visit (HOSPITAL_COMMUNITY): Payer: Self-pay

## 2022-01-16 MED ORDER — AZITHROMYCIN 250 MG PO TABS
500.0000 mg | ORAL_TABLET | Freq: Once | ORAL | 0 refills | Status: AC
  Filled 2022-01-16: qty 2, 1d supply, fill #0

## 2022-01-18 ENCOUNTER — Other Ambulatory Visit (HOSPITAL_COMMUNITY): Payer: Self-pay

## 2022-01-18 MED ORDER — AMOXICILLIN-POT CLAVULANATE 875-125 MG PO TABS
1.0000 | ORAL_TABLET | Freq: Two times a day (BID) | ORAL | 0 refills | Status: DC
  Filled 2022-01-18: qty 20, 10d supply, fill #0

## 2022-01-18 MED ORDER — ALBUTEROL SULFATE HFA 108 (90 BASE) MCG/ACT IN AERS
2.0000 | INHALATION_SPRAY | RESPIRATORY_TRACT | 2 refills | Status: DC
  Filled 2022-01-18: qty 6.7, 25d supply, fill #0
  Filled 2022-10-27: qty 6.7, 25d supply, fill #1

## 2022-01-18 MED ORDER — PREDNISONE 50 MG PO TABS
50.0000 mg | ORAL_TABLET | Freq: Every day | ORAL | 0 refills | Status: DC
  Filled 2022-01-18: qty 5, 5d supply, fill #0

## 2022-01-19 ENCOUNTER — Other Ambulatory Visit (HOSPITAL_COMMUNITY): Payer: Self-pay

## 2022-01-22 ENCOUNTER — Other Ambulatory Visit (HOSPITAL_COMMUNITY): Payer: Self-pay

## 2022-01-29 ENCOUNTER — Other Ambulatory Visit (HOSPITAL_COMMUNITY): Payer: Self-pay

## 2022-02-06 NOTE — Telephone Encounter (Signed)
New insurance information given. Prolia insurance verification has been sent awaiting Summary of benefits

## 2022-02-08 ENCOUNTER — Encounter: Payer: Self-pay | Admitting: Obstetrics & Gynecology

## 2022-02-08 DIAGNOSIS — N3946 Mixed incontinence: Secondary | ICD-10-CM

## 2022-02-08 NOTE — Telephone Encounter (Signed)
Call placed to patient.  Left message to call Sharee Pimple, RN at [facility], [phone], OPT 5.

## 2022-02-09 NOTE — Telephone Encounter (Signed)
Deductible   OOP MAX  Annual exam  Calcium             Date  Upcoming dental procedures   Is Prior Authorization needed   Pt estimated Cost       Coverage Details:

## 2022-02-12 NOTE — Telephone Encounter (Signed)
Spoke with patient.  Reviewed options for PT.  Patient is going to check with insurance and return call to advise where she would like the referral to be sent.  Questions answered.

## 2022-02-14 NOTE — Telephone Encounter (Signed)
Referral placed to [facility].   MyChart message to patient.   Routing to provider for final review. Patient is agreeable to disposition. Will close encounter.  Cc: Wyatt Portela

## 2022-02-20 ENCOUNTER — Encounter: Payer: Self-pay | Admitting: Gastroenterology

## 2022-02-21 NOTE — Telephone Encounter (Signed)
Per review of Epic, patient is scheduled for 03/27/22.   Encounter closed.

## 2022-02-22 ENCOUNTER — Other Ambulatory Visit (HOSPITAL_COMMUNITY): Payer: Self-pay

## 2022-02-22 NOTE — Telephone Encounter (Signed)
--   Message -----  From: Princess Bruins, MD  Sent: 02/21/2022   3:06 PM EST  To: Burnice Logan, RN  Subject: RE: Prolia                                     Agree with Lab appointment for Ca++, then Aex.  Dr Dellis Filbert  ----- Message -----  From: Burnice Logan, RN  Sent: 02/13/2022   2:23 PM EST  To: Princess Bruins, MD; Enrigue Catena, RMA  Subject: Prolia                                         Dr. Dellis Filbert,   AEX with labs 06/17/2020   BMD 07/05/20, repeat 2 yrs   Last prolia received 07/04/21   Problem OV 12/15/21   Next AEX scheduled 06/19/22.    Patient will need updated calcium if not completed at PCP.  Per EPIC, patient has not had a medication f/u for prolia.  Please advise if OV needed or ok to schedule lab appt prior to AEX?   Thanks,  Sharee Pimple

## 2022-02-22 NOTE — Telephone Encounter (Signed)
Per Provider we will wait per protocol. Pt needs annual exam before proceeding with Prolia. When pt comes in for her appt if her labs are normal can proceed with Prolia. Encounter closed

## 2022-02-23 ENCOUNTER — Other Ambulatory Visit (HOSPITAL_COMMUNITY): Payer: Self-pay

## 2022-02-23 ENCOUNTER — Other Ambulatory Visit: Payer: Self-pay

## 2022-02-27 ENCOUNTER — Encounter: Payer: Self-pay | Admitting: Certified Registered Nurse Anesthetist

## 2022-02-28 ENCOUNTER — Ambulatory Visit (AMBULATORY_SURGERY_CENTER): Payer: Medicare Other | Admitting: Gastroenterology

## 2022-02-28 ENCOUNTER — Encounter: Payer: Self-pay | Admitting: Gastroenterology

## 2022-02-28 VITALS — BP 117/71 | HR 73 | Temp 97.3°F | Resp 21 | Ht 66.0 in | Wt 169.0 lb

## 2022-02-28 DIAGNOSIS — Z538 Procedure and treatment not carried out for other reasons: Secondary | ICD-10-CM | POA: Diagnosis not present

## 2022-02-28 DIAGNOSIS — Z86010 Personal history of colonic polyps: Secondary | ICD-10-CM | POA: Diagnosis not present

## 2022-02-28 DIAGNOSIS — K219 Gastro-esophageal reflux disease without esophagitis: Secondary | ICD-10-CM

## 2022-02-28 DIAGNOSIS — R1310 Dysphagia, unspecified: Secondary | ICD-10-CM

## 2022-02-28 DIAGNOSIS — K31819 Angiodysplasia of stomach and duodenum without bleeding: Secondary | ICD-10-CM

## 2022-02-28 DIAGNOSIS — K2210 Ulcer of esophagus without bleeding: Secondary | ICD-10-CM | POA: Diagnosis not present

## 2022-02-28 DIAGNOSIS — K319 Disease of stomach and duodenum, unspecified: Secondary | ICD-10-CM

## 2022-02-28 DIAGNOSIS — Z09 Encounter for follow-up examination after completed treatment for conditions other than malignant neoplasm: Secondary | ICD-10-CM | POA: Diagnosis present

## 2022-02-28 MED ORDER — SODIUM CHLORIDE 0.9 % IV SOLN: 500.0000 mL | INTRAVENOUS | Status: DC

## 2022-02-28 NOTE — Progress Notes (Signed)
Called to room to assist during endoscopic procedure.  Patient ID and intended procedure confirmed with present staff. Received instructions for my participation in the procedure from the performing physician.  

## 2022-02-28 NOTE — Progress Notes (Signed)
1337 Robinul 0.1 mg IV given due large amount of secretions upon assessment.  MD made aware, vss

## 2022-02-28 NOTE — Progress Notes (Signed)
Report given to PACU, vss 

## 2022-02-28 NOTE — Patient Instructions (Signed)
Thank you for coming in to see Sharon Daniels today. Resume your normal diet and medications today. Return to regular activities tomorrow.  Use Prilosec 40 mg by mouth twice per day for  before breakfast and dinner for 3 months You may reduce this to once per day before a meal effective  MAY 21,2024.   Repeat colonoscopy is scheduled for Tuesday March 12 with an arrival time of 10:30 am.  We will call you one week before on Tuesday March 5 @ 11:30 am to discuss your diet and review prep instructions.  You  are being given a copy of these instructions.        YOU HAD AN ENDOSCOPIC PROCEDURE TODAY AT [facility]:   Refer to the procedure report that was given to you for any specific questions about what was found during the examination.  If the procedure report does not answer your questions, please call your gastroenterologist to clarify.  If you requested that your care partner not be given the details of your procedure findings, then the procedure report has been included in a sealed envelope for you to review at your convenience later.  YOU SHOULD EXPECT: Some feelings of bloating in the abdomen. Passage of more gas than usual.  Walking can help get rid of the air that was put into your GI tract during the procedure and reduce the bloating. If you had a lower endoscopy (such as a colonoscopy or flexible sigmoidoscopy) you may notice spotting of blood in your stool or on the toilet paper. If you underwent a bowel prep for your procedure, you may not have a normal bowel movement for a few days.  Please Note:  You might notice some irritation and congestion in your nose or some drainage.  This is from the oxygen used during your procedure.  There is no need for concern and it should clear up in a day or so.  SYMPTOMS TO REPORT IMMEDIATELY:  Following lower endoscopy (colonoscopy or flexible sigmoidoscopy):  Excessive amounts of blood in the stool  Significant tenderness or worsening of  abdominal pains  Swelling of the abdomen that is new, acute  Fever of 100F or higher  Following upper endoscopy (EGD)  Vomiting of blood or coffee ground material  New chest pain or pain under the shoulder blades  Painful or persistently difficult swallowing  New shortness of breath  Fever of 100F or higher  Black, tarry-looking stools  For urgent or emergent issues, a gastroenterologist can be reached at any hour by calling [phone]. Do not use MyChart messaging for urgent concerns.    DIET:  We do recommend a small meal at first, but then you may proceed to your regular diet.  Drink plenty of fluids but you should avoid alcoholic beverages for 24 hours.  ACTIVITY:  You should plan to take it easy for the rest of today and you should NOT DRIVE or use heavy machinery until tomorrow (because of the sedation medicines used during the test).    FOLLOW UP: Our staff will call the number listed on your records the next business day following your procedure.  We will call around 7:15- 8:00 am to check on you and address any questions or concerns that you may have regarding the information given to you following your procedure. If we do not reach you, we will leave a message.     If any biopsies were taken you will be contacted by phone or by letter within  the next 1-3 weeks.  Please call Sharon Daniels at [phone] if you have not heard about the biopsies in 3 weeks.    SIGNATURES/CONFIDENTIALITY: You and/or your care partner have signed paperwork which will be entered into your electronic medical record.  These signatures attest to the fact that that the information above on your After Visit Summary has been reviewed and is understood.  Full responsibility of the confidentiality of this discharge information lies with you and/or your care-partner.

## 2022-02-28 NOTE — Progress Notes (Signed)
[facility] History and Physical   Primary Care Physician:  [name], MD   Reason for Procedure:  Dysphagia, h/o colon polyps  Plan:    EGD and colonoscopy with possible interventions as needed     HPI: Sharon Daniels is a very pleasant 72 y.o. female here for EGD and colonoscopy for dysphagia, GERD and h/o colon polyps.   The risks and benefits as well as alternatives of endoscopic procedure(s) have been discussed and reviewed. All questions answered. The patient agrees to proceed.    Past Medical History:  Diagnosis Date   Acute pulmonary embolism ([place]) 06/13/2013   CT angio chest 06/12/13   ADHD    Alcohol abuse    Allergy    Anemia    Anxiety    Arthritis    Back pain    Complication of anesthesia    "during breast surgery: could hear the surgery, wake up too soon, anxious   Constipation    COPD (chronic obstructive pulmonary disease) ([place])    Depression    hx of   DVT (deep venous thrombosis) ([place]) 2015   Fatty liver    Frequent sinus infections    GERD (gastroesophageal reflux disease)    H/O bronchitis    Hard of hearing    High cholesterol    History of kidney stones    x3   Hypertension    Joint pain    Osteoporosis    Pneumonia    hx of 10 years ago   PONV (postoperative nausea and vomiting)    "mild nausea"   Post-menopausal    Prediabetes    Stress incontinence    Substance abuse ([place])    Vitamin D deficiency    Wears glasses     Past Surgical History:  Procedure Laterality Date   ABDOMINOPLASTY  09/2015   BREAST REDUCTION SURGERY  08/04/2015   BREAST REDUCTION SURGERY Bilateral 08/04/2015   Procedure: MAMMARY REDUCTION  (BREAST);  Surgeon: Crissie Reese, MD;  Location: [facility];  Service: Plastics;  Laterality: Bilateral;   BREAST SURGERY  1985   abscess   CARPAL TUNNEL RELEASE Left    CESAREAN SECTION  1989, [place]  2013   in the office   COSMETIC SURGERY     ear lobes  shorten   CYSTECTOMY     HAMMERTOE RECONSTRUCTION WITH WEIL OSTEOTOMY Right 06/26/2018   Procedure: Right 2nd toe hammertoe correction; 2nd metatarsal Weil osteotomy;  Surgeon: Wylene Simmer, MD;  Location: [facility];  Service: Orthopedics;  Laterality: Right;  76mn   NASAL SINUS SURGERY  2000   ORIF ANKLE FRACTURE Left 10/28/2018   Procedure: OPEN REDUCTION INTERNAL FIXATION (ORIF) LEFT ANKLE BIMALLEOLAR FRACTURE DISLOCATION;  Surgeon: HWylene Simmer MD;  [place] [facility]  Service: Orthopedics;  Laterality: Left;  96m   TOTAL KNEE ARTHROPLASTY Bilateral 06/05/2013   Procedure: BILATERAL TOTAL KNEE ARTHROPLASTY;  Surgeon: FrGearlean AlfMD;  Location: [facility];  Service: Orthopedics;  Laterality: Bilateral;    Prior to Admission medications   Medication Sig Start Date End Date Taking? Authorizing Provider  albuterol (VENTOLIN HFA) 108 (90 Base) MCG/ACT inhaler Inhale 2 puffs into the lungs every 4 (four) to 6 (six) hours as needed. 11/29/20  Yes   Alum Hydroxide-Mag Carbonate (GAVISCON PO) Take 1-3 tablets by mouth as needed (heartburn).   Yes [provider]  Ascorbic Acid (VITAMIN C) 500 MG CAPS Take 1 capsule  by mouth daily.   Yes [provider]  atomoxetine (STRATTERA) 80 MG capsule Take 1 capsule (80 mg total) by mouth daily in the morning 09/28/21  Yes   azelastine (ASTELIN) 0.1 % nasal spray Place 2 sprays into both nostrils 2 (two) times daily. 11/18/20  Yes   BLACK COHOSH PO Take 1 capsule by mouth daily.   Yes [provider]  buPROPion (WELLBUTRIN XL) 300 MG 24 hr tablet Take 1 tablet (300 mg total) by mouth in the morning. 07/24/21  Yes   Cholecalciferol (VITAMIN D3) 1000 UNITS CAPS Take 2,000 Units by mouth daily. Increases to 2000 U 11/16/21 10/29/11  Yes [provider]  diclofenac Sodium (VOLTAREN) 1 % GEL Apply 4 g topically to affected area 4 (four) times daily. 08/15/21  Yes   Insulin Pen Needle (TECHLITE PEN NEEDLES) 31G X 6  MM MISC Use a pen needle to inject Victoza under the skin once daily 01/09/22  Yes   metoprolol succinate (TOPROL-XL) 50 MG 24 hr tablet Take 2 tablets (100 mg total) by mouth daily. Take with or immediately following a meal. 07/19/21  Yes Cantwell, Celeste C, PA-C  montelukast (SINGULAIR) 10 MG tablet Take 1 tablet (10 mg total) by mouth every evening. 12/12/20  Yes   Multiple Vitamin (MULTIVITAMIN WITH MINERALS) TABS tablet Take 1 tablet by mouth daily.   Yes [provider]  omeprazole (PRILOSEC) 40 MG capsule Take 40 mg by mouth daily as needed (for acid reflux or heartburn). Reported on 04/21/2015   Yes [provider]  Prasterone (INTRAROSA) 6.5 MG INST Place 6.5 mg (1 insert) vaginally at bedtime. 12/06/21  Yes   rosuvastatin (CRESTOR) 20 MG tablet Take 1 tablet (20 mg total) by mouth daily. Do not take sat & Sun 11/16/21  Yes Adrian Prows, MD  sertraline (ZOLOFT) 100 MG tablet Take 1.5 tablets (150 mg total) by mouth daily. 07/24/21  Yes   valsartan (DIOVAN) 160 MG tablet Take 1.5 tablets (240 mg total) by mouth daily. 12/18/21  Yes   albuterol (VENTOLIN HFA) 108 (90 Base) MCG/ACT inhaler Inhale 2 puffs into the lungs every 4 to 6 hours as needed 01/18/22     amoxicillin-clavulanate (AUGMENTIN) 875-125 MG tablet Take 1 tablet by mouth 2 (two) times daily for 10 days 01/18/22     aspirin 81 MG EC tablet Take 81 mg by mouth daily. Swallow whole.    [provider]  denosumab (PROLIA) 60 MG/ML SOLN injection Inject 60 mg into the skin once. Administer in upper arm, thigh, or abdomen Patient taking differently: Inject 60 mg into the skin every 6 (six) months. Administer in upper arm, thigh, or abdomen 04/24/16 12/15/21  Terrance Mass, MD  liraglutide (VICTOZA) 18 MG/3ML SOPN Inject 1.8 mg into the skin daily. 01/09/22     loratadine (CLARITIN) 10 MG tablet 1 tablet Orally Once a day    [provider]  predniSONE (DELTASONE) 50 MG tablet Take 1 tablet (50 mg total) by  mouth daily for 5 days with food 01/18/22     sertraline (ZOLOFT) 100 MG tablet Take 1.5 tablets (150 mg total) by mouth daily. 04/04/20   [name], MD  umeclidinium-vilanterol [facility] ELLIPTA) 62.5-25 MCG/ACT AEPB Inhale 1 puff into the lungs daily. 12/12/20   [name], MD    Current Outpatient Medications  Medication Sig Dispense Refill   albuterol (VENTOLIN HFA) 108 (90 Base) MCG/ACT inhaler Inhale 2 puffs into the lungs every 4 (four) to  6 (six) hours as needed. 18 g 0   Alum Hydroxide-Mag Carbonate (GAVISCON PO) Take 1-3 tablets by mouth as needed (heartburn).     Ascorbic Acid (VITAMIN C) 500 MG CAPS Take 1 capsule by mouth daily.     atomoxetine (STRATTERA) 80 MG capsule Take 1 capsule (80 mg total) by mouth daily in the morning 90 capsule 1   azelastine (ASTELIN) 0.1 % nasal spray Place 2 sprays into both nostrils 2 (two) times daily. 30 mL 0   BLACK COHOSH PO Take 1 capsule by mouth daily.     buPROPion (WELLBUTRIN XL) 300 MG 24 hr tablet Take 1 tablet (300 mg total) by mouth in the morning. 90 tablet 3   Cholecalciferol (VITAMIN D3) 1000 UNITS CAPS Take 2,000 Units by mouth daily. Increases to 2000 U 11/16/21     diclofenac Sodium (VOLTAREN) 1 % GEL Apply 4 g topically to affected area 4 (four) times daily. 500 g 11   Insulin Pen Needle (TECHLITE PEN NEEDLES) 31G X 6 MM MISC Use a pen needle to inject Victoza under the skin once daily 200 each 0   metoprolol succinate (TOPROL-XL) 50 MG 24 hr tablet Take 2 tablets (100 mg total) by mouth daily. Take with or immediately following a meal. 180 tablet 2   montelukast (SINGULAIR) 10 MG tablet Take 1 tablet (10 mg total) by mouth every evening. 90 tablet 3   Multiple Vitamin (MULTIVITAMIN WITH MINERALS) TABS tablet Take 1 tablet by mouth daily.     omeprazole (PRILOSEC) 40 MG capsule Take 40 mg by mouth daily as needed (for acid reflux or heartburn). Reported on 04/21/2015     Prasterone (INTRAROSA) 6.5 MG INST Place 6.5 mg (1  insert) vaginally at bedtime. 28 each 11   rosuvastatin (CRESTOR) 20 MG tablet Take 1 tablet (20 mg total) by mouth daily. Do not take sat & Sun 90 tablet 3   sertraline (ZOLOFT) 100 MG tablet Take 1.5 tablets (150 mg total) by mouth daily. 145 tablet 3   valsartan (DIOVAN) 160 MG tablet Take 1.5 tablets (240 mg total) by mouth daily. 145 tablet 1   albuterol (VENTOLIN HFA) 108 (90 Base) MCG/ACT inhaler Inhale 2 puffs into the lungs every 4 to 6 hours as needed 6.7 g 2   amoxicillin-clavulanate (AUGMENTIN) 875-125 MG tablet Take 1 tablet by mouth 2 (two) times daily for 10 days 20 tablet 0   aspirin 81 MG EC tablet Take 81 mg by mouth daily. Swallow whole.     denosumab (PROLIA) 60 MG/ML SOLN injection Inject 60 mg into the skin once. Administer in upper arm, thigh, or abdomen (Patient taking differently: Inject 60 mg into the skin every 6 (six) months. Administer in upper arm, thigh, or abdomen) 1 Syringe 0   liraglutide (VICTOZA) 18 MG/3ML SOPN Inject 1.8 mg into the skin daily. 27 mL 0   loratadine (CLARITIN) 10 MG tablet 1 tablet Orally Once a day     predniSONE (DELTASONE) 50 MG tablet Take 1 tablet (50 mg total) by mouth daily for 5 days with food 5 tablet 0   sertraline (ZOLOFT) 100 MG tablet Take 1.5 tablets (150 mg total) by mouth daily. 145 tablet 1   umeclidinium-vilanterol (ANORO ELLIPTA) 62.5-25 MCG/ACT AEPB Inhale 1 puff into the lungs daily. 60 each 3   Current Facility-Administered Medications  Medication Dose Route Frequency Provider Last Rate Last Admin   0.9 %  sodium chloride infusion  500 mL Intravenous Continuous Aldred Mase V,  MD        Allergies as of 02/28/2022 - Review Complete 02/28/2022  Allergen Reaction Noted   Morphine Itching, Anxiety, and Rash     Family History  Problem Relation Age of Onset   Cancer Mother    Hyperlipidemia Mother    Hypertension Mother    Kidney disease Mother    Atrial fibrillation Mother    Breast cancer Mother    Ovarian  cancer Mother    Heart disease Mother    Depression Mother    Alcohol abuse Mother    Heart disease Father    Hyperlipidemia Father    Hypertension Father    Heart attack Father    Colon cancer Neg Hx    Esophageal cancer Neg Hx    Stomach cancer Neg Hx    Rectal cancer Neg Hx     Social History   Socioeconomic History   Marital status: Divorced    Spouse name: Not on file   Number of children: 2   Years of education: Not on file   Highest education level: Not on file  Occupational History   Occupation: Education officer, museum   Occupation: Retired Cabin crew  Tobacco Use   Smoking status: Former    Packs/day: 1.00    Years: 30.00    Total pack years: 30.00    Types: Cigarettes    Quit date: 10/19/2017    Years since quitting: 4.3   Smokeless tobacco: Never   Tobacco comments:    Stop Smoking Oct..2019  Vaping Use   Vaping Use: Former  Substance and Sexual Activity   Alcohol use: Not Currently   Drug use: No    Comment: hx of cocaine, none 1982   Sexual activity: Not Currently    Birth control/protection: Post-menopausal, Abstinence    Comment: 1st intercourse 71 yo-more than 5 partners  Other Topics Concern   Not on file  Social History Narrative   Not on file   Social Determinants of Health   Financial Resource Strain: Not on file  Food Insecurity: Not on file  Transportation Needs: Not on file  Physical Activity: Not on file  Stress: Not on file  Social Connections: Not on file  Intimate Partner Violence: Not on file    Review of Systems:  All other review of systems negative except as mentioned in the HPI.  Physical Exam: Vital signs in last 24 hours: Blood Pressure 139/65   Pulse 69   Temperature (Abnormal) 97.3 F (36.3 C) (Temporal)   Height 5' 6"$  (1.676 m)   Weight 169 lb (76.7 kg)   Last Menstrual Period  (LMP Unknown)   Oxygen Saturation 96%   Body Mass Index 27.28 kg/m  General:   Alert, NAD Lungs:  Clear .   Heart:  Regular rate and  rhythm Abdomen:  Soft, nontender and nondistended. Neuro/Psych:  Alert and cooperative. Normal mood and affect. A and O x 3  Reviewed labs, radiology imaging, old records and pertinent past GI work up  Patient is appropriate for planned procedure(s) and anesthesia in an ambulatory setting   K. Denzil Magnuson , MD [phone]

## 2022-02-28 NOTE — Progress Notes (Signed)
VS completed by DT.  Pt's states no medical or surgical changes since previsit or office visit.  

## 2022-02-28 NOTE — Op Note (Signed)
[facility] Patient Name: Sharon Daniels Procedure Date: 02/28/2022 1:39 PM MRN: [id number] Endoscopist: Mauri Pole , MD, [id number] Age: 72 Referring MD:  Date of Birth: July 20, 1950 Gender: Female Account #: [id number] Procedure:                Colonoscopy Indications:              High risk colon cancer surveillance: Personal                            history of colonic polyps Medicines:                Monitored Anesthesia Care Procedure:                Pre-Anesthesia Assessment:                           - Prior to the procedure, a History and Physical                            was performed, and patient medications and                            allergies were reviewed. The patient's tolerance of                            previous anesthesia was also reviewed. The risks                            and benefits of the procedure and the sedation                            options and risks were discussed with the patient.                            All questions were answered, and informed consent                            was obtained. Prior Anticoagulants: The patient has                            taken no anticoagulant or antiplatelet agents. ASA                            Grade Assessment: III - A patient with severe                            systemic disease. After reviewing the risks and                            benefits, the patient was deemed in satisfactory                            condition to undergo the procedure.  After obtaining informed consent, the colonoscope                            was passed under direct vision. Throughout the                            procedure, the patient's blood pressure, pulse, and                            oxygen saturations were monitored continuously. The                            Olympus [id number] Colonoscope was                            introduced through the  anus with the intention of                            advancing to the cecum. The scope was advanced to                            the rectum before the procedure was aborted.                            Medications were given. The colonoscopy was                            performed with difficulty due to poor bowel prep                            with stool present. The patient tolerated the                            procedure well. The quality of the bowel                            preparation was poor. The rectum was photographed. Scope In: Scope Out: Findings:                 The perianal and digital rectal examinations were                            normal.                           A large amount of liquid semi-liquid semi-solid                            stool was found in the rectum, making visualization                            difficult and caused repeated clogging of the scope  channel. Lavage of the area was performed,                            resulting in incomplete clearance with continued                            poor visualization. Complications:            No immediate complications. Estimated Blood Loss:     Estimated blood loss: none. Impression:               - Preparation of the colon was poor.                           - Procedure was aborted                           - No specimens collected. Recommendation:           - Patient has a contact number available for                            emergencies. The signs and symptoms of potential                            delayed complications were discussed with the                            patient. Return to normal activities tomorrow.                            Written discharge instructions were provided to the                            patient.                           - Resume previous diet.                           - Continue present medications.                           -  Repeat colonoscopy at the next available                            appointment because the bowel preparation was                            suboptimal.                           - For future colonoscopy the patient will require                            an extended preparation. If there are any  questions, please contact the gastroenterologist. Mauri Pole, MD 02/28/2022 2:12:14 PM This report has been signed electronically.

## 2022-02-28 NOTE — Op Note (Signed)
[facility] Patient Name: Sharon Daniels Procedure Date: 02/28/2022 1:39 PM MRN: [id number] Endoscopist: Mauri Pole , MD, [id number] Age: 72 Referring MD:  Date of Birth: [birth year] Gender: Female Account #: [id number] Procedure:                Upper GI endoscopy Indications:              Dysphagia, Epigastric abdominal pain Medicines:                Monitored Anesthesia Care Procedure:                Pre-Anesthesia Assessment:                           - Prior to the procedure, a History and Physical                            was performed, and patient medications and                            allergies were reviewed. The patient's tolerance of                            previous anesthesia was also reviewed. The risks                            and benefits of the procedure and the sedation                            options and risks were discussed with the patient.                            All questions were answered, and informed consent                            was obtained. Prior Anticoagulants: The patient has                            taken no anticoagulant or antiplatelet agents. ASA                            Grade Assessment: III - A patient with severe                            systemic disease. After reviewing the risks and                            benefits, the patient was deemed in satisfactory                            condition to undergo the procedure.                           After obtaining informed consent, the endoscope was  passed under direct vision. Throughout the                            procedure, the patient's blood pressure, pulse, and                            oxygen saturations were monitored continuously. The                            GIF [id number] was introduced through the                            mouth, and advanced to the second part of duodenum.                            The  upper GI endoscopy was accomplished without                            difficulty. The patient tolerated the procedure                            well. Scope In: Scope Out: Findings:                 Two superficial esophageal ulcers with no bleeding                            and no stigmata of recent bleeding were found 36 to                            38 cm from the incisors. The largest lesion was 3                            mm in largest dimension. Biopsies were taken with a                            cold forceps for histology.                           A 2 cm hiatal hernia was present.                           Patchy moderate inflammation characterized by                            adherent blood, congestion (edema), erosions and                            erythema was found in the gastric antrum and in the                            prepyloric region of the stomach. Biopsies were  taken with a cold forceps for histology. Biopsies                            were taken with a cold forceps for Helicobacter                            pylori testing.                           The cardia and gastric fundus were normal on                            retroflexion.                           A single 2 mm angioectasia with typical                            arborization was found in the second portion of the                            duodenum.                           The exam of the duodenum was otherwise normal. Complications:            No immediate complications. Estimated Blood Loss:     Estimated blood loss was minimal. Impression:               - Esophageal ulcers with no bleeding and no                            stigmata of recent bleeding. Biopsied.                           - 2 cm hiatal hernia.                           - Gastritis. Biopsied.                           - A single angioectasia in the duodenum. Recommendation:           - Patient has  a contact number available for                            emergencies. The signs and symptoms of potential                            delayed complications were discussed with the                            patient. Return to normal activities tomorrow.                            Written discharge instructions were provided to the  patient.                           - Resume previous diet.                           - Continue present medications.                           - Await pathology results.                           - No aspirin, ibuprofen, naproxen, or other                            non-steroidal anti-inflammatory drugs.                           - Use Prilosec (omeprazole) 40 mg PO BID for 3                            months, then decrease to 76m once daily.                           - Return to GI office in 3 months. Please call to                            schedule appointment KMauri Pole MD 02/28/2022 2:17:19 PM This report has been signed electronically.

## 2022-03-01 ENCOUNTER — Telehealth: Payer: Self-pay

## 2022-03-01 NOTE — Telephone Encounter (Signed)
Left message on follow up call. 

## 2022-03-06 ENCOUNTER — Other Ambulatory Visit: Payer: Self-pay

## 2022-03-06 ENCOUNTER — Other Ambulatory Visit (HOSPITAL_COMMUNITY): Payer: Self-pay

## 2022-03-07 ENCOUNTER — Other Ambulatory Visit (HOSPITAL_COMMUNITY): Payer: Self-pay

## 2022-03-07 ENCOUNTER — Other Ambulatory Visit: Payer: Self-pay

## 2022-03-07 MED ORDER — AZELASTINE HCL 0.1 % NA SOLN
2.0000 | Freq: Two times a day (BID) | NASAL | 0 refills | Status: DC
  Filled 2022-03-07: qty 30, 25d supply, fill #0

## 2022-03-13 ENCOUNTER — Other Ambulatory Visit: Payer: Medicare Other

## 2022-03-20 ENCOUNTER — Encounter: Payer: Medicare Other | Admitting: Gastroenterology

## 2022-03-21 ENCOUNTER — Encounter: Payer: Self-pay | Admitting: Gastroenterology

## 2022-03-22 ENCOUNTER — Other Ambulatory Visit: Payer: Self-pay

## 2022-03-22 ENCOUNTER — Other Ambulatory Visit: Payer: Medicare Other

## 2022-03-22 DIAGNOSIS — M810 Age-related osteoporosis without current pathological fracture: Secondary | ICD-10-CM

## 2022-03-23 LAB — EXTRA LAV TOP TUBE

## 2022-03-23 LAB — CALCIUM: Calcium: 9.8 mg/dL (ref 8.6–10.4)

## 2022-03-27 ENCOUNTER — Encounter: Payer: Self-pay | Admitting: Physical Therapy

## 2022-03-27 ENCOUNTER — Ambulatory Visit: Payer: Medicare Other | Attending: Obstetrics & Gynecology | Admitting: Physical Therapy

## 2022-03-27 ENCOUNTER — Other Ambulatory Visit: Payer: Self-pay

## 2022-03-27 DIAGNOSIS — M6281 Muscle weakness (generalized): Secondary | ICD-10-CM | POA: Diagnosis not present

## 2022-03-27 DIAGNOSIS — R279 Unspecified lack of coordination: Secondary | ICD-10-CM | POA: Diagnosis not present

## 2022-03-27 DIAGNOSIS — M62838 Other muscle spasm: Secondary | ICD-10-CM | POA: Insufficient documentation

## 2022-03-27 DIAGNOSIS — N3946 Mixed incontinence: Secondary | ICD-10-CM | POA: Insufficient documentation
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
## 2022-04-04 ENCOUNTER — Other Ambulatory Visit (HOSPITAL_COMMUNITY): Payer: Self-pay

## 2022-04-04 MED ORDER — ATOMOXETINE HCL 80 MG PO CAPS
80.0000 mg | ORAL_CAPSULE | Freq: Every morning | ORAL | 1 refills | Status: AC
  Filled 2022-04-04 – 2022-05-30 (×2): qty 90, 90d supply, fill #0
  Filled 2022-09-06 – 2022-09-19 (×3): qty 90, 90d supply, fill #1

## 2022-04-04 MED ORDER — VICTOZA 18 MG/3ML ~~LOC~~ SOPN
1.8000 mg | PEN_INJECTOR | Freq: Every day | SUBCUTANEOUS | 3 refills | Status: DC
  Filled 2022-04-04: qty 9, 30d supply, fill #0
  Filled 2022-05-30: qty 9, 30d supply, fill #1

## 2022-04-04 MED ORDER — BUPROPION HCL ER (XL) 300 MG PO TB24
300.0000 mg | ORAL_TABLET | Freq: Every day | ORAL | 3 refills | Status: DC
  Filled 2022-04-04 – 2022-05-30 (×2): qty 90, 90d supply, fill #0

## 2022-04-04 MED ORDER — SERTRALINE HCL 100 MG PO TABS
150.0000 mg | ORAL_TABLET | Freq: Every day | ORAL | 3 refills | Status: DC
  Filled 2022-04-04: qty 135, 90d supply, fill #0

## 2022-04-04 MED ORDER — SUCRALFATE 1 G PO TABS
1.0000 g | ORAL_TABLET | Freq: Three times a day (TID) | ORAL | 2 refills | Status: AC
  Filled 2022-04-04: qty 90, 30d supply, fill #0
  Filled 2022-10-27: qty 90, 30d supply, fill #1

## 2022-04-06 ENCOUNTER — Other Ambulatory Visit (HOSPITAL_COMMUNITY): Payer: Self-pay

## 2022-04-07 ENCOUNTER — Other Ambulatory Visit (HOSPITAL_COMMUNITY): Payer: Self-pay

## 2022-04-07 MED ORDER — LAMOTRIGINE 25 MG PO TABS
ORAL_TABLET | ORAL | 0 refills | Status: DC
  Filled 2022-04-07: qty 42, 28d supply, fill #0

## 2022-04-09 ENCOUNTER — Other Ambulatory Visit (HOSPITAL_COMMUNITY): Payer: Self-pay

## 2022-04-18 ENCOUNTER — Other Ambulatory Visit: Payer: Self-pay

## 2022-04-18 ENCOUNTER — Encounter: Payer: Self-pay | Admitting: Gastroenterology

## 2022-04-18 ENCOUNTER — Ambulatory Visit (AMBULATORY_SURGERY_CENTER): Payer: Medicare Other

## 2022-04-18 VITALS — Ht 66.0 in | Wt 168.0 lb

## 2022-04-18 DIAGNOSIS — Z86010 Personal history of colonic polyps: Secondary | ICD-10-CM

## 2022-04-18 NOTE — Progress Notes (Signed)
Denies allergies to eggs or soy products. Denies complication of anesthesia or sedation. Denies use of weight loss medication. Denies use of O2.   Emmi instructions given for colonoscopy.  

## 2022-04-25 ENCOUNTER — Other Ambulatory Visit (HOSPITAL_COMMUNITY): Payer: Self-pay

## 2022-04-25 MED ORDER — LAMOTRIGINE 100 MG PO TABS
ORAL_TABLET | ORAL | 2 refills | Status: DC
  Filled 2022-04-25: qty 45, 30d supply, fill #0
  Filled 2022-05-30: qty 45, 5d supply, fill #1

## 2022-04-26 ENCOUNTER — Other Ambulatory Visit (HOSPITAL_COMMUNITY): Payer: Self-pay

## 2022-04-30 ENCOUNTER — Ambulatory Visit: Payer: Medicare Other | Attending: Obstetrics & Gynecology | Admitting: Physical Therapy

## 2022-04-30 DIAGNOSIS — R279 Unspecified lack of coordination: Secondary | ICD-10-CM

## 2022-04-30 DIAGNOSIS — M62838 Other muscle spasm: Secondary | ICD-10-CM | POA: Diagnosis present

## 2022-04-30 DIAGNOSIS — M6281 Muscle weakness (generalized): Secondary | ICD-10-CM
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
## 2022-05-06 ENCOUNTER — Encounter: Payer: Self-pay | Admitting: Certified Registered Nurse Anesthetist

## 2022-05-08 ENCOUNTER — Telehealth: Payer: Self-pay | Admitting: *Deleted

## 2022-05-08 ENCOUNTER — Other Ambulatory Visit (HOSPITAL_COMMUNITY): Payer: Self-pay

## 2022-05-08 DIAGNOSIS — M810 Age-related osteoporosis without current pathological fracture: Secondary | ICD-10-CM

## 2022-05-08 MED ORDER — ROSUVASTATIN CALCIUM 20 MG PO TABS
20.0000 mg | ORAL_TABLET | Freq: Every day | ORAL | 3 refills | Status: AC
  Filled 2022-05-08: qty 90, 90d supply, fill #0
  Filled 2022-09-06: qty 90, 90d supply, fill #1
  Filled 2022-10-27: qty 90, 90d supply, fill #2
  Filled 2023-02-14: qty 90, 90d supply, fill #3

## 2022-05-08 NOTE — Telephone Encounter (Signed)
Prior authorization signed by Dr. Seymour Bars and faxed to [facility] [place], fax #: [phone]. Will await response.

## 2022-05-08 NOTE — Telephone Encounter (Signed)
Call to [facility] of [place], spoke with [place], in regards to PA for prolia. Alle[place]a states PA required for prolia and PA not currently on file. Fax number provided and Allelip[employer] will fax PA forms to our office for completion. Reference #: [id number].

## 2022-05-08 NOTE — Telephone Encounter (Signed)
Call to patient. Patient updated of need to submit PA to [facility]. Patient agreeable. Advised would review benefits after PA response received. Patient requests to get prolia same day of annual exam in June.

## 2022-05-10 ENCOUNTER — Encounter: Payer: Self-pay | Admitting: Internal Medicine

## 2022-05-10 ENCOUNTER — Ambulatory Visit (AMBULATORY_SURGERY_CENTER): Payer: Medicare Other | Admitting: Internal Medicine

## 2022-05-10 VITALS — BP 127/64 | HR 65 | Temp 98.1°F | Resp 16 | Ht 66.0 in | Wt 168.0 lb

## 2022-05-10 DIAGNOSIS — Z86010 Personal history of colonic polyps: Secondary | ICD-10-CM | POA: Diagnosis not present

## 2022-05-10 DIAGNOSIS — K635 Polyp of colon: Secondary | ICD-10-CM | POA: Diagnosis not present

## 2022-05-10 DIAGNOSIS — Z09 Encounter for follow-up examination after completed treatment for conditions other than malignant neoplasm: Secondary | ICD-10-CM | POA: Diagnosis present

## 2022-05-10 DIAGNOSIS — D124 Benign neoplasm of descending colon: Secondary | ICD-10-CM | POA: Diagnosis not present

## 2022-05-10 MED ORDER — SODIUM CHLORIDE 0.9 % IV SOLN: 500.0000 mL | Freq: Once | INTRAVENOUS | Status: DC

## 2022-05-10 NOTE — Op Note (Signed)
[facility] Patient Name: Sharon Daniels Procedure Date: 05/10/2022 3:21 PM MRN: [id number] Endoscopist: Iva Boop , MD, [id number] Age: 72 Referring MD:  Date of Birth: May 26, 1950 Gender: Female Account #: [id number] Procedure:                Colonoscopy Indications:              Surveillance: Personal history of adenomatous                            polyps on last colonoscopy 3 years ago Medicines:                Monitored Anesthesia Care Procedure:                Pre-Anesthesia Assessment:                           - Prior to the procedure, a History and Physical                            was performed, and patient medications and                            allergies were reviewed. The patient's tolerance of                            previous anesthesia was also reviewed. The risks                            and benefits of the procedure and the sedation                            options and risks were discussed with the patient.                            All questions were answered, and informed consent                            was obtained. Prior Anticoagulants: The patient has                            taken no anticoagulant or antiplatelet agents. ASA                            Grade Assessment: III - A patient with severe                            systemic disease. After reviewing the risks and                            benefits, the patient was deemed in satisfactory                            condition to undergo the procedure.  After obtaining informed consent, the colonoscope                            was passed under direct vision. Throughout the                            procedure, the patient's blood pressure, pulse, and                            oxygen saturations were monitored continuously. The                            Olympus PCF-H190DL ([id number]) Colonoscope was                            introduced through  the anus and advanced to the the                            cecum, identified by appendiceal orifice and                            ileocecal valve. The colonoscopy was performed                            without difficulty. The patient tolerated the                            procedure well. The quality of the bowel                            preparation was good. The ileocecal valve,                            appendiceal orifice, and rectum were photographed.                            The bowel preparation used was Miralax via extended                            prep with split dose instruction. Scope In: 3:30:00 PM Scope Out: 3:53:13 PM Scope Withdrawal Time: 0 hours 13 minutes 36 seconds  Total Procedure Duration: 0 hours 23 minutes 13 seconds  Findings:                 The perianal and digital rectal examinations were                            normal.                           A diminutive polyp was found in the descending                            colon. The polyp was sessile. The polyp was removed  with a cold snare. Resection and retrieval were                            complete. Verification of patient identification                            for the specimen was done. Estimated blood loss was                            minimal.                           The exam was otherwise without abnormality on                            direct and retroflexion views. Complications:            No immediate complications. Estimated Blood Loss:     Estimated blood loss was minimal. Impression:               - One diminutive polyp in the descending colon,                            removed with a cold snare. Resected and retrieved.                           - The examination was otherwise normal on direct                            and retroflexion views.                           - Personal history of colonic polyps - 3 diminutive                             adenomas 2021 Recommendation:           - Patient has a contact number available for                            emergencies. The signs and symptoms of potential                            delayed complications were discussed with the                            patient. Return to normal activities tomorrow.                            Written discharge instructions were provided to the                            patient.                           - Resume previous diet.                           -  Continue present medications.                           - Await pathology results.                           - No recommendation at this time regarding repeat                            colonoscopy due to age. Iva Boop, MD 05/10/2022 4:00:02 PM This report has been signed electronically.

## 2022-05-10 NOTE — Progress Notes (Signed)
[facility] History and Physical   Primary Care Physician:  Lewis Moccasin, MD   Reason for Procedure:   Hx colon polyps  Plan:    colonoscopy     HPI: Sharon Daniels is a 72 y.o. female w/ hx colon polyps  3 adenomas removed 2021 Past Medical History:  Diagnosis Date   Acute pulmonary embolism (HCC) 06/13/2013   CT angio chest 06/12/13   ADHD    Alcohol abuse    Allergy    Anemia    Anxiety    Arthritis    Back pain    Clotting disorder (HCC)    Complication of anesthesia    "during breast surgery: could hear the surgery, wake up too soon, anxious   Constipation    COPD (chronic obstructive pulmonary disease) (HCC)    Depression    hx of   DVT (deep venous thrombosis) (HCC) 2015   Fatty liver    Frequent sinus infections    GERD (gastroesophageal reflux disease)    H/O bronchitis    Hard of hearing    High cholesterol    History of kidney stones    x3   Hypertension    Joint pain    Osteoporosis    Pneumonia    hx of 10 years ago   PONV (postoperative nausea and vomiting)    "mild nausea"   Post-menopausal    Prediabetes    Stress incontinence    Substance abuse (HCC)    Vitamin D deficiency    Wears glasses     Past Surgical History:  Procedure Laterality Date   ABDOMINOPLASTY  09/2015   BREAST REDUCTION SURGERY  08/04/2015   BREAST REDUCTION SURGERY Bilateral 08/04/2015   Procedure: MAMMARY REDUCTION  (BREAST);  Surgeon: Etter Sjogren, MD;  Location: [facility] OR;  Service: Plastics;  Laterality: Bilateral;   BREAST SURGERY  1985   abscess   CARPAL TUNNEL RELEASE Left    CESAREAN SECTION  1989, 1992   COLONOSCOPY     COSMETIC SURGERY  2013   in the office   COSMETIC SURGERY     ear lobes shorten   CYSTECTOMY     HAMMERTOE RECONSTRUCTION WITH WEIL OSTEOTOMY Right 06/26/2018   Procedure: Right 2nd toe hammertoe correction; 2nd metatarsal Weil osteotomy;  Surgeon: Toni Arthurs, MD;  Location: [facility];  Service:  Orthopedics;  Laterality: Right;    NASAL SINUS SURGERY  2000   ORIF ANKLE FRACTURE Left 10/28/2018   Procedure: OPEN REDUCTION INTERNAL FIXATION (ORIF) LEFT ANKLE BIMALLEOLAR FRACTURE DISLOCATION;  Surgeon: Toni Arthurs, MD;  Location: [facility] OR;  Service: Orthopedics;  Laterality: Left;    TOTAL KNEE ARTHROPLASTY Bilateral 06/05/2013   Procedure: BILATERAL TOTAL KNEE ARTHROPLASTY;  Surgeon: Loanne Drilling, MD;  Location: [facility] ORS;  Service: Orthopedics;  Laterality: Bilateral;    Prior to Admission medications   Medication Sig Start Date End Date Taking? Authorizing Provider  Ascorbic Acid (VITAMIN C) 500 MG CAPS Take 1 capsule by mouth daily.   Yes [provider]  aspirin 81 MG EC tablet Take 81 mg by mouth daily. Swallow whole.   Yes [provider]  atomoxetine (STRATTERA) 80 MG capsule Take 1 capsule (80 mg total) by mouth in the morning. 04/04/22  Yes   azelastine (ASTELIN) 0.1 % nasal spray Place 2 sprays into both nostrils 2 (two) times daily. 03/07/22  Yes   BLACK COHOSH PO Take 1 capsule by mouth daily.  Yes [provider]  buPROPion (WELLBUTRIN XL) 300 MG 24 hr tablet Take 1 tablet (300 mg total) by mouth daily in the morning 04/04/22  Yes Lewis Moccasin, MD  Cholecalciferol (VITAMIN D3) 1000 UNITS CAPS Take 2,000 Units by mouth daily. Increases to 2000 U 11/16/21 10/29/11  Yes [provider]  diclofenac Sodium (VOLTAREN) 1 % GEL Apply 4 g topically to affected area 4 (four) times daily. 08/15/21  Yes   lamoTRIgine (LAMICTAL) 100 MG tablet Take 1 tablet (100 mg total) by mouth daily for 14 days, THEN 1&1/2 tablets (150 mg total) daily. 04/25/22 05/30/22 Yes   metoprolol succinate (TOPROL-XL) 50 MG 24 hr tablet Take 2 tablets (100 mg total) by mouth daily. Take with or immediately following a meal. 07/19/21  Yes Cantwell, Celeste C, PA-C  montelukast (SINGULAIR) 10 MG tablet Take 1 tablet (10 mg total) by mouth every evening. 12/12/20  Yes    Multiple Vitamin (MULTIVITAMIN WITH MINERALS) TABS tablet Take 1 tablet by mouth daily.   Yes [provider]  omeprazole (PRILOSEC) 40 MG capsule Take 40 mg by mouth daily as needed (for acid reflux or heartburn). Reported on 04/21/2015   Yes [provider]  rosuvastatin (CRESTOR) 20 MG tablet Take 1 tablet (20 mg total) by mouth daily. 05/08/22  Yes   valsartan (DIOVAN) 160 MG tablet Take 1.5 tablets (240 mg total) by mouth daily. 12/18/21  Yes   albuterol (VENTOLIN HFA) 108 (90 Base) MCG/ACT inhaler Inhale 2 puffs into the lungs every 4 (four) to 6 (six) hours as needed. 11/29/20     albuterol (VENTOLIN HFA) 108 (90 Base) MCG/ACT inhaler Inhale 2 puffs into the lungs every 4 to 6 hours as needed 01/18/22     Alum Hydroxide-Mag Carbonate (GAVISCON PO) Take 1-3 tablets by mouth as needed (heartburn).    [provider]  buPROPion (WELLBUTRIN XL) 300 MG 24 hr tablet Take 1 tablet (300 mg total) by mouth in the morning. 07/24/21     denosumab (PROLIA) 60 MG/ML SOLN injection Inject 60 mg into the skin once. Administer in upper arm, thigh, or abdomen Patient taking differently: Inject 60 mg into the skin every 6 (six) months. Administer in upper arm, thigh, or abdomen 04/24/16 12/15/21  Ok Edwards, MD  Insulin Pen Needle (TECHLITE PEN NEEDLES) 31G X 6 MM MISC Use a pen needle to inject Victoza under the skin once daily 01/09/22     lamoTRIgine (LAMICTAL) 25 MG tablet Take 1 tablet (25 mg total) by mouth daily for 14 days, THEN 2 tablets (50 mg total) daily Patient not taking: Reported on 05/10/2022 04/06/22 05/07/22    liraglutide (VICTOZA) 18 MG/3ML SOPN Inject 1.8 mg into the skin daily. 04/04/22     Prasterone (INTRAROSA) 6.5 MG INST Place 6.5 mg (1 insert) vaginally at bedtime. 12/06/21     sucralfate (CARAFATE) 1 g tablet Take 1 tablet (1 g total) by mouth 3 (three) times daily, 1 hour before meals on an empty stomach 04/04/22       Current Outpatient Medications   Medication Sig Dispense Refill   Ascorbic Acid (VITAMIN C) 500 MG CAPS Take 1 capsule by mouth daily.     aspirin 81 MG EC tablet Take 81 mg by mouth daily. Swallow whole.     atomoxetine (STRATTERA) 80 MG capsule Take 1 capsule (80 mg total) by mouth in the morning. 90 capsule 1   azelastine (ASTELIN) 0.1 % nasal spray Place 2 sprays into both  nostrils 2 (two) times daily. 30 mL 0   BLACK COHOSH PO Take 1 capsule by mouth daily.     buPROPion (WELLBUTRIN XL) 300 MG 24 hr tablet Take 1 tablet (300 mg total) by mouth daily in the morning 90 tablet 3   Cholecalciferol (VITAMIN D3) 1000 UNITS CAPS Take 2,000 Units by mouth daily. Increases to 2000 U 11/16/21     diclofenac Sodium (VOLTAREN) 1 % GEL Apply 4 g topically to affected area 4 (four) times daily. 500 g 11   lamoTRIgine (LAMICTAL) 100 MG tablet Take 1 tablet (100 mg total) by mouth daily for 14 days, THEN 1&1/2 tablets (150 mg total) daily. 45 tablet 2   metoprolol succinate (TOPROL-XL) 50 MG 24 hr tablet Take 2 tablets (100 mg total) by mouth daily. Take with or immediately following a meal. 180 tablet 2   montelukast (SINGULAIR) 10 MG tablet Take 1 tablet (10 mg total) by mouth every evening. 90 tablet 3   Multiple Vitamin (MULTIVITAMIN WITH MINERALS) TABS tablet Take 1 tablet by mouth daily.     omeprazole (PRILOSEC) 40 MG capsule Take 40 mg by mouth daily as needed (for acid reflux or heartburn). Reported on 04/21/2015     rosuvastatin (CRESTOR) 20 MG tablet Take 1 tablet (20 mg total) by mouth daily. 90 tablet 3   valsartan (DIOVAN) 160 MG tablet Take 1.5 tablets (240 mg total) by mouth daily. 145 tablet 1   albuterol (VENTOLIN HFA) 108 (90 Base) MCG/ACT inhaler Inhale 2 puffs into the lungs every 4 (four) to 6 (six) hours as needed. 18 g 0   albuterol (VENTOLIN HFA) 108 (90 Base) MCG/ACT inhaler Inhale 2 puffs into the lungs every 4 to 6 hours as needed 6.7 g 2   Alum Hydroxide-Mag Carbonate (GAVISCON PO) Take 1-3 tablets by mouth as  needed (heartburn).     buPROPion (WELLBUTRIN XL) 300 MG 24 hr tablet Take 1 tablet (300 mg total) by mouth in the morning. 90 tablet 3   denosumab (PROLIA) 60 MG/ML SOLN injection Inject 60 mg into the skin once. Administer in upper arm, thigh, or abdomen (Patient taking differently: Inject 60 mg into the skin every 6 (six) months. Administer in upper arm, thigh, or abdomen) 1 Syringe 0   Insulin Pen Needle (TECHLITE PEN NEEDLES) 31G X 6 MM MISC Use a pen needle to inject Victoza under the skin once daily 200 each 0   lamoTRIgine (LAMICTAL) 25 MG tablet Take 1 tablet (25 mg total) by mouth daily for 14 days, THEN 2 tablets (50 mg total) daily (Patient not taking: Reported on 05/10/2022) 42 tablet 0   liraglutide (VICTOZA) 18 MG/3ML SOPN Inject 1.8 mg into the skin daily. 9 mL 3   Prasterone (INTRAROSA) 6.5 MG INST Place 6.5 mg (1 insert) vaginally at bedtime. 28 each 11   sucralfate (CARAFATE) 1 g tablet Take 1 tablet (1 g total) by mouth 3 (three) times daily, 1 hour before meals on an empty stomach 90 tablet 2   Current Facility-Administered Medications  Medication Dose Route Frequency Provider Last Rate Last Admin   0.9 %  sodium chloride infusion  500 mL Intravenous Continuous Nandigam, Kavitha V, MD       0.9 %  sodium chloride infusion  500 mL Intravenous Once Iva Boop, MD        Allergies as of 05/10/2022 - Review Complete 05/10/2022  Allergen Reaction Noted   Morphine Itching, Anxiety, and Rash     Family  History  Problem Relation Age of Onset   Cancer Mother    Hyperlipidemia Mother    Hypertension Mother    Kidney disease Mother    Atrial fibrillation Mother    Breast cancer Mother    Ovarian cancer Mother    Heart disease Mother    Depression Mother    Alcohol abuse Mother    Heart disease Father    Hyperlipidemia Father    Hypertension Father    Heart attack Father    Colon cancer Neg Hx    Esophageal cancer Neg Hx    Stomach cancer Neg Hx    Rectal cancer Neg  Hx     Social History   Socioeconomic History   Marital status: Divorced    Spouse name: Not on file   Number of children: 2   Years of education: Not on file   Highest education level: Not on file  Occupational History   Occupation: Child psychotherapist   Occupation: Retired Veterinary surgeon  Tobacco Use   Smoking status: Former    Packs/day: 1.00    Years: 30.00    Additional pack years: 0.00    Total pack years: 30.00    Types: Cigarettes    Quit date: 10/19/2017    Years since quitting: 4.5   Smokeless tobacco: Never   Tobacco comments:    Stop Smoking Oct..2019  Vaping Use   Vaping Use: Every day   Substances: Flavoring  Substance and Sexual Activity   Alcohol use: Not Currently   Drug use: No    Comment: hx of cocaine, none 1982   Sexual activity: Not Currently    Birth control/protection: Post-menopausal, Abstinence    Comment: 1st intercourse 72 yo-more than 5 partners  Other Topics Concern   Not on file  Social History Narrative   Not on file   Social Determinants of Health   Financial Resource Strain: Not on file  Food Insecurity: Not on file  Transportation Needs: Not on file  Physical Activity: Not on file  Stress: Not on file  Social Connections: Not on file  Intimate Partner Violence: Not on file    Review of Systems:  All other review of systems negative except as mentioned in the HPI.  Physical Exam: Vital signs BP 130/78   Pulse 64   Temp 98.1 F (36.7 C)   Ht 5\' 6"  (1.676 m)   Wt 168 lb (76.2 kg)   LMP  (LMP Unknown)   SpO2 94%   BMI 27.12 kg/m   General:   Alert,  Well-developed, well-nourished, pleasant and cooperative in NAD Lungs:  Clear throughout to auscultation.   Heart:  Regular rate and rhythm; no murmurs, clicks, rubs,  or gallops. Abdomen:  Soft, nontender and nondistended. Normal bowel sounds.   Neuro/Psych:  Alert and cooperative. Normal mood and affect. A and O x 3   @Granvil Djordjevic  Sena Slate, MD, [facility]  Gastroenterology [phone] (pager) 05/10/2022 3:21 PM@

## 2022-05-10 NOTE — Progress Notes (Signed)
Called to room to assist during endoscopic procedure.  Patient ID and intended procedure confirmed with present staff. Received instructions for my participation in the procedure from the performing physician.  

## 2022-05-10 NOTE — Patient Instructions (Addendum)
I found and removed one tiny polyp that looks benign.  I will let you know pathology results  by mail and/or My Chart.  I suspect we can stop routine colonoscopy in you.  I appreciate the opportunity to care for you. Iva Boop, MD, FACG  YOU HAD AN ENDOSCOPIC PROCEDURE TODAY AT THE [facility]:   Refer to the procedure report that was given to you for any specific questions about what was found during the examination.  If the procedure report does not answer your questions, please call your gastroenterologist to clarify.  If you requested that your care partner not be given the details of your procedure findings, then the procedure report has been included in a sealed envelope for you to review at your convenience later.  YOU SHOULD EXPECT: Some feelings of bloating in the abdomen. Passage of more gas than usual.  Walking can help get rid of the air that was put into your GI tract during the procedure and reduce the bloating. If you had a lower endoscopy (such as a colonoscopy or flexible sigmoidoscopy) you may notice spotting of blood in your stool or on the toilet paper. If you underwent a bowel prep for your procedure, you may not have a normal bowel movement for a few days.  Please Note:  You might notice some irritation and congestion in your nose or some drainage.  This is from the oxygen used during your procedure.  There is no need for concern and it should clear up in a day or so.  SYMPTOMS TO REPORT IMMEDIATELY:  Following lower endoscopy (colonoscopy or flexible sigmoidoscopy):  Excessive amounts of blood in the stool  Significant tenderness or worsening of abdominal pains  Swelling of the abdomen that is new, acute  Fever of 100F or higher   For urgent or emergent issues, a gastroenterologist can be reached at any hour by calling [phone]. Do not use MyChart messaging for urgent concerns.    DIET:  We do recommend a small meal at first, but then you  may proceed to your regular diet.  Drink plenty of fluids but you should avoid alcoholic beverages for 24 hours.  ACTIVITY:  You should plan to take it easy for the rest of today and you should NOT DRIVE or use heavy machinery until tomorrow (because of the sedation medicines used during the test).    FOLLOW UP: Our staff will call the number listed on your records the next business day following your procedure.  We will call around 7:15- 8:00 am to check on you and address any questions or concerns that you may have regarding the information given to you following your procedure. If we do not reach you, we will leave a message.     If any biopsies were taken you will be contacted by phone or by letter within the next 1-3 weeks.  Please call us at [phone] if you have not heard about the biopsies in 3 weeks.    SIGNATURES/CONFIDENTIALITY: You and/or your care partner have signed paperwork which will be entered into your electronic medical record.  These signatures attest to the fact that that the information above on your After Visit Summary has been reviewed and is understood.  Full responsibility of the confidentiality of this discharge information lies with you and/or your care-partner.

## 2022-05-10 NOTE — Progress Notes (Signed)
Pt's states no medical or surgical changes since previsit or office visit. 

## 2022-05-10 NOTE — Progress Notes (Signed)
Vss nad trans to pacu 

## 2022-05-11 ENCOUNTER — Telehealth: Payer: Self-pay | Admitting: *Deleted

## 2022-05-11 NOTE — Telephone Encounter (Signed)
Left message on f/u call 

## 2022-05-14 ENCOUNTER — Ambulatory Visit: Payer: Medicare Other | Attending: Obstetrics & Gynecology | Admitting: Physical Therapy

## 2022-05-14 ENCOUNTER — Encounter: Payer: Self-pay | Admitting: Physical Therapy

## 2022-05-14 DIAGNOSIS — R279 Unspecified lack of coordination: Secondary | ICD-10-CM

## 2022-05-14 DIAGNOSIS — M6281 Muscle weakness (generalized): Secondary | ICD-10-CM | POA: Insufficient documentation

## 2022-05-14 DIAGNOSIS — M62838 Other muscle spasm: Secondary | ICD-10-CM | POA: Diagnosis present
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
## 2022-05-15 ENCOUNTER — Other Ambulatory Visit (HOSPITAL_COMMUNITY): Payer: Self-pay

## 2022-05-15 MED ORDER — DENOSUMAB 60 MG/ML ~~LOC~~ SOSY
60.0000 mg | PREFILLED_SYRINGE | Freq: Once | SUBCUTANEOUS | Status: AC
Start: 2022-05-15 — End: 2022-06-19
  Administered 2022-06-19: 60 mg via SUBCUTANEOUS

## 2022-05-15 NOTE — Telephone Encounter (Signed)
Deductible:  No  OOP MAX: $3500  Annual exam: scheduled 06-18-20   Calcium:     9.8       Date: 03-22-22   Upcoming dental procedures: No   Hx of Kidney Disease: No   Last Bone Density Scan: 07-05-20  Is Prior Authorization needed: yes, approved 05-09-22 to 05-08-23. Auth #: [id number]  Pt estimated Cost: ~$409.81       Coverage Details: For the primary MD purchase option, Prolia will be subject to a 20% coinsurance up to a $3500 OOP max ($0 met). Administration will be covered at 100%. No deductible applies.

## 2022-05-15 NOTE — Telephone Encounter (Signed)
Patient scheduled to receive Prolia same day as AEX on 06-19-22, see telephone encounter dated 12-28-21.   Summary of benefits and PA scanned into Epic.   Encounter closed.

## 2022-05-18 ENCOUNTER — Other Ambulatory Visit: Payer: Self-pay

## 2022-05-18 DIAGNOSIS — R1310 Dysphagia, unspecified: Secondary | ICD-10-CM

## 2022-05-21 ENCOUNTER — Ambulatory Visit: Payer: Medicare Other | Admitting: Physical Therapy

## 2022-05-23 ENCOUNTER — Encounter: Payer: Self-pay | Admitting: Internal Medicine

## 2022-05-29 ENCOUNTER — Ambulatory Visit: Payer: Medicare Other | Admitting: Physical Therapy

## 2022-05-29 ENCOUNTER — Encounter: Payer: Self-pay | Admitting: Physical Therapy

## 2022-05-29 ENCOUNTER — Other Ambulatory Visit (HOSPITAL_COMMUNITY): Payer: Self-pay

## 2022-05-29 DIAGNOSIS — M62838 Other muscle spasm: Secondary | ICD-10-CM

## 2022-05-29 DIAGNOSIS — M6281 Muscle weakness (generalized): Secondary | ICD-10-CM

## 2022-05-29 DIAGNOSIS — R279 Unspecified lack of coordination: Secondary | ICD-10-CM

## 2022-05-29 MED ORDER — VALSARTAN 160 MG PO TABS
240.0000 mg | ORAL_TABLET | Freq: Every day | ORAL | 1 refills | Status: DC
  Filled 2022-05-29: qty 135, 90d supply, fill #0

## 2022-05-29 MED ORDER — PILOCARPINE HCL 7.5 MG PO TABS
7.5000 mg | ORAL_TABLET | Freq: Three times a day (TID) | ORAL | 3 refills | Status: DC
  Filled 2022-05-29: qty 90, 30d supply, fill #0
  Filled 2022-10-27: qty 90, 30d supply, fill #1
  Filled 2023-01-25: qty 90, 30d supply, fill #2

## 2022-05-29 MED ORDER — ROSUVASTATIN CALCIUM 20 MG PO TABS
20.0000 mg | ORAL_TABLET | Freq: Every day | ORAL | 3 refills | Status: DC
  Filled 2022-05-29 – 2022-05-30 (×2): qty 90, 90d supply, fill #0

## 2022-05-29 MED ORDER — METOPROLOL SUCCINATE ER 50 MG PO TB24
50.0000 mg | ORAL_TABLET | Freq: Two times a day (BID) | ORAL | 3 refills | Status: AC
  Filled 2022-05-29: qty 180, 90d supply, fill #0
  Filled 2022-09-06: qty 180, 90d supply, fill #1
  Filled 2023-02-01 – 2023-02-14 (×2): qty 180, 90d supply, fill #2
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
## 2022-05-30 ENCOUNTER — Other Ambulatory Visit: Payer: Self-pay

## 2022-05-30 ENCOUNTER — Other Ambulatory Visit (HOSPITAL_COMMUNITY): Payer: Self-pay

## 2022-05-31 ENCOUNTER — Other Ambulatory Visit (HOSPITAL_COMMUNITY): Payer: Self-pay

## 2022-06-05 ENCOUNTER — Other Ambulatory Visit (HOSPITAL_COMMUNITY): Payer: Self-pay

## 2022-06-05 ENCOUNTER — Ambulatory Visit: Payer: Medicare Other | Admitting: Physical Therapy

## 2022-06-05 MED ORDER — LAMOTRIGINE 200 MG PO TABS
200.0000 mg | ORAL_TABLET | Freq: Every day | ORAL | 0 refills | Status: DC
  Filled 2022-06-05: qty 90, 90d supply, fill #0

## 2022-06-18 ENCOUNTER — Other Ambulatory Visit: Payer: Self-pay | Admitting: Family Medicine

## 2022-06-18 ENCOUNTER — Other Ambulatory Visit (HOSPITAL_COMMUNITY): Payer: Self-pay

## 2022-06-18 DIAGNOSIS — F17200 Nicotine dependence, unspecified, uncomplicated: Secondary | ICD-10-CM

## 2022-06-18 MED ORDER — PILOCARPINE HCL 7.5 MG PO TABS
7.5000 mg | ORAL_TABLET | Freq: Three times a day (TID) | ORAL | 11 refills | Status: DC
  Filled 2022-06-18: qty 90, 30d supply, fill #0

## 2022-06-19 ENCOUNTER — Ambulatory Visit (INDEPENDENT_AMBULATORY_CARE_PROVIDER_SITE_OTHER): Payer: Medicare Other | Admitting: Obstetrics & Gynecology

## 2022-06-19 ENCOUNTER — Other Ambulatory Visit (HOSPITAL_COMMUNITY): Payer: Self-pay

## 2022-06-19 ENCOUNTER — Encounter: Payer: Self-pay | Admitting: Obstetrics & Gynecology

## 2022-06-19 VITALS — BP 114/82 | HR 68 | Ht 66.25 in | Wt 167.0 lb

## 2022-06-19 DIAGNOSIS — Z9189 Other specified personal risk factors, not elsewhere classified: Secondary | ICD-10-CM | POA: Diagnosis not present

## 2022-06-19 DIAGNOSIS — Z01419 Encounter for gynecological examination (general) (routine) without abnormal findings: Secondary | ICD-10-CM

## 2022-06-19 DIAGNOSIS — N958 Other specified menopausal and perimenopausal disorders: Secondary | ICD-10-CM

## 2022-06-19 DIAGNOSIS — M810 Age-related osteoporosis without current pathological fracture: Secondary | ICD-10-CM | POA: Diagnosis not present

## 2022-06-19 DIAGNOSIS — Z780 Asymptomatic menopausal state: Secondary | ICD-10-CM

## 2022-06-19 DIAGNOSIS — N952 Postmenopausal atrophic vaginitis: Secondary | ICD-10-CM

## 2022-06-19 DIAGNOSIS — Z8739 Personal history of other diseases of the musculoskeletal system and connective tissue: Secondary | ICD-10-CM

## 2022-06-19 MED ORDER — INTRAROSA 6.5 MG VA INST
6.5000 mg | VAGINAL_INSERT | Freq: Every day | VAGINAL | 6 refills | Status: AC
  Filled 2022-06-19: qty 28, 28d supply, fill #0
  Filled 2022-09-06: qty 28, 28d supply, fill #1
  Filled 2023-02-14: qty 28, 28d supply, fill #2

## 2022-06-19 NOTE — Progress Notes (Signed)
Sharon Daniels 03-13-1950 [id number]   History:    72 y.o. G3P2A1L2   RP:  Established patient presenting for annual gyn exam    HPI: Postmenopausal, on no systemic HRT.  No PMB.  No pelvic pain.  Well Intrarosa, helps with IC. Pap smear 04/2018. No significant history of abnormal Pap smears. No indication to repeat a Pap test at this time. Breasts normal. Mammo 05/2019, Lt Dx Mammo/US. Schedule Mammo now. Colonoscopy 05/2022. H/O Osteoporosis, last BD Lt Fem Neck -2.4 in 06/2020 on Prolia. Prolia injection today. Repeat BD now. Fasting Health Labs with Fam MD. BMI improved to 26.75.    Past medical history,surgical history, family history and social history were all reviewed and documented in the EPIC chart.  Gynecologic History No LMP recorded (lmp unknown). Patient is postmenopausal.  Obstetric History OB History  Gravida Para Term Preterm AB Living  3 2 2   1 2   SAB IAB Ectopic Multiple Live Births  1            # Outcome Date GA Lbr Len/2nd Weight Sex Delivery Anes PTL Lv  3 SAB           2 Term           1 Term              ROS: A ROS was performed and pertinent positives and negatives are included in the history. GENERAL: No fevers or chills. HEENT: No change in vision, no earache, sore throat or sinus congestion. NECK: No pain or stiffness. CARDIOVASCULAR: No chest pain or pressure. No palpitations. PULMONARY: No shortness of breath, cough or wheeze. GASTROINTESTINAL: No abdominal pain, nausea, vomiting or diarrhea, melena or bright red blood per rectum. GENITOURINARY: No urinary frequency, urgency, hesitancy or dysuria. MUSCULOSKELETAL: No joint or muscle pain, no back pain, no recent trauma. DERMATOLOGIC: No rash, no itching, no lesions. ENDOCRINE: No polyuria, polydipsia, no heat or cold intolerance. No recent change in weight. HEMATOLOGICAL: No anemia or easy bruising or bleeding. NEUROLOGIC: No headache, seizures, numbness, tingling or weakness. PSYCHIATRIC: No  depression, no loss of interest in normal activity or change in sleep pattern.     Exam:   BP 114/82   Pulse 68   Ht 5' 6.25" (1.683 m)   Wt 167 lb (75.8 kg)   LMP  (LMP Unknown) Comment: not sexually active  SpO2 98%   BMI 26.75 kg/m   Body mass index is 26.75 kg/m.  General appearance : Well developed well nourished female. No acute distress HEENT: Eyes: no retinal hemorrhage or exudates,  Neck supple, trachea midline, no carotid bruits, no thyroidmegaly Lungs: Clear to auscultation, no rhonchi or wheezes, or rib retractions  Heart: Regular rate and rhythm, no murmurs or gallops Breast:Examined in sitting and supine position were symmetrical in appearance, no palpable masses or tenderness,  no skin retraction, no nipple inversion, no nipple discharge, no skin discoloration, no axillary or supraclavicular lymphadenopathy Abdomen: no palpable masses or tenderness, no rebound or guarding Extremities: no edema or skin discoloration or tenderness  Pelvic: Vulva: Normal             Vagina: No gross lesions or discharge  Cervix: No gross lesions or discharge  Uterus  AV, normal size, shape and consistency, non-tender and mobile  Adnexa  Without masses or tenderness  Anus: Normal   Assessment/Plan:  72 y.o. female for annual exam   1. Well female exam with routine gynecological exam Postmenopausal, on  no systemic HRT.  No PMB.  No pelvic pain.  Well Intrarosa, helps with IC. Pap smear 04/2018. No significant history of abnormal Pap smears. No indication to repeat a Pap test at this time. Breasts normal. Mammo 05/2019, Lt Dx Mammo/US. Schedule Mammo now. Colonoscopy 05/2022. H/O Osteoporosis, last BD Lt Fem Neck -2.4 in 06/2020 on Prolia. Prolia injection today. Repeat BD now. Fasting Health Labs with Fam MD. BMI improved to 26.75.  2. Postmenopause Postmenopausal, on no systemic HRT.  No PMB.  No pelvic pain.  3. Genitourinary syndrome of menopause Well on IntraRosa every other day,  helps with IC.  Prescription sent to pharmacy.  4. Other specified personal risk factors, not elsewhere classified  Other orders - sertraline (ZOLOFT) 100 MG tablet - Prasterone (INTRAROSA) 6.5 MG INST; Place 6.5 mg (1 insert) vaginally at bedtime.   Genia Del MD, 11:46 AM

## 2022-06-20 NOTE — Addendum Note (Signed)
Addended by: Genia Del on: 06/20/2022 12:02 PM   Modules accepted: Orders

## 2022-06-21 ENCOUNTER — Other Ambulatory Visit (HOSPITAL_COMMUNITY): Payer: Self-pay

## 2022-07-02 ENCOUNTER — Other Ambulatory Visit (HOSPITAL_COMMUNITY): Payer: Self-pay

## 2022-07-02 MED ORDER — CIPROFLOXACIN HCL 0.3 % OP SOLN
OPHTHALMIC | 0 refills | Status: AC
  Filled 2022-07-02: qty 5, 7d supply, fill #0

## 2022-07-05 ENCOUNTER — Other Ambulatory Visit (HOSPITAL_COMMUNITY): Payer: Self-pay

## 2022-07-05 MED ORDER — LAMOTRIGINE 150 MG PO TABS
150.0000 mg | ORAL_TABLET | Freq: Two times a day (BID) | ORAL | 1 refills | Status: DC
  Filled 2022-07-05: qty 30, 15d supply, fill #0
  Filled 2022-10-27: qty 30, 15d supply, fill #1

## 2022-07-05 MED ORDER — LAMOTRIGINE 100 MG PO TABS
100.0000 mg | ORAL_TABLET | Freq: Every day | ORAL | 1 refills | Status: DC
  Filled 2022-07-05: qty 30, 30d supply, fill #0

## 2022-07-06 ENCOUNTER — Other Ambulatory Visit (HOSPITAL_COMMUNITY): Payer: Self-pay

## 2022-07-10 ENCOUNTER — Other Ambulatory Visit: Payer: Self-pay | Admitting: Obstetrics & Gynecology

## 2022-07-10 ENCOUNTER — Ambulatory Visit
Admission: RE | Admit: 2022-07-10 | Discharge: 2022-07-10 | Disposition: A | Discharge status: Home / Self Care | Payer: Medicare Other | Source: Ambulatory Visit | Attending: Family Medicine | Admitting: Family Medicine

## 2022-07-10 ENCOUNTER — Ambulatory Visit (INDEPENDENT_AMBULATORY_CARE_PROVIDER_SITE_OTHER): Payer: Medicare Other

## 2022-07-10 DIAGNOSIS — Z8739 Personal history of other diseases of the musculoskeletal system and connective tissue: Secondary | ICD-10-CM

## 2022-07-10 DIAGNOSIS — Z13820 Encounter for screening for osteoporosis: Secondary | ICD-10-CM

## 2022-07-10 DIAGNOSIS — Z01419 Encounter for gynecological examination (general) (routine) without abnormal findings: Secondary | ICD-10-CM

## 2022-07-10 DIAGNOSIS — Z9189 Other specified personal risk factors, not elsewhere classified: Secondary | ICD-10-CM

## 2022-07-10 DIAGNOSIS — M810 Age-related osteoporosis without current pathological fracture: Secondary | ICD-10-CM

## 2022-07-10 DIAGNOSIS — Z780 Asymptomatic menopausal state: Secondary | ICD-10-CM

## 2022-07-10 DIAGNOSIS — F17200 Nicotine dependence, unspecified, uncomplicated: Secondary | ICD-10-CM

## 2022-07-10 DIAGNOSIS — N958 Other specified menopausal and perimenopausal disorders: Secondary | ICD-10-CM

## 2022-07-16 ENCOUNTER — Other Ambulatory Visit (HOSPITAL_COMMUNITY): Payer: Self-pay

## 2022-07-16 MED ORDER — WEGOVY 0.25 MG/0.5ML ~~LOC~~ SOAJ
0.2500 mg | SUBCUTANEOUS | 2 refills | Status: AC
  Filled 2022-07-16 – 2022-10-31 (×4): qty 2, 28d supply, fill #0

## 2022-07-16 MED ORDER — OMEPRAZOLE 40 MG PO CPDR
40.0000 mg | DELAYED_RELEASE_CAPSULE | Freq: Every day | ORAL | 1 refills | Status: DC
  Filled 2022-07-16: qty 145, 73d supply, fill #0
  Filled 2022-10-27: qty 145, 73d supply, fill #1

## 2022-07-17 ENCOUNTER — Ambulatory Visit (INDEPENDENT_AMBULATORY_CARE_PROVIDER_SITE_OTHER): Payer: Medicare Other

## 2022-07-17 ENCOUNTER — Ambulatory Visit: Payer: Medicare Other | Attending: Physician Assistant | Admitting: Physician Assistant

## 2022-07-17 ENCOUNTER — Other Ambulatory Visit: Payer: Self-pay | Admitting: Surgery

## 2022-07-17 ENCOUNTER — Ambulatory Visit: Payer: Medicare Other

## 2022-07-17 ENCOUNTER — Encounter: Payer: Self-pay | Admitting: Physician Assistant

## 2022-07-17 VITALS — BP 155/96 | HR 67 | Resp 16 | Ht 66.25 in | Wt 168.4 lb

## 2022-07-17 DIAGNOSIS — Z86711 Personal history of pulmonary embolism: Secondary | ICD-10-CM

## 2022-07-17 DIAGNOSIS — M79641 Pain in right hand: Secondary | ICD-10-CM | POA: Diagnosis not present

## 2022-07-17 DIAGNOSIS — Z8261 Family history of arthritis: Secondary | ICD-10-CM

## 2022-07-17 DIAGNOSIS — R1310 Dysphagia, unspecified: Secondary | ICD-10-CM

## 2022-07-17 DIAGNOSIS — R682 Dry mouth, unspecified: Secondary | ICD-10-CM | POA: Diagnosis not present

## 2022-07-17 DIAGNOSIS — Z86718 Personal history of other venous thrombosis and embolism: Secondary | ICD-10-CM

## 2022-07-17 DIAGNOSIS — K567 Ileus, unspecified: Secondary | ICD-10-CM

## 2022-07-17 DIAGNOSIS — H04123 Dry eye syndrome of bilateral lacrimal glands: Secondary | ICD-10-CM

## 2022-07-17 DIAGNOSIS — M79672 Pain in left foot: Secondary | ICD-10-CM | POA: Diagnosis not present

## 2022-07-17 DIAGNOSIS — I10 Essential (primary) hypertension: Secondary | ICD-10-CM

## 2022-07-17 DIAGNOSIS — M79642 Pain in left hand: Secondary | ICD-10-CM

## 2022-07-17 DIAGNOSIS — M810 Age-related osteoporosis without current pathological fracture: Secondary | ICD-10-CM

## 2022-07-17 DIAGNOSIS — Z860100 Personal history of colon polyps, unspecified: Secondary | ICD-10-CM

## 2022-07-17 DIAGNOSIS — Z96653 Presence of artificial knee joint, bilateral: Secondary | ICD-10-CM | POA: Diagnosis not present

## 2022-07-17 DIAGNOSIS — K760 Fatty (change of) liver, not elsewhere classified: Secondary | ICD-10-CM

## 2022-07-17 DIAGNOSIS — K9189 Other postprocedural complications and disorders of digestive system: Secondary | ICD-10-CM

## 2022-07-17 DIAGNOSIS — M79671 Pain in right foot: Secondary | ICD-10-CM

## 2022-07-17 DIAGNOSIS — Z8719 Personal history of other diseases of the digestive system: Secondary | ICD-10-CM

## 2022-07-17 DIAGNOSIS — E559 Vitamin D deficiency, unspecified: Secondary | ICD-10-CM

## 2022-07-17 DIAGNOSIS — Z87442 Personal history of urinary calculi: Secondary | ICD-10-CM

## 2022-07-17 NOTE — Progress Notes (Signed)
Office Visit Note  Patient: Sharon Daniels             Date of Birth: Mar 25, 1950           MRN: [id number]             PCP: Lewis Moccasin, MD Referring: Lewis Moccasin, MD Visit Date: 07/17/2022 Occupation: @GUAROCC @  Subjective:  Dry mouth and dry eyes   History of Present Illness: Sharon Daniels is a 72 y.o. female who presents today for a new patient consultation.  Patient states that she has experienced intermittent arthralgias, joint stiffness, and intermittent joint swelling since her late 61s.  She has been a patient at [facility] for many years.  She has had both knees replaced by Dr. Antony Odea in the past.  She has also had carpal tunnel release bilaterally and has been having CMC joint injections performed every few months.  She takes Aleve as needed for pain relief and occasionally uses Voltaren gel.  Patient states that her joint pain is worse in the winter months and typically is less frequent and less severe during the summer months.  Patient states that her father had rheumatoid arthritis but she denies any other family history of any rheumatologic conditions. Patient states that about 2-1/2 years ago she started to experience dry mouth.  Patient attributed her dry mouth to aging as well as medications but states that her symptoms of dry mouth have become more severe over time.  She states that within the past 12 months she has had 5 teeth require dental work under the care of an endodontist.  Patient states that she was started on pilocarpine 7.5 mg 1 tablet 3 times daily as needed for symptomatic relief by her PCP Dr. Duanne Guess.  Patient states that if she takes pilocarpine at night she has excessive drooling so she has been taking pilocarpine twice daily which has been helpful.  Patient states that about 2 years ago she also started to experience increased dry eyes.  She is under the care of ophthalmologist to pointed out her eye dryness.  She has been using  Systane eyedrops twice daily. Patient states that she has occasional sores in her nose but denies any oral ulcers.  She denies any swollen lymph nodes or parotid swelling or pain.  She denies any history of salivary stones.  Patient states she wakes up in the morning with a sore throat but denies any hoarseness throughout the day.  She denies any symptoms of Raynaud's phenomenon.  She denies any recent or recurrent rashes. Patient states that she has been on Prolia for the past 3 to 4 years.  She states that she had a history of a left ankle fracture as well as a left wrist fracture in the past.    Activities of Daily Living:  Patient reports morning stiffness for a few minutes.   Patient Denies nocturnal pain.  Difficulty dressing/grooming: Denies Difficulty climbing stairs: Denies Difficulty getting out of chair: Denies Difficulty using hands for taps, buttons, cutlery, and/or writing: Reports  Review of Systems  Constitutional:  Positive for fatigue.  HENT:  Positive for sore throat and mouth dryness. Negative for mouth sores.   Eyes:  Positive for dryness.  Respiratory:  Negative for shortness of breath.   Cardiovascular:  Negative for chest pain and palpitations.  Gastrointestinal:  Positive for constipation. Negative for blood in stool and diarrhea.  Endocrine: Negative for increased urination.  Genitourinary:  Negative for involuntary  urination.  Musculoskeletal:  Positive for joint pain, joint pain, joint swelling, muscle weakness and morning stiffness. Negative for gait problem, myalgias, muscle tenderness and myalgias.  Skin:  Negative for color change, rash, hair loss and sensitivity to sunlight.  Allergic/Immunologic: Negative for susceptible to infections.  Neurological:  Positive for numbness, headaches and parasthesias. Negative for dizziness.  Hematological:  Negative for swollen glands.  Psychiatric/Behavioral:  Positive for depressed mood. Negative for sleep disturbance.  The patient is not nervous/anxious.     PMFS History:  Patient Active Problem List   Diagnosis Date Noted   Hx of colonic polyp 01/15/2022   Dysphagia 01/15/2022   Gastroesophageal reflux disease 01/15/2022   Breast hypertrophy 08/04/2015   Osteoporosis 05/12/2015   History of colonic polyps 04/21/2015   DVT, lower extremity, distal, acute (HCC) 06/17/2013   Ileus, postoperative (HCC) 06/17/2013   Hypokalemia 06/17/2013   Acute pulmonary embolism (HCC) 06/13/2013   Postoperative anemia due to acute blood loss 06/09/2013   Transfusion history 06/09/2013   HIATAL HERNIA 06/18/2007   FATTY LIVER DISEASE 06/18/2007    Past Medical History:  Diagnosis Date   Acute pulmonary embolism (HCC) 06/13/2013   CT angio chest 06/12/13   ADHD    Alcohol abuse    Allergy    Anemia    Anxiety    Arthritis    Back pain    Broken wrist    right above wrist   Clotting disorder (HCC)    Complication of anesthesia    "during breast surgery: could hear the surgery, wake up too soon, anxious   Constipation    COPD (chronic obstructive pulmonary disease) (HCC)    Depression    hx of   DVT (deep venous thrombosis) (HCC) 2015   Fatty liver    Frequent sinus infections    GERD (gastroesophageal reflux disease)    H/O bronchitis    Hard of hearing    High cholesterol    History of kidney stones    x3   Hypertension    Joint pain    Osteoporosis    Pneumonia    hx of 10 years ago   PONV (postoperative nausea and vomiting)    "mild nausea"   Post-menopausal    Prediabetes    Stress incontinence    Substance abuse (HCC)    Vitamin D deficiency    Wears glasses     Family History  Problem Relation Age of Onset   Cancer Mother    Hyperlipidemia Mother    Hypertension Mother    Kidney disease Mother    Atrial fibrillation Mother    Breast cancer Mother    Ovarian cancer Mother    Heart disease Mother    Depression Mother    Alcohol abuse Mother    Osteoporosis Mother     Arthritis Mother    Heart disease Father    Hyperlipidemia Father    Hypertension Father    Heart attack Father    Epilepsy Daughter    Healthy Daughter    Healthy Son    Colon cancer Neg Hx    Esophageal cancer Neg Hx    Stomach cancer Neg Hx    Rectal cancer Neg Hx    Past Surgical History:  Procedure Laterality Date   ABDOMINOPLASTY  09/2015   BREAST REDUCTION SURGERY  08/04/2015   BREAST REDUCTION SURGERY Bilateral 08/04/2015   Procedure: MAMMARY REDUCTION  (BREAST);  Surgeon: Etter Sjogren, MD;  Location: [facility] OR;  Service:  Plastics;  Laterality: Bilateral;   BREAST SURGERY  1985   abscess   CARPAL TUNNEL RELEASE Left    CESAREAN SECTION  1989, 1992   COLONOSCOPY     COSMETIC SURGERY  2013   in the office   COSMETIC SURGERY     ear lobes shorten   CYSTECTOMY     HAMMERTOE RECONSTRUCTION WITH WEIL OSTEOTOMY Right 06/26/2018   Procedure: Right 2nd toe hammertoe correction; 2nd metatarsal Weil osteotomy;  Surgeon: Toni Arthurs, MD;  Location:  SURGERY CENTER;  Service: Orthopedics;  Laterality: Right;    NASAL SINUS SURGERY  2000   ORIF ANKLE FRACTURE Left 10/28/2018   Procedure: OPEN REDUCTION INTERNAL FIXATION (ORIF) LEFT ANKLE BIMALLEOLAR FRACTURE DISLOCATION;  Surgeon: Toni Arthurs, MD;  Location: [facility] OR;  Service: Orthopedics;  Laterality: Left;    TOTAL KNEE ARTHROPLASTY Bilateral 06/05/2013   Procedure: BILATERAL TOTAL KNEE ARTHROPLASTY;  Surgeon: Loanne Drilling, MD;  Location: [facility] ORS;  Service: Orthopedics;  Laterality: Bilateral;   Social History   Social History Narrative   Not on file   Immunization History  Administered Date(s) Administered   Moderna Sars-Covid-2 Vaccination 05/25/2020   PFIZER(Purple Top)SARS-COV-2 Vaccination 03/17/2019, 04/07/2019, 09/23/2019     Objective: Vital Signs: BP (!) 155/96 (BP Location: Left Arm, Patient Position: Sitting, Cuff Size: Normal)   Pulse 67   Resp 16   Ht 5' 6.25" (1.683 m)   Wt 168 lb 6.4  oz (76.4 kg)   LMP  (LMP Unknown) Comment: not sexually active  BMI 26.98 kg/m    Physical Exam Vitals and nursing note reviewed.  Constitutional:      Appearance: She is well-developed.  HENT:     Head: Normocephalic and atraumatic.     Mouth/Throat:     Comments: No parotid swelling or tenderness  Eyes:     Conjunctiva/sclera: Conjunctivae normal.  Cardiovascular:     Rate and Rhythm: Normal rate and regular rhythm.     Heart sounds: Normal heart sounds.  Pulmonary:     Effort: Pulmonary effort is normal.     Breath sounds: Normal breath sounds.  Abdominal:     General: Bowel sounds are normal.     Palpations: Abdomen is soft.  Musculoskeletal:     Cervical back: Normal range of motion.  Lymphadenopathy:     Cervical: No cervical adenopathy.  Skin:    General: Skin is warm and dry.     Capillary Refill: Capillary refill takes less than 2 seconds.  Neurological:     Mental Status: She is alert and oriented to person, place, and time.  Psychiatric:        Behavior: Behavior normal.      Musculoskeletal Exam: C-spine, thoracic spine, lumbar spine have good range of motion.  Shoulder joints, elbow joints, wrist joints, MCPs, PIPs, DIPs have good range of motion with no synovitis.  Tenderness over both CMC joints.  Prominence of the left CMC joint noted.  No tenderness or synovitis over MCP joints.  PIP and DIP thickening consistent with osteoarthritis of both hands.  Mucinous cyst noted over the right third digit at the DIP joint.  Complete fist formation bilaterally.  Hip joints have good range of motion with no groin pain.  Bilateral knee replacements have good range of motion with no warmth or effusion.  Ankle joints have good range of motion with no tenderness or synovitis.  No tenderness or synovitis over MTP joints.  Some PIP and DIP thickening  consistent with osteoarthritis of both feet noted.  No evidence of Achilles tendinitis or plantar fasciitis.  CDAI Exam: CDAI  Score: -- Patient Global: --; Provider Global: -- Swollen: --; Tender: -- Joint Exam 07/17/2022   No joint exam has been documented for this visit   There is currently no information documented on the homunculus. Go to the Rheumatology activity and complete the homunculus joint exam.  Investigation: No additional findings.  Imaging: CT CHEST LUNG CA SCREEN LOW DOSE W/O CM  Result Date: 07/13/2022 CLINICAL DATA:  72 year old female current smoker with 32 pack-year history of smoking. Lung cancer screening examination. EXAM: CT CHEST WITHOUT CONTRAST LOW-DOSE FOR LUNG CANCER SCREENING TECHNIQUE: Multidetector CT imaging of the chest was performed following the standard protocol without IV contrast. RADIATION DOSE REDUCTION: This exam was performed according to the departmental dose-optimization program which includes automated exposure control, adjustment of the mA and/or kV according to patient size and/or use of iterative reconstruction technique. COMPARISON:  Low-dose lung cancer screening chest CT 08/08/2020. FINDINGS: Cardiovascular: Heart size is normal. There is no significant pericardial fluid, thickening or pericardial calcification. There is aortic atherosclerosis, as well as atherosclerosis of the great vessels of the mediastinum and the coronary arteries, including calcified atherosclerotic plaque in the left main, left anterior descending, left circumflex and right coronary arteries. Mediastinum/Nodes: No pathologically enlarged mediastinal or hilar lymph nodes. Please note that accurate exclusion of hilar adenopathy is limited on noncontrast CT scans. Esophagus is unremarkable in appearance. No axillary lymphadenopathy. Lungs/Pleura: Multiple small pulmonary nodules are noted in the lungs, largest of which is in the base of the left lower lobe (axial image 221), with a volume derived mean diameter 4.6 mm. No larger more suspicious appearing pulmonary nodules or masses are noted. No acute  consolidative airspace disease. No pleural effusions. Mild diffuse bronchial wall thickening with mild centrilobular and paraseptal emphysema. Upper Abdomen: Aortic atherosclerosis. Musculoskeletal: There are no aggressive appearing lytic or blastic lesions noted in the visualized portions of the skeleton. IMPRESSION: 1. Lung-RADS 2S, benign appearance or behavior. Continue annual screening with low-dose chest CT without contrast in 12 months. 2. The "S" modifier above refers to potentially clinically significant non lung cancer related findings. Specifically, there is aortic atherosclerosis, in addition to left main and three-vessel coronary artery disease. Please note that although the presence of coronary artery calcium documents the presence of coronary artery disease, the severity of this disease and any potential stenosis cannot be assessed on this non-gated CT examination. Assessment for potential risk factor modification, dietary therapy or pharmacologic therapy may be warranted, if clinically indicated. 3. Mild diffuse bronchial wall thickening with mild centrilobular and paraseptal emphysema; imaging findings suggestive of underlying COPD. Aortic Atherosclerosis (ICD10-I70.0) and Emphysema (ICD10-J43.9). Electronically Signed   By: Trudie Reed M.D.   On: 07/13/2022 07:37    Recent Labs: Lab Results  Component Value Date   WBC 6.5 08/09/2020   HGB 12.3 08/09/2020   PLT 242 08/09/2020   NA 141 08/09/2020   K 4.9 08/09/2020   CL 102 08/09/2020   CO2 25 08/09/2020   GLUCOSE 109 (H) 08/09/2020   BUN 15 08/09/2020   CREATININE 0.83 08/09/2020   BILITOT 0.3 08/09/2020   ALKPHOS 69 08/09/2020   AST 26 08/09/2020   ALT 24 08/09/2020   PROT 7.2 08/09/2020   ALBUMIN 4.8 08/09/2020   CALCIUM 9.8 03/22/2022   GFRAA >60 10/20/2018    Speciality Comments: No specialty comments available.  Procedures:  No procedures performed  Allergies: Morphine   Assessment / Plan:     Visit  Diagnoses: Dry mouth - x2.5 years: Patient presents today for new patient consultation for further evaluation of chronic dry mouth and chronic dry eyes.  Patient's symptoms of dry mouth initially started 2-1/2 years ago with no identifiable trigger.  Her symptoms have progressively been worsening to the point that she has had to establish care with an endodontist and has had dental work on 5 teeth in the last 12 months.  Her PCP, Dr. Duanne Guess, initiated the patient on pilocarpine 7.5 mg 1 tablet by mouth TID 2 months ago.  The patient has reduced pilocarpine to twice daily due to excessive drooling at night.  She has no history of salivary stones, parotid swelling, or parotid tenderness.  No cervical lymphadenopathy.  Her symptoms of dry mouth have improved since initiating pilocarpine. Patient started to experience symptoms of dry eyes about 2 years ago.  She has been under the care of ophthalmology who also remarked on the dryness of her eyes.  She has been using Systane twice daily. The patient has family history of rheumatoid arthritis--father--but no other rheumatologic conditions. Patient has been experiencing intermittent arthralgias and joint stiffness since her late 25s.  She has been under the care of [facility].  Her arthralgias are typically worse during the winter months.  She has been taking Aleve as needed for pain relief and uses Voltaren gel sparingly.  Her knee joint pain has resolved since having bilateral knee joints replaced.  She has no synovitis on examination today.  She has tenderness over both CMC joints with prominence in the left [facility] joint.  PIP and DIP thickening consistent with osteoarthritis of both hands and both feet noted.  X-rays of both hands and feet will be obtained today for further evaluation.  The following lab work will also be obtained.  Briefly discussed the diagnosis of Sjogren's syndrome.  We will review x-ray and lab results at her new patient follow-up visit  and we will further discuss the next steps at that time. - Plan: ANA, Sjogrens syndrome-A extractable nuclear antibody, Sjogrens syndrome-B extractable nuclear antibody, Rheumatoid factor, Serum protein electrophoresis with reflex, Sedimentation rate, C3 and C4, Urinalysis, Routine w reflex microscopic, Anti-scleroderma antibody, RNP Antibody, Anti-Smith antibody  Dry eyes -x2 years.  Using systane eyedrops twice daily.  Under the care of ophthalmology.  The following lab work will be obtained today for further evaluation.  Plan: ANA, Sjogrens syndrome-A extractable nuclear antibody, Sjogrens syndrome-B extractable nuclear antibody, Rheumatoid factor, Serum protein electrophoresis with reflex, Sedimentation rate, C3 and C4, Urinalysis, Routine w reflex microscopic, Anti-scleroderma antibody, RNP Antibody, Anti-Smith antibody  Pain in both hands - Patient experiences intermittent pain and stiffness in both hands.  The discomfort in her hands is worse during the winter months.  She has noticed intermittent joint swelling.  She has been having CMC joint injections performed every few months, which provide temporary relief.  She is not yet ready to proceed with surgery. She has been under the care of [facility].  History of carpal tunnel release bilaterally.  Left hand dominant.  History of rheumatoid arthritis: Father. No synovitis noted on examination today. X-rays of both hands obtained today for further evaluation.   The following lab work will also be obtained.  plan: XR Hand 2 View Right, XR Hand 2 View Left, Cyclic citrul peptide antibody, IgG  Pain in both feet -She has PIP and DIP thickening consistent with OA of both  feet.  X-rays of both feet obtained today.  Plan: XR Foot 2 Views Right, XR Foot 2 Views Left, Cyclic citrul peptide antibody, IgG  Family history of rheumatoid arthritis -Father.  The following lab work will be obtained today for further evaluation.  Plan: Rheumatoid factor,  Cyclic citrul peptide antibody, IgG  Status post bilateral knee replacements: Performed by Dr. Lequita Halt. Doing well.  Good ROM with no discomfort.  No warmth or effusion.   Age-related osteoporosis without current pathological fracture: DEXA updated on 07/10/2022: LFN BMD 0.586 with T-score -2.4.  Patient has been on Prolia 60 mg subcu injections for the past 3 to 4 years.  History of left ankle fracture and left wrist fracture.  Family history of osteoporosis.  Vitamin D deficiency: She is taking vitamin D 5000 units daily.  Other medical conditions are listed as follows:  History of DVT (deep vein thrombosis)  History of pulmonary embolism - 2015  History of gastroesophageal reflux (GERD)  Fatty liver  Ileus, postoperative (HCC)  Hx of colonic polyp  Essential hypertension:  BP was 155/96 today in the office. BP was rechecked prior to leaving.   History of nephrolithiasis    Orders: Orders Placed This Encounter  Procedures   XR Hand 2 View Right   XR Hand 2 View Left   XR Foot 2 Views Right   XR Foot 2 Views Left   ANA   Sjogrens syndrome-A extractable nuclear antibody   Sjogrens syndrome-B extractable nuclear antibody   Rheumatoid factor   Serum protein electrophoresis with reflex   Sedimentation rate   C3 and C4   Urinalysis, Routine w reflex microscopic   Anti-scleroderma antibody   RNP Antibody   Anti-Smith antibody   Cyclic citrul peptide antibody, IgG   No orders of the defined types were placed in this encounter.   Follow-Up Instructions: Return for NPFU.   Gearldine Bienenstock, PA-C  Note - This record has been created using Dragon software.  Chart creation errors have been sought, but may not always  have been located. Such creation errors do not reflect on  the standard of medical care.

## 2022-07-18 ENCOUNTER — Other Ambulatory Visit (HOSPITAL_COMMUNITY): Payer: Self-pay

## 2022-07-18 NOTE — Progress Notes (Signed)
Results will be discussed at NPFU

## 2022-07-20 LAB — MICROSCOPIC MESSAGE

## 2022-07-20 LAB — PROTEIN ELECTROPHORESIS, SERUM, WITH REFLEX
Albumin ELP: 4 g/dL (ref 3.8–4.8)
Alpha 1: 0.3 g/dL (ref 0.2–0.3)
Alpha 2: 1 g/dL — ABNORMAL HIGH (ref 0.5–0.9)
Beta 2: 0.3 g/dL (ref 0.2–0.5)
Beta Globulin: 0.5 g/dL (ref 0.4–0.6)
Gamma Globulin: 0.8 g/dL (ref 0.8–1.7)
Total Protein: 7 g/dL (ref 6.1–8.1)

## 2022-07-20 LAB — URINALYSIS, ROUTINE W REFLEX MICROSCOPIC
Bacteria, UA: NONE SEEN /HPF
Bilirubin Urine: NEGATIVE
Glucose, UA: NEGATIVE
Hgb urine dipstick: NEGATIVE
Hyaline Cast: NONE SEEN /LPF
Ketones, ur: NEGATIVE
Nitrite: NEGATIVE
Protein, ur: NEGATIVE
RBC / HPF: NONE SEEN /HPF (ref 0–2)
Specific Gravity, Urine: 1.023 (ref 1.001–1.035)
WBC, UA: NONE SEEN /HPF (ref 0–5)
pH: 5.5 (ref 5.0–8.0)

## 2022-07-20 LAB — ANTI-NUCLEAR AB-TITER (ANA TITER): ANA Titer 1: 1:40 {titer} — ABNORMAL HIGH

## 2022-07-20 LAB — RNP ANTIBODY: Ribonucleic Protein(ENA) Antibody, IgG: <1 AI

## 2022-07-20 LAB — SJOGRENS SYNDROME-A EXTRACTABLE NUCLEAR ANTIBODY: SSA (Ro) (ENA) Antibody, IgG: <1 AI

## 2022-07-20 LAB — C3 AND C4
C3 Complement: 159 mg/dL (ref 83–193)
C4 Complement: 29 mg/dL (ref 15–57)

## 2022-07-20 LAB — SJOGRENS SYNDROME-B EXTRACTABLE NUCLEAR ANTIBODY: SSB (La) (ENA) Antibody, IgG: <1 AI

## 2022-07-20 LAB — ANA: Anti Nuclear Antibody (ANA): POSITIVE — AB

## 2022-07-20 LAB — ANTI-SMITH ANTIBODY: ENA SM Ab Ser-aCnc: <1 AI

## 2022-07-20 LAB — CYCLIC CITRUL PEPTIDE ANTIBODY, IGG: Cyclic Citrullin Peptide Ab: <16 UNITS

## 2022-07-20 LAB — ANTI-SCLERODERMA ANTIBODY: Scleroderma (Scl-70) (ENA) Antibody, IgG: <1 AI

## 2022-07-20 LAB — SEDIMENTATION RATE: Sed Rate: 28 mm/h (ref 0–30)

## 2022-07-20 LAB — RHEUMATOID FACTOR: Rheumatoid fact SerPl-aCnc: <10 IU/mL (ref <14)

## 2022-07-23 ENCOUNTER — Other Ambulatory Visit: Payer: Medicare Other

## 2022-07-24 ENCOUNTER — Encounter: Payer: Self-pay | Admitting: Cardiology

## 2022-07-24 ENCOUNTER — Ambulatory Visit: Payer: Medicare Other | Admitting: Cardiology

## 2022-07-24 ENCOUNTER — Other Ambulatory Visit (HOSPITAL_COMMUNITY): Payer: Self-pay

## 2022-07-24 VITALS — BP 165/82 | HR 72 | Resp 16 | Ht 66.0 in | Wt 169.6 lb

## 2022-07-24 DIAGNOSIS — E7800 Pure hypercholesterolemia, unspecified: Secondary | ICD-10-CM

## 2022-07-24 DIAGNOSIS — R9439 Abnormal result of other cardiovascular function study: Secondary | ICD-10-CM

## 2022-07-24 DIAGNOSIS — I25118 Atherosclerotic heart disease of native coronary artery with other forms of angina pectoris: Secondary | ICD-10-CM

## 2022-07-24 DIAGNOSIS — I10 Essential (primary) hypertension: Secondary | ICD-10-CM

## 2022-07-24 MED ORDER — VALSARTAN-HYDROCHLOROTHIAZIDE 320-12.5 MG PO TABS
1.0000 | ORAL_TABLET | ORAL | 0 refills | Status: DC
Start: 2022-07-24 — End: 2022-09-17
  Filled 2022-07-24 – 2022-09-06 (×2): qty 90, 90d supply, fill #0

## 2022-07-24 MED ORDER — HYDROCHLOROTHIAZIDE 12.5 MG PO CAPS
12.5000 mg | ORAL_CAPSULE | ORAL | 0 refills | Status: DC
  Filled 2022-07-24: qty 90, 90d supply, fill #0

## 2022-07-24 NOTE — Progress Notes (Signed)
Primary Physician/Referring:  Lewis Moccasin, MD  Patient ID: Sharon Daniels, female    DOB: Nov 04, 1950, 72 y.o.   MRN: [id number]  Chief Complaint  Patient presents with   Follow-up   Abnormal CT   HPI:    Sharon Daniels  is a 72 y.o.  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  She denies any leg edema, no dizziness or syncope.  She is tolerating all medications well she has very mild chronic dyspnea but essentially asymptomatic.  States that with weight loss she is feeling well overall.  Past Medical History:  Diagnosis Date   Acute pulmonary embolism (HCC) 06/13/2013   CT angio chest 06/12/13   ADHD    Alcohol abuse    Allergy    Anemia    Anxiety    Arthritis    Back pain    Broken wrist    right above wrist   Clotting disorder (HCC)    Complication of anesthesia    "during breast surgery: could hear the surgery, wake up too soon, anxious   Constipation    COPD (chronic obstructive pulmonary disease) (HCC)    Depression    hx of   DVT (deep venous thrombosis) (HCC) 2015   Fatty liver    Frequent sinus infections    GERD (gastroesophageal reflux disease)    H/O bronchitis    Hard of hearing    High cholesterol    History of kidney stones    x3   Hypertension    Joint pain    Osteoporosis    Pneumonia    hx of 10 years ago   PONV (postoperative nausea and vomiting)    "mild nausea"   Post-menopausal    Prediabetes    Stress incontinence    Substance abuse (HCC)    Vitamin D deficiency    Wears glasses    Past Surgical History:  Procedure Laterality Date   ABDOMINOPLASTY  09/2015   BREAST REDUCTION SURGERY  08/04/2015    BREAST REDUCTION SURGERY Bilateral 08/04/2015   Procedure: MAMMARY REDUCTION  (BREAST);  Surgeon: Etter Sjogren, MD;  Location: [facility];  Service: Plastics;  Laterality: Bilateral;   BREAST SURGERY  1985   abscess   CARPAL TUNNEL RELEASE Left    CESAREAN SECTION  1989, 1992   COLONOSCOPY     COSMETIC SURGERY  2013   in the office   COSMETIC SURGERY     ear lobes shorten   CYSTECTOMY     HAMMERTOE RECONSTRUCTION WITH WEIL OSTEOTOMY Right 06/26/2018   Procedure: Right 2nd toe hammertoe correction; 2nd metatarsal Weil osteotomy;  Surgeon: Toni Arthurs, MD;  Location: [facility];  Service: Orthopedics;  Laterality: Right;    NASAL SINUS SURGERY  2000   ORIF ANKLE FRACTURE Left 10/28/2018   Procedure: OPEN REDUCTION INTERNAL FIXATION (ORIF) LEFT ANKLE BIMALLEOLAR FRACTURE DISLOCATION;  Surgeon: Toni Arthurs, MD;  Location: [facility] OR;  Service: Orthopedics;  Laterality: Left;    TOTAL KNEE ARTHROPLASTY Bilateral 06/05/2013   Procedure: BILATERAL TOTAL KNEE ARTHROPLASTY;  Surgeon: Loanne Drilling, MD;  Location: [facility];  Service: Orthopedics;  Laterality: Bilateral;   Social History   Tobacco Use  Smoking status: Former    Current packs/day: 0.00    Average packs/day: 1 pack/day for 30.0 years (30.0 ttl pk-yrs)    Types: Cigarettes    Start date: 10/20/1987    Quit date: 10/19/2017    Years since quitting: 4.7    Passive exposure: Past   Smokeless tobacco: Never  Substance Use Topics   Alcohol use: Not Currently   Marital Status: Divorced  ROS  Review of Systems  Cardiovascular:  Positive for dyspnea on exertion. Negative for chest pain and leg swelling.  Gastrointestinal:  Negative for melena.   Objective  Blood pressure (!) 165/82, pulse 72, resp. rate 16, height 5\' 6"  (1.676 m), weight 169 lb 9.6 oz (76.9 kg), SpO2 96%. Body mass index is 27.37 kg/m.     07/24/2022   11:38 AM 07/17/2022   11:03 AM 07/17/2022   11:02 AM  Vitals with BMI  Height 5\' 6"      Weight 169 lbs 10 oz    BMI 27.39    Systolic 165 155 562  Diastolic 82 96 94  Pulse 72 67      Physical Exam Vitals reviewed.  Neck:     Vascular: No carotid bruit or JVD.  Cardiovascular:     Rate and Rhythm: Normal rate and regular rhythm.     Pulses: Intact distal pulses.     Heart sounds: Normal heart sounds, S1 normal and S2 normal. No murmur heard.    No gallop.  Pulmonary:     Effort: Pulmonary effort is normal.     Breath sounds: Examination of the right-lower field reveals rhonchi. Examination of the left-lower field reveals rhonchi. Rhonchi present.  Musculoskeletal:     Right lower leg: No edema.     Left lower leg: No edema.     Laboratory examination:   Lab Results  Component Value Date   NA 141 08/09/2020   K 4.9 08/09/2020   CO2 25 08/09/2020   GLUCOSE 109 (H) 08/09/2020   BUN 15 08/09/2020   CREATININE 0.83 08/09/2020   CALCIUM 9.8 03/22/2022   EGFR 77 08/09/2020   GFRNONAA >60 10/20/2018       Latest Ref Rng & Units 07/17/2022   11:51 AM 03/22/2022    2:01 PM 08/09/2020    9:42 AM  CMP  Glucose 65 - 99 mg/dL   130   BUN 8 - 27 mg/dL   15   Creatinine 8.65 - 1.00 mg/dL   7.84   Sodium 696 - 295 mmol/L   141   Potassium 3.5 - 5.2 mmol/L   4.9   Chloride 96 - 106 mmol/L   102   CO2 20 - 29 mmol/L   25   Calcium 8.6 - 10.4 mg/dL  9.8  28.4   Total Protein 6.1 - 8.1 g/dL 7.0   7.2   Total Bilirubin 0.0 - 1.2 mg/dL   0.3   Alkaline Phos 44 - 121 IU/L   69   AST 0 - 40 IU/L   26   ALT 0 - 32 IU/L   24       Latest Ref Rng & Units 08/09/2020    9:42 AM 10/20/2018   12:45 PM 07/27/2015    1:42 PM  CBC  WBC 3.4 - 10.8 x10E3/uL 6.5  11.2  7.6   Hemoglobin 11.1 - 15.9 g/dL 13.2  44.0  10.2   Hematocrit 34.0 - 46.6 % 37.8  33.9  38.1   Platelets 150 -  450 x10E3/uL 242  235  289   Lipid Panel     Component Value Date/Time   CHOL 146 08/09/2020 0942   TRIG 79 08/09/2020 0942   HDL 61 08/09/2020 0942   CHOLHDL 2.4 08/09/2020 0942   LDLCALC 70  08/09/2020 0942   LABVLDL 15 08/09/2020 0942    HEMOGLOBIN A1C Lab Results  Component Value Date   HGBA1C 6.0 (H) 08/09/2020   External labs:   Labs 08/25/2021:  Serum glucose 94 mg, sodium 142, potassium 4.3, BUN 17, creatinine 1.0, EGFR 60 mL, LFTs normal.  Total cholesterol 115, triglycerides 117, HDL 59, LDL 33.  A1c 6.0%.  TSH 1.70, normal.    Cholesterol, total 151.000 M 07/16/2019 HDL 53.000 MG 07/16/2019 LDL 85.000 MG 07/16/2019 Triglycerides 67.000 MG 07/16/2019  Radiology:   Lung cancer screening CT 07/13/2022: 1. Cardiovascular: Heart size is normal. There is no significant pericardial fluid, thickening or pericardial calcification. There is aortic atherosclerosis, as well as atherosclerosis of the great vessels of the mediastinum and the coronary arteries, including calcified atherosclerotic plaque in the left main, left anterior descending, left circumflex and right coronary arteries. 2. Lung-RADS 2S, benign appearance or behavior. Continue annual screening with low-dose chest CT without contrast in 12 months. 3. Mild diffuse bronchial wall thickening with mild centrilobular and paraseptal emphysema; imaging findings suggestive of underlying COPD  Cardiac Studies:   Coronary calcium score 06/21/2020: LM: 50 LAD: 302 LCx: 7 RCA: 322 Total at Agatston score 682.  MESA database percentile 96.  Ascending and descending thoracic aortic measurements normal.  Aortic atherosclerosis.  Visualized lung fields do not suggest any significant abnormality.  PCV ECHOCARDIOGRAM COMPLETE 08/01/2020 Left ventricle cavity is normal in size. Normal left ventricular wall thickness. Normal global wall motion. Doppler evidence of grade I (impaired) diastolic dysfunction, normal LAP. Normal LV systolic function with visual EF 55-60%. Calculated EF 55%. Left atrial cavity is mildly dilated at 4.3 cm. MV appears normal. Normal thickness. Cannot r/o MVP. Moderate to severe anteriorly directed mitral  regurgitation. Structurally normal tricuspid valve. No evidence of tricuspid stenosis. Mild tricuspid regurgitation. No evidence of pulmonary hypertension.   PCV MYOCARDIAL PERFUSION WO LEXISCAN 08/01/2020 Exercise nuclear stress test was performed using Bruce protocol. Patient reached 7.8 METS, and 88% of age predicted maximum heart rate. Exercise capacity was fair. No chest pain reported. Heart rate and hemodynamic response were normal. Stress EKG revealed no ischemic changes. SPECT images show decreased tracer uptake in inferior myocardium in rest and stress images, likely due to breast attenuation imaging performed in sitting position. In addition, there is a very small sized mild intensity, reversible perfusion defect in apical inferolateral myocardium. Stress LVEF calculated as 43%, although visually appears normal. Low risk study.  EKG:   EKG 07/24/2022: Normal sinus rhythm at rate of 62 bpm, normal EKG.  No change from 11/16/2021.   Medications and allergies   Allergies  Allergen Reactions   Morphine Itching, Anxiety and Rash    IV only, made her mean    Current Outpatient Medications:    albuterol (VENTOLIN HFA) 108 (90 Base) MCG/ACT inhaler, Inhale 2 puffs into the lungs every 4 to 6 hours as needed, Disp: 6.7 g, Rfl: 2   Ascorbic Acid (VITAMIN C) 500 MG CAPS, Take 1 capsule by mouth daily., Disp: , Rfl:    aspirin 81 MG EC tablet, Take 81 mg by mouth daily. Swallow whole., Disp: , Rfl:    atomoxetine (STRATTERA) 80 MG capsule, Take 1 capsule (80 mg  total) by mouth in the morning., Disp: 90 capsule, Rfl: 1   azelastine (ASTELIN) 0.1 % nasal spray, Place 2 sprays into both nostrils 2 (two) times daily., Disp: 30 mL, Rfl: 0   BLACK COHOSH PO, Take 1 capsule by mouth daily., Disp: , Rfl:    buPROPion (WELLBUTRIN XL) 300 MG 24 hr tablet, Take 1 tablet (300 mg total) by mouth in the morning., Disp: 90 tablet, Rfl: 3   Cholecalciferol (VITAMIN D3) 1000 UNITS CAPS, Take 5,000 Units by  mouth daily., Disp: , Rfl:    diclofenac Sodium (VOLTAREN) 1 % GEL, Apply 4 g topically to affected area 4 (four) times daily., Disp: 500 g, Rfl: 11   hydrochlorothiazide (MICROZIDE) 12.5 MG capsule, Take 1 capsule (12.5 mg total) by mouth every morning., Disp: 90 capsule, Rfl: 0   Insulin Pen Needle (TECHLITE PEN NEEDLES) 31G X 6 MM MISC, Use a pen needle to inject Victoza under the skin once daily, Disp: 200 each, Rfl: 0   lamoTRIgine (LAMICTAL) 100 MG tablet, Take 1 tablet (100 mg total) by mouth daily., Disp: , Rfl:    lamoTRIgine (LAMICTAL) 150 MG tablet, Take 1 tablet (150 mg total) by mouth 2 (two) times daily. (Patient taking differently: Take 150 mg by mouth 2 (two) times daily. (Total 250 mg twice daily)), Disp: 30 tablet, Rfl: 1   metoprolol succinate (TOPROL-XL) 50 MG 24 hr tablet, Take 1 tablet (50 mg total) by mouth 2 (two) times daily., Disp: 180 tablet, Rfl: 3   montelukast (SINGULAIR) 10 MG tablet, Take 1 tablet (10 mg total) by mouth every evening., Disp: 90 tablet, Rfl: 3   Multiple Vitamin (MULTIVITAMIN WITH MINERALS) TABS tablet, Take 1 tablet by mouth daily., Disp: , Rfl:    omeprazole (PRILOSEC) 40 MG capsule, Take 1 capsule (40 mg total) by mouth daily. May take up to 2 times per day when needed. Take 30 minutes before meals., Disp: 145 capsule, Rfl: 1   pilocarpine (SALAGEN) 7.5 MG tablet, Take 1 tablet (7.5 mg total) by mouth 3 (three) times daily., Disp: 90 tablet, Rfl: 3   Prasterone (INTRAROSA) 6.5 MG INST, Place 6.5 mg (1 insert) vaginally at bedtime., Disp: 28 each, Rfl: 6   rosuvastatin (CRESTOR) 20 MG tablet, Take 1 tablet (20 mg total) by mouth daily., Disp: 90 tablet, Rfl: 3   sertraline (ZOLOFT) 100 MG tablet, , Disp: , Rfl:    sucralfate (CARAFATE) 1 g tablet, Take 1 tablet (1 g total) by mouth 3 (three) times daily, 1 hour before meals on an empty stomach, Disp: 90 tablet, Rfl: 2   umeclidinium-vilanterol (ANORO ELLIPTA) 62.5-25 MCG/ACT AEPB, Inhale 1 puff into  the lungs daily., Disp: , Rfl:    valsartan-hydrochlorothiazide (DIOVAN-HCT) 320-12.5 MG tablet, Take 1 tablet by mouth every morning., Disp: 90 tablet, Rfl: 0   Alum Hydroxide-Mag Carbonate (GAVISCON PO), Take 1-3 tablets by mouth as needed (heartburn)., Disp: , Rfl:    denosumab (PROLIA) 60 MG/ML SOLN injection, Inject 60 mg into the skin once. Administer in upper arm, thigh, or abdomen (Patient taking differently: Inject 60 mg into the skin every 6 (six) months. Administer in upper arm, thigh, or abdomen), Disp: 1 Syringe, Rfl: 0   Semaglutide-Weight Management (WEGOVY) 0.25 MG/0.5ML SOAJ, Inject 0.25 mg into the skin once a week. (Patient not taking: Reported on 07/24/2022), Disp: 2 mL, Rfl: 2   Assessment     ICD-10-CM   1. Coronary artery disease involving native coronary artery of native heart with other  form of angina pectoris (HCC)  I25.118 EKG 12-Lead    2. Abnormal nuclear stress test  R94.39     3. Hypercholesteremia  E78.00     4. Primary hypertension  I10 valsartan-hydrochlorothiazide (DIOVAN-HCT) 320-12.5 MG tablet    Basic metabolic panel       Meds ordered this encounter  Medications   valsartan-hydrochlorothiazide (DIOVAN-HCT) 320-12.5 MG tablet    Sig: Take 1 tablet by mouth every morning.    Dispense:  90 tablet    Refill:  0    Forward refills to Dr. Duanne Guess   hydrochlorothiazide (MICROZIDE) 12.5 MG capsule    Sig: Take 1 capsule (12.5 mg total) by mouth every morning.    Dispense:  90 capsule    Refill:  0    She will finish Plain valsartan with HCTZ   Orders Placed This Encounter  Procedures   Basic metabolic panel   EKG 12-Lead    Recommendations:   Sharon Daniels is a 72 y.o. 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  1. Coronary artery disease involving native coronary artery of native heart with other form of angina pectoris [facility]) Patient has established coronary artery disease.  She also has had a nuclear stress test in 2022 revealing mild inferior ischemia but on medical therapy she has not had any angina pectoris.  Dyspnea has remained stable.  She is also on best medical therapy with beta-blockers, ARB and also statins with LDL being under excellent control.  She remains asymptomatic, continue present medical therapy unless she develops any chest pain or worsening dyspnea or clinical evidence of heart failure.  Patient does have chronic dyspnea but states that since weight loss she feels well.  2 weeks ago she did water aerobics for greater than 30 minutes without any dyspnea or chest pain.  Encouraged her to do exercises on a regular basis. - EKG 12-Lead  2. Abnormal nuclear stress test As dictated above patient has had abnormal nuclear stress test on 08/01/2020 revealing mild inferior ischemia.  Mild decrease in LVEF.  However she remains asymptomatic and is controlled medical therapy is recommended.  She has normal LVEF by echocardiogram 08/01/2020.  No clinical evidence of heart failure.  3. Hypercholesteremia Patient was started on high intensity statins, on Crestor LDL is well-controlled.  At goal <70.  4. Primary hypertension Blood pressure is markedly elevated today, presently on valsartan 160 mg 1.5 tablets daily, will change it to valsartan HCT 320/12.5 mg in the morning, will obtain BMP in 2 weeks for stability of renal function and potassium levels.  She has follow-up appointment with Dr. Duanne Guess and if blood pressure is still elevated with systolic blood pressure >140 mmHg, add amlodipine or spironolactone depending upon her renal function.  As risk factors are well-controlled except blood pressure is  today uncontrolled, lipids under control, she is asymptomatic and dyspnea has improved since weight loss, would not recommend any further evaluation for coronary artery disease as we already know she has established CAD by elevated coronary calcium score in the 96 percentile and presently on appropriate medical therapy including aspirin.  If she indeed were to develop chest discomfort, chest tightness, worsening dyspnea, then we can consider further evaluation of  her cardiac status.  I will see her back on a as needed basis.  Advised the patient to check the blood pressure at least once every 4 to 5 days.  - valsartan-hydrochlorothiazide (DIOVAN-HCT) 320-12.5 MG tablet; Take 1 tablet by mouth every morning.  Dispense: 90 tablet; Refill: 0 - Basic metabolic panel  Other orders - lamoTRIgine (LAMICTAL) 100 MG tablet; Take 1 tablet (100 mg total) by mouth daily. - hydrochlorothiazide (MICROZIDE) 12.5 MG capsule; Take 1 capsule (12.5 mg total) by mouth every morning.  Dispense: 90 capsule; Refill: 0      Yates Decamp, MD, [facility] 07/24/2022, 12:17 PM Office: [phone] Fax: [phone] Pager: [phone]

## 2022-07-24 NOTE — Patient Instructions (Signed)
As you have plenty of valsartan tablets at home, take 160 mg tablets 2 tablets daily and also add hydrochlorothiazide 12.5 mg in the morning.  Once you are done with either 1 of these, please discard the remaining, please pick up the new prescription for valsartan HCT 320/12.5 mg in the morning.  Will go to [facility] and get blood work done in 2 to 3 weeks after starting the new dose.

## 2022-07-25 NOTE — Progress Notes (Deleted)
Office Visit Note  Patient: Sharon Daniels             Date of Birth: December 21, 1950           MRN: [id number]             PCP: Lewis Moccasin, MD Referring: Lewis Moccasin, MD Visit Date: 08/07/2022 Occupation: @GUAROCC @  Subjective:  Discuss results   History of Present Illness: Sharon Daniels is a 72 y.o. female with history of osteoarthritis.      Activities of Daily Living:  Patient reports morning stiffness for *** {minute/hour:19697}.   Patient {ACTIONS;DENIES/REPORTS:21021675::"Denies"} nocturnal pain.  Difficulty dressing/grooming: {ACTIONS;DENIES/REPORTS:21021675::"Denies"} Difficulty climbing stairs: {ACTIONS;DENIES/REPORTS:21021675::"Denies"} Difficulty getting out of chair: {ACTIONS;DENIES/REPORTS:21021675::"Denies"} Difficulty using hands for taps, buttons, cutlery, and/or writing: {ACTIONS;DENIES/REPORTS:21021675::"Denies"}  No Rheumatology ROS completed.   PMFS History:  Patient Active Problem List   Diagnosis Date Noted  . Hx of colonic polyp 01/15/2022  . Dysphagia 01/15/2022  . Gastroesophageal reflux disease 01/15/2022  . Breast hypertrophy 08/04/2015  . Osteoporosis 05/12/2015  . History of colonic polyps 04/21/2015  . DVT, lower extremity, distal, acute (HCC) 06/17/2013  . Ileus, postoperative (HCC) 06/17/2013  . Hypokalemia 06/17/2013  . Acute pulmonary embolism (HCC) 06/13/2013  . Postoperative anemia due to acute blood loss 06/09/2013  . Transfusion history 06/09/2013  . HIATAL HERNIA 06/18/2007  . FATTY LIVER DISEASE 06/18/2007    Past Medical History:  Diagnosis Date  . Acute pulmonary embolism (HCC) 06/13/2013   CT angio chest 06/12/13  . ADHD   . Alcohol abuse   . Allergy   . Anemia   . Anxiety   . Arthritis   . Back pain   . Broken wrist    right above wrist  . Clotting disorder (HCC)   . Complication of anesthesia    "during breast surgery: could hear the surgery, wake up too soon, anxious  . Constipation   .  COPD (chronic obstructive pulmonary disease) (HCC)   . Depression    hx of  . DVT (deep venous thrombosis) (HCC) 2015  . Fatty liver   . Frequent sinus infections   . GERD (gastroesophageal reflux disease)   . H/O bronchitis   . Hard of hearing   . High cholesterol   . History of kidney stones    x3  . Hypertension   . Joint pain   . Osteoporosis   . Pneumonia    hx of 10 years ago  . PONV (postoperative nausea and vomiting)    "mild nausea"  . Post-menopausal   . Prediabetes   . Stress incontinence   . Substance abuse (HCC)   . Vitamin D deficiency   . Wears glasses     Family History  Problem Relation Age of Onset  . Cancer Mother   . Hyperlipidemia Mother   . Hypertension Mother   . Kidney disease Mother   . Atrial fibrillation Mother   . Breast cancer Mother   . Ovarian cancer Mother   . Heart disease Mother   . Depression Mother   . Alcohol abuse Mother   . Osteoporosis Mother   . Arthritis Mother   . Heart disease Father   . Hyperlipidemia Father   . Hypertension Father   . Heart attack Father   . Epilepsy Daughter   . Healthy Daughter   . Healthy Son   . Colon cancer Neg Hx   . Esophageal cancer Neg Hx   . Stomach cancer Neg Hx   .  Rectal cancer Neg Hx    Past Surgical History:  Procedure Laterality Date  . ABDOMINOPLASTY  09/2015  . BREAST REDUCTION SURGERY  08/04/2015  . BREAST REDUCTION SURGERY Bilateral 08/04/2015   Procedure: MAMMARY REDUCTION  (BREAST);  Surgeon: Etter Sjogren, MD;  Location: [facility] OR;  Service: Plastics;  Laterality: Bilateral;  . BREAST SURGERY  1985   abscess  . CARPAL TUNNEL RELEASE Left   . CESAREAN SECTION  1989, 1992  . COLONOSCOPY    . COSMETIC SURGERY  2013   in the office  . COSMETIC SURGERY     ear lobes shorten  . CYSTECTOMY    . HAMMERTOE RECONSTRUCTION WITH WEIL OSTEOTOMY Right 06/26/2018   Procedure: Right 2nd toe hammertoe correction; 2nd metatarsal Weil osteotomy;  Surgeon: Toni Arthurs, MD;  Location:  [facility];  Service: Orthopedics;  Laterality: Right;   . NASAL SINUS SURGERY  2000  . ORIF ANKLE FRACTURE Left 10/28/2018   Procedure: OPEN REDUCTION INTERNAL FIXATION (ORIF) LEFT ANKLE BIMALLEOLAR FRACTURE DISLOCATION;  Surgeon: Toni Arthurs, MD;  Location: [facility] OR;  Service: Orthopedics;  Laterality: Left;   . TOTAL KNEE ARTHROPLASTY Bilateral 06/05/2013   Procedure: BILATERAL TOTAL KNEE ARTHROPLASTY;  Surgeon: Loanne Drilling, MD;  Location: [facility] ORS;  Service: Orthopedics;  Laterality: Bilateral;   Social History   Social History Narrative  . Not on file   Immunization History  Administered Date(s) Administered  . Moderna Sars-Covid-2 Vaccination 05/25/2020  . PFIZER(Purple Top)SARS-COV-2 Vaccination 03/17/2019, 04/07/2019, 09/23/2019     Objective: Vital Signs: LMP  (LMP Unknown) Comment: not sexually active   Physical Exam Vitals and nursing note reviewed.  Constitutional:      Appearance: She is well-developed.  HENT:     Head: Normocephalic and atraumatic.  Eyes:     Conjunctiva/sclera: Conjunctivae normal.  Cardiovascular:     Rate and Rhythm: Normal rate and regular rhythm.     Heart sounds: Normal heart sounds.  Pulmonary:     Effort: Pulmonary effort is normal.     Breath sounds: Normal breath sounds.  Abdominal:     General: Bowel sounds are normal.     Palpations: Abdomen is soft.  Musculoskeletal:     Cervical back: Normal range of motion.  Lymphadenopathy:     Cervical: No cervical adenopathy.  Skin:    General: Skin is warm and dry.     Capillary Refill: Capillary refill takes less than 2 seconds.  Neurological:     Mental Status: She is alert and oriented to person, place, and time.  Psychiatric:        Behavior: Behavior normal.     Musculoskeletal Exam: ***  CDAI Exam: CDAI Score: -- Patient Global: --; Provider Global: -- Swollen: --; Tender: -- Joint Exam 08/07/2022   No joint exam has been documented for this  visit   There is currently no information documented on the homunculus. Go to the Rheumatology activity and complete the homunculus joint exam.  Investigation: No additional findings.  Imaging: XR Foot 2 Views Left  Result Date: 07/17/2022 First MTP, PIP and DIP narrowing was noted.  No intertarsal narrowing was noted.  Hardware was present in the tibia.  Tibial talar joint was noted well-visualized.  No subtalar narrowing was noted.  Inferior calcaneal spur was noted. Impression: These findings are suggestive of osteoarthritis of the foot.  XR Foot 2 Views Right  Result Date: 07/17/2022 First and second MTP narrowing was noted.  PIP and DIP  narrowing was noted.  Screw was noted in the second metatarsal head.  Surgical fusion of second PIP joint was noted.  No intertarsal, tibiotalar or subtalar joint space narrowing was noted.  Inferior and posterior calcaneal spurs were noted.  No erosive changes were noted. Impression: These findings are suggestive of osteoarthritis of the foot.  XR Hand 2 View Left  Result Date: 07/17/2022 CMC, PIP and DIP narrowing was noted.  No MCP, intercarpal or radiocarpal joint space narrowing was noted.  No erosive changes were noted. Impression: These findings are suggestive of osteoarthritis of the hand.  XR Hand 2 View Right  Result Date: 07/17/2022 Severe CMC narrowing was noted.  First MCP narrowing was noted.  Severe PIP and DIP narrowing was noted.  No other MCP involvement was noted.  No intercarpal or radiocarpal joint space narrowing was noted.  No erosive changes were noted. Impression: These findings are suggestive of osteoarthritis of the hand.  CT CHEST LUNG CA SCREEN LOW DOSE W/O CM  Result Date: 07/13/2022 CLINICAL DATA:  72 year old female current smoker with 32 pack-year history of smoking. Lung cancer screening examination. EXAM: CT CHEST WITHOUT CONTRAST LOW-DOSE FOR LUNG CANCER SCREENING TECHNIQUE: Multidetector CT imaging of the chest was  performed following the standard protocol without IV contrast. RADIATION DOSE REDUCTION: This exam was performed according to the departmental dose-optimization program which includes automated exposure control, adjustment of the mA and/or kV according to patient size and/or use of iterative reconstruction technique. COMPARISON:  Low-dose lung cancer screening chest CT 08/08/2020. FINDINGS: Cardiovascular: Heart size is normal. There is no significant pericardial fluid, thickening or pericardial calcification. There is aortic atherosclerosis, as well as atherosclerosis of the great vessels of the mediastinum and the coronary arteries, including calcified atherosclerotic plaque in the left main, left anterior descending, left circumflex and right coronary arteries. Mediastinum/Nodes: No pathologically enlarged mediastinal or hilar lymph nodes. Please note that accurate exclusion of hilar adenopathy is limited on noncontrast CT scans. Esophagus is unremarkable in appearance. No axillary lymphadenopathy. Lungs/Pleura: Multiple small pulmonary nodules are noted in the lungs, largest of which is in the base of the left lower lobe (axial image 221), with a volume derived mean diameter 4.6 mm. No larger more suspicious appearing pulmonary nodules or masses are noted. No acute consolidative airspace disease. No pleural effusions. Mild diffuse bronchial wall thickening with mild centrilobular and paraseptal emphysema. Upper Abdomen: Aortic atherosclerosis. Musculoskeletal: There are no aggressive appearing lytic or blastic lesions noted in the visualized portions of the skeleton. IMPRESSION: 1. Lung-RADS 2S, benign appearance or behavior. Continue annual screening with low-dose chest CT without contrast in 12 months. 2. The "S" modifier above refers to potentially clinically significant non lung cancer related findings. Specifically, there is aortic atherosclerosis, in addition to left main and three-vessel coronary artery  disease. Please note that although the presence of coronary artery calcium documents the presence of coronary artery disease, the severity of this disease and any potential stenosis cannot be assessed on this non-gated CT examination. Assessment for potential risk factor modification, dietary therapy or pharmacologic therapy may be warranted, if clinically indicated. 3. Mild diffuse bronchial wall thickening with mild centrilobular and paraseptal emphysema; imaging findings suggestive of underlying COPD. Aortic Atherosclerosis (ICD10-I70.0) and Emphysema (ICD10-J43.9). Electronically Signed   By: Trudie Reed M.D.   On: 07/13/2022 07:37    Recent Labs: Lab Results  Component Value Date   WBC 6.5 08/09/2020   HGB 12.3 08/09/2020   PLT 242 08/09/2020   NA  141 08/09/2020   K 4.9 08/09/2020   CL 102 08/09/2020   CO2 25 08/09/2020   GLUCOSE 109 (H) 08/09/2020   BUN 15 08/09/2020   CREATININE 0.83 08/09/2020   BILITOT 0.3 08/09/2020   ALKPHOS 69 08/09/2020   AST 26 08/09/2020   ALT 24 08/09/2020   PROT 7.0 07/17/2022   ALBUMIN 4.8 08/09/2020   CALCIUM 9.8 03/22/2022   GFRAA >60 10/20/2018    Speciality Comments: No specialty comments available.  Procedures:  No procedures performed Allergies: Morphine   Assessment / Plan:     Visit Diagnoses: Primary osteoarthritis of both hands  Primary osteoarthritis of both feet  Positive ANA (antinuclear antibody) - 07/17/22: ANA 1:40cytoplasmic, Ro-, La-, RF-, SPEP normal, complements WNL, ESR WNL, Scl-70-, RNP-, smith-, anti-CCP-  Dry mouth  Status post bilateral knee replacements  History of DVT (deep vein thrombosis)  History of pulmonary embolism  History of gastroesophageal reflux (GERD)  Fatty liver  Ileus, postoperative (HCC)  Age-related osteoporosis without current pathological fracture  Vitamin D deficiency  Hx of colonic polyp  Essential hypertension  History of nephrolithiasis  Orders: No orders of the  defined types were placed in this encounter.  No orders of the defined types were placed in this encounter.   Face-to-face time spent with patient was *** minutes. Greater than 50% of time was spent in counseling and coordination of care.  Follow-Up Instructions: No follow-ups on file.   Gearldine Bienenstock, PA-C  Note - This record has been created using Dragon software.  Chart creation errors have been sought, but may not always  have been located. Such creation errors do not reflect on  the standard of medical care.

## 2022-08-02 ENCOUNTER — Encounter: Payer: Medicare Other | Admitting: Physician Assistant

## 2022-08-02 ENCOUNTER — Other Ambulatory Visit (HOSPITAL_COMMUNITY): Payer: Self-pay

## 2022-08-02 MED ORDER — LAMOTRIGINE 100 MG PO TABS
100.0000 mg | ORAL_TABLET | Freq: Every day | ORAL | 0 refills | Status: AC
  Filled 2022-08-02: qty 90, 90d supply, fill #0

## 2022-08-02 MED ORDER — LAMOTRIGINE 150 MG PO TABS
150.0000 mg | ORAL_TABLET | Freq: Every day | ORAL | 0 refills | Status: AC
  Filled 2022-08-02: qty 90, 90d supply, fill #0

## 2022-08-02 MED ORDER — BUPROPION HCL ER (XL) 300 MG PO TB24
300.0000 mg | ORAL_TABLET | Freq: Every day | ORAL | 0 refills | Status: DC
  Filled 2022-08-02 – 2022-09-06 (×2): qty 90, 90d supply, fill #0

## 2022-08-02 MED ORDER — SERTRALINE HCL 100 MG PO TABS
100.0000 mg | ORAL_TABLET | Freq: Every day | ORAL | 0 refills | Status: AC
Start: 2022-08-02 — End: ?
  Filled 2022-08-02: qty 90, 90d supply, fill #0

## 2022-08-02 MED ORDER — ATOMOXETINE HCL 80 MG PO CAPS
80.0000 mg | ORAL_CAPSULE | Freq: Every day | ORAL | 0 refills | Status: DC
  Filled 2022-08-02 – 2022-09-06 (×2): qty 90, 90d supply, fill #0

## 2022-08-06 ENCOUNTER — Other Ambulatory Visit (HOSPITAL_COMMUNITY): Payer: Self-pay

## 2022-08-07 ENCOUNTER — Other Ambulatory Visit (HOSPITAL_COMMUNITY): Payer: Self-pay

## 2022-08-07 ENCOUNTER — Ambulatory Visit: Payer: Medicare Other | Admitting: Physician Assistant

## 2022-08-07 DIAGNOSIS — Z86711 Personal history of pulmonary embolism: Secondary | ICD-10-CM

## 2022-08-07 DIAGNOSIS — Z96653 Presence of artificial knee joint, bilateral: Secondary | ICD-10-CM

## 2022-08-07 DIAGNOSIS — R682 Dry mouth, unspecified: Secondary | ICD-10-CM

## 2022-08-07 DIAGNOSIS — Z8719 Personal history of other diseases of the digestive system: Secondary | ICD-10-CM

## 2022-08-07 DIAGNOSIS — K567 Ileus, unspecified: Secondary | ICD-10-CM

## 2022-08-07 DIAGNOSIS — M810 Age-related osteoporosis without current pathological fracture: Secondary | ICD-10-CM

## 2022-08-07 DIAGNOSIS — M19071 Primary osteoarthritis, right ankle and foot: Secondary | ICD-10-CM

## 2022-08-07 DIAGNOSIS — Z87442 Personal history of urinary calculi: Secondary | ICD-10-CM

## 2022-08-07 DIAGNOSIS — Z86718 Personal history of other venous thrombosis and embolism: Secondary | ICD-10-CM

## 2022-08-07 DIAGNOSIS — K760 Fatty (change of) liver, not elsewhere classified: Secondary | ICD-10-CM

## 2022-08-07 DIAGNOSIS — I10 Essential (primary) hypertension: Secondary | ICD-10-CM

## 2022-08-07 DIAGNOSIS — M19041 Primary osteoarthritis, right hand: Secondary | ICD-10-CM

## 2022-08-07 DIAGNOSIS — E559 Vitamin D deficiency, unspecified: Secondary | ICD-10-CM

## 2022-08-07 DIAGNOSIS — Z86010 Personal history of colonic polyps: Secondary | ICD-10-CM

## 2022-08-07 DIAGNOSIS — R768 Other specified abnormal immunological findings in serum: Secondary | ICD-10-CM

## 2022-08-10 NOTE — Progress Notes (Unsigned)
GYNECOLOGY  VISIT   HPI: 72 y.o.   Divorced  Caucasian  female   [phone] with No LMP recorded (lmp unknown). Patient is postmenopausal.   here for discuss bone density.  She wants a fast consultation today.  BMD done 07/10/22:  T score left femoral neck -2.4, stable since 2022 bone density.  Has been on Prolia for about 5 - 6 years.  Last injection done 06/19/22.  Missed a dose in December, 2023.  Is now done with her dental work that she has been doing for 18 months. Finished 2 months ago.  Had a fracture in her hand last fall.   Hx low vit D.  Hx DVT.   GYNECOLOGIC HISTORY: No LMP recorded (lmp unknown). Patient is postmenopausal. Contraception:  PMP Menopausal hormone therapy:  n/a Last mammogram:  05/19/19 Breast Density Cat C Last pap smear:   04/30/18 neg        OB History     Gravida  3   Para  2   Term  2   Preterm      AB  1   Living  2      SAB  1   IAB      Ectopic      Multiple      Live Births                 Patient Active Problem List   Diagnosis Date Noted   Hx of colonic polyp 01/15/2022   Dysphagia 01/15/2022   Gastroesophageal reflux disease 01/15/2022   Breast hypertrophy 08/04/2015   Osteoporosis 05/12/2015   History of colonic polyps 04/21/2015   DVT, lower extremity, distal, acute (HCC) 06/17/2013   Ileus, postoperative (HCC) 06/17/2013   Hypokalemia 06/17/2013   Acute pulmonary embolism (HCC) 06/13/2013   Postoperative anemia due to acute blood loss 06/09/2013   Transfusion history 06/09/2013   HIATAL HERNIA 06/18/2007   FATTY LIVER DISEASE 06/18/2007    Past Medical History:  Diagnosis Date   Acute pulmonary embolism (HCC) 06/13/2013   CT angio chest 06/12/13   ADHD    Alcohol abuse    Allergy    Anemia    Anxiety    Arthritis    Back pain    Broken wrist    right above wrist   Clotting disorder (HCC)    Complication of anesthesia    "during breast surgery: could hear the surgery, wake up too soon, anxious    Constipation    COPD (chronic obstructive pulmonary disease) (HCC)    Depression    hx of   DVT (deep venous thrombosis) (HCC) 2015   Fatty liver    Frequent sinus infections    GERD (gastroesophageal reflux disease)    H/O bronchitis    Hard of hearing    High cholesterol    History of kidney stones    x3   Hypertension    Joint pain    Osteoporosis    Pneumonia    hx of 10 years ago   PONV (postoperative nausea and vomiting)    "mild nausea"   Post-menopausal    Prediabetes    Stress incontinence    Substance abuse (HCC)    Vitamin D deficiency    Wears glasses     Past Surgical History:  Procedure Laterality Date   ABDOMINOPLASTY  09/2015   BREAST REDUCTION SURGERY  08/04/2015   BREAST REDUCTION SURGERY Bilateral 08/04/2015   Procedure: MAMMARY REDUCTION  (BREAST);  Surgeon: Etter Sjogren, MD;  Location: [facility];  Service: Plastics;  Laterality: Bilateral;   BREAST SURGERY  1985   abscess   CARPAL TUNNEL RELEASE Left    CESAREAN SECTION  1989, 1992   COLONOSCOPY     COSMETIC SURGERY  2013   in the office   COSMETIC SURGERY     ear lobes shorten   CYSTECTOMY     HAMMERTOE RECONSTRUCTION WITH WEIL OSTEOTOMY Right 06/26/2018   Procedure: Right 2nd toe hammertoe correction; 2nd metatarsal Weil osteotomy;  Surgeon: Toni Arthurs, MD;  Location: [facility];  Service: Orthopedics;  Laterality: Right;    NASAL SINUS SURGERY  2000   ORIF ANKLE FRACTURE Left 10/28/2018   Procedure: OPEN REDUCTION INTERNAL FIXATION (ORIF) LEFT ANKLE BIMALLEOLAR FRACTURE DISLOCATION;  Surgeon: Toni Arthurs, MD;  Location: [facility] OR;  Service: Orthopedics;  Laterality: Left;    TOTAL KNEE ARTHROPLASTY Bilateral 06/05/2013   Procedure: BILATERAL TOTAL KNEE ARTHROPLASTY;  Surgeon: Loanne Drilling, MD;  Location: [facility];  Service: Orthopedics;  Laterality: Bilateral;    Current Outpatient Medications  Medication Sig Dispense Refill   albuterol (VENTOLIN HFA) 108 (90  Base) MCG/ACT inhaler Inhale 2 puffs into the lungs every 4 to 6 hours as needed 6.7 g 2   Alum Hydroxide-Mag Carbonate (GAVISCON PO) Take 1-3 tablets by mouth as needed (heartburn).     Ascorbic Acid (VITAMIN C) 500 MG CAPS Take 1 capsule by mouth daily.     aspirin 81 MG EC tablet Take 81 mg by mouth daily. Swallow whole.     atomoxetine (STRATTERA) 80 MG capsule Take 1 capsule (80 mg total) by mouth in the morning. 90 capsule 1   atomoxetine (STRATTERA) 80 MG capsule Take 1 capsule (80 mg total) by mouth daily. 90 capsule 0   azelastine (ASTELIN) 0.1 % nasal spray Place 2 sprays into both nostrils 2 (two) times daily. 30 mL 0   BLACK COHOSH PO Take 1 capsule by mouth daily.     buPROPion (WELLBUTRIN XL) 300 MG 24 hr tablet Take 1 tablet (300 mg total) by mouth in the morning. 90 tablet 3   buPROPion (WELLBUTRIN XL) 300 MG 24 hr tablet Take 1 tablet (300 mg total) by mouth daily. 90 tablet 0   Cholecalciferol (VITAMIN D3) 1000 UNITS CAPS Take 5,000 Units by mouth daily.     diclofenac Sodium (VOLTAREN) 1 % GEL Apply 4 g topically to affected area 4 (four) times daily. 500 g 11   hydrochlorothiazide (MICROZIDE) 12.5 MG capsule Take 1 capsule (12.5 mg total) by mouth every morning. 90 capsule 0   Insulin Pen Needle (TECHLITE PEN NEEDLES) 31G X 6 MM MISC Use a pen needle to inject Victoza under the skin once daily 200 each 0   lamoTRIgine (LAMICTAL) 100 MG tablet Take 1 tablet (100 mg total) by mouth daily.     lamoTRIgine (LAMICTAL) 100 MG tablet Take 1 tablet (100 mg total) by mouth daily. 90 tablet 0   lamoTRIgine (LAMICTAL) 150 MG tablet Take 1 tablet (150 mg total) by mouth 2 (two) times daily. (Patient taking differently: Take 150 mg by mouth 2 (two) times daily. (Total 250 mg twice daily)) 30 tablet 1   lamoTRIgine (LAMICTAL) 150 MG tablet Take 1 tablet (150 mg total) by mouth daily. 90 tablet 0   metoprolol succinate (TOPROL-XL) 50 MG 24 hr tablet Take 1 tablet (50 mg total) by mouth 2 (two)  times daily. 180 tablet 3  montelukast (SINGULAIR) 10 MG tablet Take 1 tablet (10 mg total) by mouth every evening. 90 tablet 3   Multiple Vitamin (MULTIVITAMIN WITH MINERALS) TABS tablet Take 1 tablet by mouth daily.     omeprazole (PRILOSEC) 40 MG capsule Take 1 capsule (40 mg total) by mouth daily. May take up to 2 times per day when needed. Take 30 minutes before meals. 145 capsule 1   pilocarpine (SALAGEN) 7.5 MG tablet Take 1 tablet (7.5 mg total) by mouth 3 (three) times daily. 90 tablet 3   Prasterone (INTRAROSA) 6.5 MG INST Place 6.5 mg (1 insert) vaginally at bedtime. 28 each 6   rosuvastatin (CRESTOR) 20 MG tablet Take 1 tablet (20 mg total) by mouth daily. 90 tablet 3   Semaglutide-Weight Management (WEGOVY) 0.25 MG/0.5ML SOAJ Inject 0.25 mg into the skin once a week. 2 mL 2   sertraline (ZOLOFT) 100 MG tablet      sertraline (ZOLOFT) 100 MG tablet Take 1 tablet (100 mg total) by mouth daily. 90 tablet 0   sucralfate (CARAFATE) 1 g tablet Take 1 tablet (1 g total) by mouth 3 (three) times daily, 1 hour before meals on an empty stomach 90 tablet 2   umeclidinium-vilanterol (ANORO ELLIPTA) 62.5-25 MCG/ACT AEPB Inhale 1 puff into the lungs daily.     valsartan-hydrochlorothiazide (DIOVAN-HCT) 320-12.5 MG tablet Take 1 tablet by mouth every morning. 90 tablet 0   denosumab (PROLIA) 60 MG/ML SOLN injection Inject 60 mg into the skin once. Administer in upper arm, thigh, or abdomen (Patient taking differently: Inject 60 mg into the skin every 6 (six) months. Administer in upper arm, thigh, or abdomen) 1 Syringe 0   No current facility-administered medications for this visit.     ALLERGIES: Morphine  Family History  Problem Relation Age of Onset   Cancer Mother    Hyperlipidemia Mother    Hypertension Mother    Kidney disease Mother    Atrial fibrillation Mother    Breast cancer Mother    Ovarian cancer Mother    Heart disease Mother    Depression Mother    Alcohol abuse Mother     Osteoporosis Mother    Arthritis Mother    Heart disease Father    Hyperlipidemia Father    Hypertension Father    Heart attack Father    Epilepsy Daughter    Healthy Daughter    Healthy Son    Colon cancer Neg Hx    Esophageal cancer Neg Hx    Stomach cancer Neg Hx    Rectal cancer Neg Hx     Social History   Socioeconomic History   Marital status: Divorced    Spouse name: Not on file   Number of children: 2   Years of education: Not on file   Highest education level: Not on file  Occupational History   Occupation: Child psychotherapist   Occupation: Retired Veterinary surgeon  Tobacco Use   Smoking status: Former    Current packs/day: 0.00    Average packs/day: 1 pack/day for 30.0 years (30.0 ttl pk-yrs)    Types: Cigarettes    Start date: 10/20/1987    Quit date: 10/19/2017    Years since quitting: 4.8    Passive exposure: Past   Smokeless tobacco: Never  Vaping Use   Vaping status: Former   Substances: Flavoring  Substance and Sexual Activity   Alcohol use: Not Currently   Drug use: No    Comment: hx of cocaine, none 1982  Sexual activity: Not Currently    Birth control/protection: Post-menopausal, Abstinence    Comment: 1st intercourse 72 yo-more than 5 partners  Other Topics Concern   Not on file  Social History Narrative   Not on file   Social Determinants of Health   Financial Resource Strain: Not on file  Food Insecurity: Not on file  Transportation Needs: Not on file  Physical Activity: Not on file  Stress: Not on file  Social Connections: Not on file  Intimate Partner Violence: Not on file    Review of Systems  All other systems reviewed and are negative.   PHYSICAL EXAMINATION:    BP 126/82 (BP Location: Left Arm, Patient Position: Sitting, Cuff Size: Normal)   Ht 5' 6.25" (1.683 m)   Wt 169 lb (76.7 kg)   LMP  (LMP Unknown) Comment: not sexually active  BMI 27.07 kg/m     General appearance: alert, cooperative and appears stated  age  ASSESSMENT  Osteoporosis, stable on Prolia.  Last injection June 19, 2022. Hx recent prolonged dental work.   Hx DVT/PE. Hx hiatal hernia.  PLAN  Bone density reviewed.  I do not see contraindications to continue Prolia, next dose in December, as long as her dentist thinks she is a good candidate to continue.  She will contact dentist - Dr. Retta Mac is her dentist.   We discussed increased risk of vertebral fracture with stopping Prolia if another osteoporosis medication is not introduced. Continue calcium, vit D, weight bearing exercise.  She will return for a vitamin D check.  She is unable to do this lab work today. We reviewed reduction of risks of falls.

## 2022-08-14 ENCOUNTER — Encounter: Payer: Self-pay | Admitting: Obstetrics and Gynecology

## 2022-08-14 ENCOUNTER — Ambulatory Visit (INDEPENDENT_AMBULATORY_CARE_PROVIDER_SITE_OTHER): Payer: Medicare Other | Admitting: Obstetrics and Gynecology

## 2022-08-14 VITALS — BP 126/82 | Ht 66.25 in | Wt 169.0 lb

## 2022-08-14 DIAGNOSIS — M8580 Other specified disorders of bone density and structure, unspecified site: Secondary | ICD-10-CM

## 2022-08-16 NOTE — Progress Notes (Signed)
Office Visit Note  Patient: Sharon Daniels             Date of Birth: 1950/10/14           MRN: [id number]             PCP: Lewis Moccasin, MD Referring: Lewis Moccasin, MD Visit Date: 08/20/2022 Occupation: @GUAROCC @  Subjective:  Discuss results   History of Present Illness: Sharon Daniels is a 72 y.o. female with history of osteoarthritis.  Patient presents today to discuss lab results and x-ray results from her initial office visit.  Patient states she continues to have sicca symptoms but has been taking pilocarpine 5 mg 1 tablet daily for symptomatic relief. She continues to experience chronic pain in both hands especially in the [facility] joints.  She denies any obvious joint swelling at this time.  She denies any other new or worsening symptoms since her initial office visit.    Activities of Daily Living:  Patient reports morning stiffness for 30 minutes.   Patient Denies nocturnal pain.  Difficulty dressing/grooming: Denies Difficulty climbing stairs: Denies Difficulty getting out of chair: Denies Difficulty using hands for taps, buttons, cutlery, and/or writing: Reports  Review of Systems  Constitutional:  Positive for fatigue.  HENT:  Positive for mouth dryness. Negative for mouth sores and nose dryness.   Eyes:  Positive for pain and dryness. Negative for visual disturbance.  Respiratory:  Positive for shortness of breath. Negative for cough, hemoptysis and difficulty breathing.        With exertion   Cardiovascular:  Negative for chest pain, palpitations, hypertension and swelling in legs/feet.  Gastrointestinal:  Positive for constipation. Negative for blood in stool and diarrhea.  Endocrine: Negative for increased urination.  Genitourinary:  Negative for painful urination.  Musculoskeletal:  Positive for joint pain, joint pain, myalgias, morning stiffness, muscle tenderness and myalgias. Negative for joint swelling and muscle weakness.  Skin:   Negative for color change, pallor, rash, hair loss, nodules/bumps, skin tightness, ulcers and sensitivity to sunlight.  Allergic/Immunologic: Negative for susceptible to infections.  Neurological:  Positive for numbness and headaches. Negative for dizziness and weakness.  Hematological:  Negative for swollen glands.  Psychiatric/Behavioral:  Negative for depressed mood and sleep disturbance. The patient is not nervous/anxious.     PMFS History:  Patient Active Problem List   Diagnosis Date Noted   Hx of colonic polyp 01/15/2022   Dysphagia 01/15/2022   Gastroesophageal reflux disease 01/15/2022   Breast hypertrophy 08/04/2015   Osteoporosis 05/12/2015   History of colonic polyps 04/21/2015   DVT, lower extremity, distal, acute (HCC) 06/17/2013   Ileus, postoperative (HCC) 06/17/2013   Hypokalemia 06/17/2013   Acute pulmonary embolism (HCC) 06/13/2013   Postoperative anemia due to acute blood loss 06/09/2013   Transfusion history 06/09/2013   HIATAL HERNIA 06/18/2007   FATTY LIVER DISEASE 06/18/2007    Past Medical History:  Diagnosis Date   Acute pulmonary embolism (HCC) 06/13/2013   CT angio chest 06/12/13   ADHD    Alcohol abuse    Allergy    Anemia    Anxiety    Arthritis    Back pain    Broken wrist    right above wrist   Clotting disorder (HCC)    Complication of anesthesia    "during breast surgery: could hear the surgery, wake up too soon, anxious   Constipation    COPD (chronic obstructive pulmonary disease) (HCC)    Depression  hx of   DVT (deep venous thrombosis) (HCC) 2015   Fatty liver    Frequent sinus infections    GERD (gastroesophageal reflux disease)    H/O bronchitis    Hard of hearing    High cholesterol    History of kidney stones    x3   Hypertension    Joint pain    Osteoporosis    Pneumonia    hx of 10 years ago   PONV (postoperative nausea and vomiting)    "mild nausea"   Post-menopausal    Prediabetes    Stress incontinence     Substance abuse (HCC)    Vitamin D deficiency    Wears glasses     Family History  Problem Relation Age of Onset   Cancer Mother    Hyperlipidemia Mother    Hypertension Mother    Kidney disease Mother    Atrial fibrillation Mother    Breast cancer Mother    Ovarian cancer Mother    Heart disease Mother    Depression Mother    Alcohol abuse Mother    Osteoporosis Mother    Arthritis Mother    Heart disease Father    Hyperlipidemia Father    Hypertension Father    Heart attack Father    Epilepsy Daughter    Healthy Daughter    Healthy Son    Colon cancer Neg Hx    Esophageal cancer Neg Hx    Stomach cancer Neg Hx    Rectal cancer Neg Hx    Past Surgical History:  Procedure Laterality Date   ABDOMINOPLASTY  09/2015   BREAST REDUCTION SURGERY  08/04/2015   BREAST REDUCTION SURGERY Bilateral 08/04/2015   Procedure: MAMMARY REDUCTION  (BREAST);  Surgeon: Etter Sjogren, MD;  Location: [facility] OR;  Service: Plastics;  Laterality: Bilateral;   BREAST SURGERY  1985   abscess   CARPAL TUNNEL RELEASE Left    CESAREAN SECTION  1989, 1992   COLONOSCOPY     COSMETIC SURGERY  2013   in the office   COSMETIC SURGERY     ear lobes shorten   CYSTECTOMY     HAMMERTOE RECONSTRUCTION WITH WEIL OSTEOTOMY Right 06/26/2018   Procedure: Right 2nd toe hammertoe correction; 2nd metatarsal Weil osteotomy;  Surgeon: Toni Arthurs, MD;  Location: [facility];  Service: Orthopedics;  Laterality: Right;    NASAL SINUS SURGERY  2000   ORIF ANKLE FRACTURE Left 10/28/2018   Procedure: OPEN REDUCTION INTERNAL FIXATION (ORIF) LEFT ANKLE BIMALLEOLAR FRACTURE DISLOCATION;  Surgeon: Toni Arthurs, MD;  Location: [facility] OR;  Service: Orthopedics;  Laterality: Left;    TOTAL KNEE ARTHROPLASTY Bilateral 06/05/2013   Procedure: BILATERAL TOTAL KNEE ARTHROPLASTY;  Surgeon: Loanne Drilling, MD;  Location: [facility] ORS;  Service: Orthopedics;  Laterality: Bilateral;   Social History   Social History  Narrative   Not on file   Immunization History  Administered Date(s) Administered   Moderna Sars-Covid-2 Vaccination 05/25/2020   PFIZER(Purple Top)SARS-COV-2 Vaccination 03/17/2019, 04/07/2019, 09/23/2019     Objective: Vital Signs: BP 126/81 (BP Location: Left Arm, Patient Position: Sitting, Cuff Size: Normal)   Pulse 67   Resp 14   Ht 5\' 6"  (1.676 m)   Wt 173 lb (78.5 kg)   LMP  (LMP Unknown) Comment: not sexually active  BMI 27.92 kg/m    Physical Exam Vitals and nursing note reviewed.  Constitutional:      Appearance: She is well-developed.  HENT:  Head: Normocephalic and atraumatic.  Eyes:     Conjunctiva/sclera: Conjunctivae normal.  Cardiovascular:     Rate and Rhythm: Normal rate and regular rhythm.     Heart sounds: Normal heart sounds.  Pulmonary:     Effort: Pulmonary effort is normal.     Breath sounds: Normal breath sounds.  Abdominal:     General: Bowel sounds are normal.     Palpations: Abdomen is soft.  Musculoskeletal:     Cervical back: Normal range of motion.  Lymphadenopathy:     Cervical: No cervical adenopathy.  Skin:    General: Skin is warm and dry.     Capillary Refill: Capillary refill takes less than 2 seconds.  Neurological:     Mental Status: She is alert and oriented to person, place, and time.  Psychiatric:        Behavior: Behavior normal.      Musculoskeletal Exam: C-spine, thoracic spine, lumbar spine good range of motion.  Shoulder joints have good range of motion.  Elbow joints have good range of motion.  Both wrist joints have good range of motion with no tenderness or synovitis.  CMC joint prominence and thickening noted bilaterally.  PIP and DIP thickening consistent with osteoarthritis of both hands.  No tenderness or synovitis over MCP joints.  Hip joints have good range of motion with no groin pain.  Knee joints have good range of motion with no warmth or effusion.  Ankle joints have good range of motion with no  tenderness or joint swelling.  CDAI Exam: CDAI Score: -- Patient Global: --; Provider Global: -- Swollen: --; Tender: -- Joint Exam 08/20/2022   No joint exam has been documented for this visit   There is currently no information documented on the homunculus. Go to the Rheumatology activity and complete the homunculus joint exam.  Investigation: No additional findings.  Imaging: No results found.  Recent Labs: Lab Results  Component Value Date   WBC 6.5 08/09/2020   HGB 12.3 08/09/2020   PLT 242 08/09/2020   NA 141 08/09/2020   K 4.9 08/09/2020   CL 102 08/09/2020   CO2 25 08/09/2020   GLUCOSE 109 (H) 08/09/2020   BUN 15 08/09/2020   CREATININE 0.83 08/09/2020   BILITOT 0.3 08/09/2020   ALKPHOS 69 08/09/2020   AST 26 08/09/2020   ALT 24 08/09/2020   PROT 7.0 07/17/2022   ALBUMIN 4.8 08/09/2020   CALCIUM 9.8 03/22/2022   GFRAA >60 10/20/2018    Speciality Comments: No specialty comments available.  Procedures:  No procedures performed Allergies: Morphine    Assessment / Plan:     Visit Diagnoses: Dry mouth: Ro and La antibodies negative on 07/17/2022.  ANA 1: 40 cytoplasmic 07/17/2022.  Discussed the possibility of seronegative Sjogren's.  Patient is apprehensive to proceed with a lip biopsy at this time.  She has been taking pilocarpine 7.5 mg 1 tablet daily for symptomatic relief.  Discussed the use of Biotene or XyliMelts available over-the-counter for symptomatic relief.  Patient has been seeing the dentist quarterly. She will notify us if she develops any new or worsening symptoms.  Dry eyes: Ro and La antibodies negative on 07/17/22.  Discussed the use of Systane or refresh eyedrops for symptomatic relief.  Also discussed the use of an eye mask and humidifier at night.  Recommend following up closely with ophthalmology.  She will notify us if she develops any new or worsening symptoms.  Primary osteoarthritis of both hands - RF-,  AntiCCP-, ESR WNL. History of  carpal tunnel release bilaterally.  Left hand dominant. Fhx of RA- Father: Clinical and radiographic findings are consistent with osteoarthritis.  No erosive changes noted on x-rays.  CMC joint prominence and thickening noted bilaterally.  No tenderness or synovitis over MCP joints.  Complete fist formation bilaterally.  Discussed the importance of joint protection and muscle strengthening.  Patient was given a handout of hand exercises to perform.  Discussed the use of arthritis compression gloves, paraffin wax, and topical agents for pain relief.  Patient plans on using [facility] joint braces as needed if she develops any increased discomfort with repetitive activities or changes in weather.  Patient was given a jar gripper to assist her as well as a list of natural anti-inflammatories including turmeric, tart cherry, ginger, and omega-3.  Patient will notify us if her symptoms persist or worsen at which time we can schedule an ultrasound to assess for synovitis.  Family history of rheumatoid arthritis: Father  Primary osteoarthritis of both feet: X-rays consistent with osteoarthritis.  No erosive changes noted.  No tenderness or synovitis of ankle joints noted.  She is wearing proper fitting shoes.  Status post bilateral knee replacements:  Performed by Dr. Lequita Halt. Doing well.  No warmth or effusion noted.  Positive ANA (antinuclear antibody) - 07/17/22: ANA 1:40 cytoplasmic, dsDNA-, Ro-, La-, Sm-, Scl-70-,RNP-, complements WNl, ESR WNL: Reviewed lab results today in detail.  She has no clinical features of systemic lupus at this time.  Patient will notify us if she develops any new or worsening symptoms.  Recommended repeating lab work in 6 months--offered to schedule a follow-up visit with [place] in 6 months versus having her PCP obtain repeat lab work.  Patient requested to have her PCP recheck the lab work in 6 months.  She will notify us if she develops any new or worsening symptoms.  Age-related  osteoporosis without current pathological fracture: DEXA updated on 07/10/2022: LFN BMD 0.586 with T-score -2.4.  Patient has been on Prolia 60 mg sq injections for the past 3 to 4 years.  History of left ankle fracture and left wrist fracture.  Family history of osteoporosis.  She is taking vitamin D 5000 units daily.  Vitamin D deficiency: She is taking vitamin D 5000 units daily.  Other medical conditions are listed as follows:  History of DVT (deep vein thrombosis)  History of pulmonary embolism  History of gastroesophageal reflux (GERD)  Fatty liver  Ileus, postoperative (HCC)  Hx of colonic polyp  Essential hypertension: Blood pressure was 126/81 today in the office.  History of nephrolithiasis  Orders: No orders of the defined types were placed in this encounter.  No orders of the defined types were placed in this encounter.    Follow-Up Instructions: Return if symptoms worsen or fail to improve.   Gearldine Bienenstock, PA-C  Note - This record has been created using Dragon software.  Chart creation errors have been sought, but may not always  have been located. Such creation errors do not reflect on  the standard of medical care.

## 2022-08-20 ENCOUNTER — Encounter: Payer: Self-pay | Admitting: Physician Assistant

## 2022-08-20 ENCOUNTER — Ambulatory Visit: Payer: Medicare Other | Attending: Physician Assistant | Admitting: Physician Assistant

## 2022-08-20 VITALS — BP 126/81 | HR 67 | Resp 14 | Ht 66.0 in | Wt 173.0 lb

## 2022-08-20 DIAGNOSIS — M19071 Primary osteoarthritis, right ankle and foot: Secondary | ICD-10-CM

## 2022-08-20 DIAGNOSIS — R768 Other specified abnormal immunological findings in serum: Secondary | ICD-10-CM

## 2022-08-20 DIAGNOSIS — Z8261 Family history of arthritis: Secondary | ICD-10-CM

## 2022-08-20 DIAGNOSIS — Z86010 Personal history of colonic polyps: Secondary | ICD-10-CM

## 2022-08-20 DIAGNOSIS — K9189 Other postprocedural complications and disorders of digestive system: Secondary | ICD-10-CM

## 2022-08-20 DIAGNOSIS — R682 Dry mouth, unspecified: Secondary | ICD-10-CM | POA: Diagnosis not present

## 2022-08-20 DIAGNOSIS — E559 Vitamin D deficiency, unspecified: Secondary | ICD-10-CM

## 2022-08-20 DIAGNOSIS — M19072 Primary osteoarthritis, left ankle and foot: Secondary | ICD-10-CM

## 2022-08-20 DIAGNOSIS — Z86718 Personal history of other venous thrombosis and embolism: Secondary | ICD-10-CM

## 2022-08-20 DIAGNOSIS — K760 Fatty (change of) liver, not elsewhere classified: Secondary | ICD-10-CM

## 2022-08-20 DIAGNOSIS — Z86711 Personal history of pulmonary embolism: Secondary | ICD-10-CM

## 2022-08-20 DIAGNOSIS — M810 Age-related osteoporosis without current pathological fracture: Secondary | ICD-10-CM

## 2022-08-20 DIAGNOSIS — I10 Essential (primary) hypertension: Secondary | ICD-10-CM

## 2022-08-20 DIAGNOSIS — M19041 Primary osteoarthritis, right hand: Secondary | ICD-10-CM | POA: Diagnosis not present

## 2022-08-20 DIAGNOSIS — K567 Ileus, unspecified: Secondary | ICD-10-CM

## 2022-08-20 DIAGNOSIS — Z87442 Personal history of urinary calculi: Secondary | ICD-10-CM

## 2022-08-20 DIAGNOSIS — M19042 Primary osteoarthritis, left hand: Secondary | ICD-10-CM

## 2022-08-20 DIAGNOSIS — Z96653 Presence of artificial knee joint, bilateral: Secondary | ICD-10-CM

## 2022-08-20 DIAGNOSIS — Z8719 Personal history of other diseases of the digestive system: Secondary | ICD-10-CM

## 2022-08-20 NOTE — Patient Instructions (Signed)

## 2022-08-27 ENCOUNTER — Ambulatory Visit: Payer: Medicare Other | Admitting: Obstetrics and Gynecology

## 2022-08-28 ENCOUNTER — Other Ambulatory Visit (HOSPITAL_COMMUNITY): Payer: Self-pay

## 2022-08-28 MED ORDER — METOPROLOL SUCCINATE ER 25 MG PO TB24
25.0000 mg | ORAL_TABLET | Freq: Every day | ORAL | 3 refills | Status: AC
Start: 2022-08-28 — End: ?
  Filled 2022-08-28: qty 90, 90d supply, fill #0
  Filled 2023-02-01: qty 90, 90d supply, fill #1

## 2022-08-28 MED ORDER — AMOXICILLIN-POT CLAVULANATE 875-125 MG PO TABS
1.0000 | ORAL_TABLET | Freq: Two times a day (BID) | ORAL | 0 refills | Status: AC
  Filled 2022-08-28: qty 20, 10d supply, fill #0

## 2022-08-29 ENCOUNTER — Other Ambulatory Visit (HOSPITAL_COMMUNITY): Payer: Self-pay

## 2022-08-29 ENCOUNTER — Encounter (HOSPITAL_COMMUNITY): Payer: Self-pay

## 2022-09-06 ENCOUNTER — Telehealth: Payer: Self-pay | Admitting: *Deleted

## 2022-09-06 ENCOUNTER — Other Ambulatory Visit (HOSPITAL_COMMUNITY): Payer: Self-pay

## 2022-09-06 ENCOUNTER — Other Ambulatory Visit: Payer: Self-pay

## 2022-09-06 NOTE — Telephone Encounter (Signed)
Spoke with patient.   Reviewed Prolia with oral surgeon, states ok to resume Prolia.  Vit D lab scheduled for 09/12/22 at 11am Started abx Augmentin for Sinus infection 08/28/22, reports symptoms of vaginal itching, burning, redness, scant white discharge and "off" odor. Denies urinary symptoms, pain or bleeding. Asking if there is an RX for yeast? Advised can try OTC Monistat 1, 3 or 7 day. If symptoms do not resolve, return call to office for OV. Advised I will update Dr. Edward Jolly and d/u if any additional recommendations. Patient agreeable.   Routing to provider for final review. Patient is agreeable to disposition. Will close encounter.  Cc: Petra Kuba for Prolia

## 2022-09-07 ENCOUNTER — Other Ambulatory Visit (HOSPITAL_COMMUNITY): Payer: Self-pay

## 2022-09-12 ENCOUNTER — Other Ambulatory Visit: Payer: Medicare Other

## 2022-09-12 DIAGNOSIS — M8580 Other specified disorders of bone density and structure, unspecified site: Secondary | ICD-10-CM

## 2022-09-13 LAB — VITAMIN D 25 HYDROXY (VIT D DEFICIENCY, FRACTURES): Vit D, 25-Hydroxy: 42 ng/mL (ref 30–100)

## 2022-09-13 NOTE — Telephone Encounter (Signed)
Detailed message left per DPR advising patient that last prolia injection was given on 06-19-22. Will not be due until after 12-20-22. RN advised would reach back out closer to time for prolia to review benefits. Advised to return call to office if any additional questions.   Encounter closed.

## 2022-09-17 ENCOUNTER — Other Ambulatory Visit (HOSPITAL_COMMUNITY): Payer: Self-pay

## 2022-09-17 MED ORDER — VALSARTAN 160 MG PO TABS
240.0000 mg | ORAL_TABLET | Freq: Every day | ORAL | 3 refills | Status: AC
  Filled 2022-09-17: qty 135, 90d supply, fill #0
  Filled 2022-12-17: qty 135, 90d supply, fill #1
  Filled 2023-04-19: qty 135, 90d supply, fill #2

## 2022-09-19 ENCOUNTER — Other Ambulatory Visit (HOSPITAL_COMMUNITY): Payer: Self-pay

## 2022-10-15 NOTE — Progress Notes (Signed)
Patient stated had mano with ph at [facility]  Procedure cancelled at [facility]

## 2022-10-17 ENCOUNTER — Encounter (HOSPITAL_COMMUNITY): Payer: Self-pay

## 2022-10-17 ENCOUNTER — Ambulatory Visit (HOSPITAL_COMMUNITY): Admit: 2022-10-17 | Payer: Medicare Other | Admitting: Gastroenterology

## 2022-10-17 SURGERY — MANOMETRY, ESOPHAGUS
Anesthesia: Choice

## 2022-10-27 ENCOUNTER — Encounter: Payer: Self-pay | Admitting: Cardiology

## 2022-10-27 ENCOUNTER — Other Ambulatory Visit (HOSPITAL_COMMUNITY): Payer: Self-pay

## 2022-10-27 ENCOUNTER — Other Ambulatory Visit: Payer: Self-pay

## 2022-10-27 MED ORDER — AZELASTINE HCL 0.1 % NA SOLN
2.0000 | Freq: Two times a day (BID) | NASAL | 0 refills | Status: DC
  Filled 2022-10-27: qty 30, 25d supply, fill #0

## 2022-10-29 ENCOUNTER — Other Ambulatory Visit (HOSPITAL_COMMUNITY): Payer: Self-pay

## 2022-10-29 ENCOUNTER — Other Ambulatory Visit: Payer: Self-pay

## 2022-10-30 ENCOUNTER — Other Ambulatory Visit (HOSPITAL_COMMUNITY): Payer: Self-pay

## 2022-10-31 ENCOUNTER — Other Ambulatory Visit (HOSPITAL_COMMUNITY): Payer: Self-pay

## 2022-11-01 ENCOUNTER — Other Ambulatory Visit (HOSPITAL_COMMUNITY): Payer: Self-pay

## 2022-11-14 ENCOUNTER — Other Ambulatory Visit (HOSPITAL_COMMUNITY): Payer: Self-pay

## 2022-11-14 MED ORDER — LIRAGLUTIDE 18 MG/3ML ~~LOC~~ SOPN
1.8000 mg | PEN_INJECTOR | Freq: Every day | SUBCUTANEOUS | 0 refills | Status: DC
  Filled 2022-11-14: qty 9, 30d supply, fill #0

## 2022-11-15 ENCOUNTER — Other Ambulatory Visit (HOSPITAL_COMMUNITY): Payer: Self-pay

## 2022-11-15 MED ORDER — LISDEXAMFETAMINE DIMESYLATE 30 MG PO CAPS
30.0000 mg | ORAL_CAPSULE | Freq: Every day | ORAL | 0 refills | Status: DC
Start: 2022-11-15 — End: 2022-12-12
  Filled 2022-11-15: qty 30, 30d supply, fill #0

## 2022-11-15 MED ORDER — LAMOTRIGINE 150 MG PO TABS
150.0000 mg | ORAL_TABLET | Freq: Every day | ORAL | 0 refills | Status: DC
  Filled 2022-11-15: qty 90, 90d supply, fill #0

## 2022-11-15 MED ORDER — LAMOTRIGINE 100 MG PO TABS
100.0000 mg | ORAL_TABLET | Freq: Every day | ORAL | 0 refills | Status: DC
  Filled 2022-11-15: qty 90, 90d supply, fill #0

## 2022-11-15 MED ORDER — SERTRALINE HCL 100 MG PO TABS
100.0000 mg | ORAL_TABLET | Freq: Every day | ORAL | 0 refills | Status: DC
  Filled 2022-11-15: qty 90, 90d supply, fill #0

## 2022-11-16 ENCOUNTER — Other Ambulatory Visit (HOSPITAL_COMMUNITY): Payer: Self-pay

## 2022-11-16 MED ORDER — BUPROPION HCL ER (XL) 300 MG PO TB24
300.0000 mg | ORAL_TABLET | Freq: Every day | ORAL | 0 refills | Status: DC
  Filled 2022-12-17: qty 90, 90d supply, fill #0

## 2022-11-20 ENCOUNTER — Telehealth: Payer: Self-pay | Admitting: *Deleted

## 2022-11-20 NOTE — Telephone Encounter (Signed)
Insurance information submitted to [employer] portal. Will await summary of benefits for prolia.    

## 2022-11-22 NOTE — Telephone Encounter (Signed)
Message left to return call to Sharon Daniels at [phone].   

## 2022-11-22 NOTE — Telephone Encounter (Signed)
Deductible:  none   OOP MAX: $3,500  Annual exam:06-19-22 ML  Calcium:     9.8       Date: 03-22-22  Upcoming dental procedures: Yes  dental extractions on 12/12/22 with oral surgeon, Dr. Retta Mac  Hx of Kidney Disease: No   Last Bone Density Scan: 07-10-22  Is Prior Authorization needed: no  Pt estimated Cost: ~$287.28   Coverage Details:Prolia will be subject to a 20% coinsurance up to a $3500 OOP max ($1,026.70 met). Administration will be covered at 100%. No deductible applies.

## 2022-11-27 NOTE — Telephone Encounter (Signed)
Returned call to patient. Reviewed summary of benefits with patient and she verbalized understanding. Patient states she is scheduled to have dental extraction with Oral Surgeon, Dr. Retta Mac, on 12-12-22. RN advised would need to reach out to Oral Surgeon's office to have ask how long after oral surgery does he recommend she wait for her next Prolia injection that is due 12-20-22. Patient agreeable. Advised could fax pertinent information and records to Dr. Edward Jolly. Fax number provided for patient.   Routing to provider as Lorain Childes.

## 2022-11-27 NOTE — Telephone Encounter (Signed)
I agree with need for update from Dr. Retta Mac regarding proceeding with next Prolia injection due to her dental extraction.

## 2022-12-12 ENCOUNTER — Other Ambulatory Visit (HOSPITAL_COMMUNITY): Payer: Self-pay

## 2022-12-12 MED ORDER — SERTRALINE HCL 100 MG PO TABS
150.0000 mg | ORAL_TABLET | Freq: Every day | ORAL | 0 refills | Status: AC
  Filled 2022-12-12 – 2023-04-19 (×2): qty 135, 90d supply, fill #0

## 2022-12-12 MED ORDER — LISDEXAMFETAMINE DIMESYLATE 30 MG PO CAPS
30.0000 mg | ORAL_CAPSULE | Freq: Every day | ORAL | 0 refills | Status: DC
  Filled 2022-12-13: qty 30, 30d supply, fill #0

## 2022-12-13 ENCOUNTER — Other Ambulatory Visit: Payer: Self-pay

## 2022-12-13 ENCOUNTER — Other Ambulatory Visit (HOSPITAL_COMMUNITY): Payer: Self-pay

## 2022-12-17 ENCOUNTER — Other Ambulatory Visit (HOSPITAL_COMMUNITY): Payer: Self-pay

## 2022-12-24 ENCOUNTER — Other Ambulatory Visit (HOSPITAL_COMMUNITY): Payer: Self-pay

## 2022-12-24 MED ORDER — LIRAGLUTIDE 18 MG/3ML ~~LOC~~ SOPN
1.8000 mg | PEN_INJECTOR | Freq: Every day | SUBCUTANEOUS | 0 refills | Status: AC
  Filled 2022-12-24: qty 9, 30d supply, fill #0

## 2023-01-07 ENCOUNTER — Other Ambulatory Visit (HOSPITAL_COMMUNITY): Payer: Self-pay

## 2023-01-07 MED ORDER — MELOXICAM 7.5 MG PO TABS
7.5000 mg | ORAL_TABLET | Freq: Every day | ORAL | 0 refills | Status: AC
  Filled 2023-01-07: qty 10, 10d supply, fill #0

## 2023-01-15 ENCOUNTER — Other Ambulatory Visit (HOSPITAL_COMMUNITY): Payer: Self-pay

## 2023-01-15 MED ORDER — CELECOXIB 100 MG PO CAPS
100.0000 mg | ORAL_CAPSULE | Freq: Every day | ORAL | 0 refills | Status: AC | PRN
  Filled 2023-01-15 – 2023-01-25 (×2): qty 28, 28d supply, fill #0

## 2023-01-25 ENCOUNTER — Other Ambulatory Visit (HOSPITAL_COMMUNITY): Payer: Self-pay

## 2023-01-28 ENCOUNTER — Other Ambulatory Visit (HOSPITAL_COMMUNITY): Payer: Self-pay

## 2023-01-28 MED ORDER — LISDEXAMFETAMINE DIMESYLATE 30 MG PO CAPS
30.0000 mg | ORAL_CAPSULE | Freq: Every day | ORAL | 0 refills | Status: AC
  Filled 2023-01-28: qty 30, 30d supply, fill #0

## 2023-01-28 MED ORDER — OMEPRAZOLE 40 MG PO CPDR
40.0000 mg | DELAYED_RELEASE_CAPSULE | Freq: Every day | ORAL | 1 refills | Status: DC
  Filled 2023-01-28: qty 145, 73d supply, fill #0
  Filled 2023-04-19: qty 145, 73d supply, fill #1

## 2023-01-31 ENCOUNTER — Other Ambulatory Visit (HOSPITAL_COMMUNITY): Payer: Self-pay

## 2023-01-31 ENCOUNTER — Other Ambulatory Visit: Payer: Self-pay

## 2023-01-31 MED ORDER — LAMOTRIGINE 100 MG PO TABS
100.0000 mg | ORAL_TABLET | Freq: Every day | ORAL | 0 refills | Status: AC
  Filled 2023-01-31: qty 90, 90d supply, fill #0

## 2023-01-31 MED ORDER — LAMOTRIGINE 150 MG PO TABS
150.0000 mg | ORAL_TABLET | Freq: Every day | ORAL | 0 refills | Status: AC
  Filled 2023-01-31 (×2): qty 90, 90d supply, fill #0

## 2023-02-01 ENCOUNTER — Other Ambulatory Visit (HOSPITAL_COMMUNITY): Payer: Self-pay

## 2023-02-07 ENCOUNTER — Other Ambulatory Visit (HOSPITAL_COMMUNITY): Payer: Self-pay

## 2023-02-07 MED ORDER — AMOXICILLIN-POT CLAVULANATE 875-125 MG PO TABS
1.0000 | ORAL_TABLET | Freq: Two times a day (BID) | ORAL | 0 refills | Status: AC
  Filled 2023-02-07: qty 20, 10d supply, fill #0

## 2023-02-07 MED ORDER — ALBUTEROL SULFATE HFA 108 (90 BASE) MCG/ACT IN AERS
2.0000 | INHALATION_SPRAY | RESPIRATORY_TRACT | 2 refills | Status: AC | PRN
  Filled 2023-02-07: qty 13.4, 50d supply, fill #0

## 2023-02-14 ENCOUNTER — Telehealth: Payer: Self-pay

## 2023-02-14 ENCOUNTER — Other Ambulatory Visit: Payer: Self-pay | Admitting: Cardiology

## 2023-02-14 ENCOUNTER — Other Ambulatory Visit: Payer: Self-pay

## 2023-02-14 ENCOUNTER — Other Ambulatory Visit (HOSPITAL_COMMUNITY): Payer: Self-pay

## 2023-02-14 MED ORDER — HYDROCHLOROTHIAZIDE 12.5 MG PO CAPS
12.5000 mg | ORAL_CAPSULE | ORAL | 1 refills | Status: AC
  Filled 2023-02-14: qty 90, 90d supply, fill #0

## 2023-02-14 MED ORDER — AZELASTINE HCL 0.1 % NA SOLN
2.0000 | Freq: Two times a day (BID) | NASAL | 0 refills | Status: AC
  Filled 2023-02-14: qty 30, 30d supply, fill #0

## 2023-02-14 NOTE — Telephone Encounter (Signed)
 Prior authorization initiated through covermymeds for Intrarosa . 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

## 2023-02-19 NOTE — Telephone Encounter (Signed)
PA for Andreas Newport was approved. Patient notified.

## 2023-02-26 ENCOUNTER — Other Ambulatory Visit (HOSPITAL_COMMUNITY): Payer: Self-pay

## 2023-02-26 MED ORDER — CELECOXIB 100 MG PO CAPS
100.0000 mg | ORAL_CAPSULE | Freq: Every day | ORAL | 1 refills | Status: AC
  Filled 2023-02-26: qty 28, 28d supply, fill #0
  Filled 2023-04-19: qty 28, 28d supply, fill #1

## 2023-02-28 ENCOUNTER — Other Ambulatory Visit (HOSPITAL_COMMUNITY): Payer: Self-pay

## 2023-02-28 MED ORDER — BUPROPION HCL ER (XL) 300 MG PO TB24
300.0000 mg | ORAL_TABLET | Freq: Every day | ORAL | 0 refills | Status: DC
  Filled 2023-02-28: qty 90, 90d supply, fill #0

## 2023-02-28 MED ORDER — LAMOTRIGINE 200 MG PO TABS
200.0000 mg | ORAL_TABLET | Freq: Every day | ORAL | 0 refills | Status: DC
  Filled 2023-02-28: qty 90, 90d supply, fill #0

## 2023-03-20 ENCOUNTER — Other Ambulatory Visit (HOSPITAL_COMMUNITY): Payer: Self-pay

## 2023-03-20 MED ORDER — MONTELUKAST SODIUM 10 MG PO TABS
10.0000 mg | ORAL_TABLET | Freq: Every evening | ORAL | 3 refills | Status: AC
  Filled 2023-03-20 – 2023-04-19 (×2): qty 90, 90d supply, fill #0
  Filled 2023-08-08 – 2023-08-21 (×2): qty 90, 90d supply, fill #1

## 2023-03-20 MED ORDER — HYDROCHLOROTHIAZIDE 12.5 MG PO CAPS
12.5000 mg | ORAL_CAPSULE | Freq: Every morning | ORAL | 3 refills | Status: AC
  Filled 2023-04-19: qty 90, 90d supply, fill #0

## 2023-03-20 MED ORDER — VALSARTAN 160 MG PO TABS
240.0000 mg | ORAL_TABLET | Freq: Every day | ORAL | 3 refills | Status: AC
  Filled 2023-03-20 – 2023-04-19 (×2): qty 135, 90d supply, fill #0

## 2023-03-20 MED ORDER — AZELASTINE HCL 137 MCG/SPRAY NA SOLN
2.0000 | Freq: Two times a day (BID) | NASAL | 11 refills | Status: AC
Start: 2023-03-20 — End: ?
  Filled 2023-03-20: qty 30, 30d supply, fill #0

## 2023-04-01 ENCOUNTER — Other Ambulatory Visit (HOSPITAL_COMMUNITY): Payer: Self-pay

## 2023-04-04 ENCOUNTER — Other Ambulatory Visit (HOSPITAL_COMMUNITY): Payer: Self-pay

## 2023-04-04 MED ORDER — SERTRALINE HCL 100 MG PO TABS
200.0000 mg | ORAL_TABLET | Freq: Every day | ORAL | 0 refills | Status: DC
  Filled 2023-04-04 – 2023-04-19 (×2): qty 180, 90d supply, fill #0

## 2023-04-17 ENCOUNTER — Other Ambulatory Visit (HOSPITAL_COMMUNITY): Payer: Self-pay

## 2023-04-19 ENCOUNTER — Other Ambulatory Visit (HOSPITAL_COMMUNITY): Payer: Self-pay

## 2023-04-19 ENCOUNTER — Other Ambulatory Visit: Payer: Self-pay

## 2023-04-19 MED ORDER — ALBUTEROL SULFATE HFA 108 (90 BASE) MCG/ACT IN AERS
2.0000 | INHALATION_SPRAY | RESPIRATORY_TRACT | 2 refills | Status: AC
  Filled 2023-04-19: qty 6.7, 17d supply, fill #0

## 2023-04-23 ENCOUNTER — Other Ambulatory Visit (HOSPITAL_COMMUNITY): Payer: Self-pay

## 2023-04-23 MED ORDER — PRAVASTATIN SODIUM 10 MG PO TABS
10.0000 mg | ORAL_TABLET | ORAL | 0 refills | Status: DC
  Filled 2023-04-23: qty 12, 84d supply, fill #0

## 2023-04-23 MED ORDER — SERTRALINE HCL 100 MG PO TABS
150.0000 mg | ORAL_TABLET | Freq: Every day | ORAL | 3 refills | Status: AC
  Filled 2023-04-23: qty 31, 20d supply, fill #0
  Filled 2023-04-23: qty 114, 76d supply, fill #0

## 2023-04-23 MED ORDER — VALSARTAN 160 MG PO TABS
160.0000 mg | ORAL_TABLET | Freq: Every day | ORAL | 3 refills | Status: AC
Start: 2023-04-23 — End: ?
  Filled 2023-04-23: qty 90, 90d supply, fill #0

## 2023-05-08 ENCOUNTER — Other Ambulatory Visit (HOSPITAL_COMMUNITY): Payer: Self-pay

## 2023-05-09 ENCOUNTER — Other Ambulatory Visit (HOSPITAL_COMMUNITY): Payer: Self-pay

## 2023-05-15 ENCOUNTER — Other Ambulatory Visit (HOSPITAL_COMMUNITY): Payer: Self-pay

## 2023-05-16 ENCOUNTER — Telehealth: Payer: Self-pay

## 2023-05-16 DIAGNOSIS — Z79899 Other long term (current) drug therapy: Secondary | ICD-10-CM

## 2023-05-16 NOTE — Progress Notes (Signed)
 2025 Medication Adherence Report:   - Ozempic  2 mg last filled on 05/07/2023 for 28 days supply, per Dr. Anson Basta. [facility] doesn't allow more than 28 days supply. I called [facility] to verify sold date, but unable to reach staff; left a message asking to return my call.   Alexandria Angel, PharmD Clinical Pharmacist Cell: [phone]

## 2023-05-20 ENCOUNTER — Other Ambulatory Visit (HOSPITAL_COMMUNITY): Payer: Self-pay

## 2023-05-30 ENCOUNTER — Other Ambulatory Visit (HOSPITAL_COMMUNITY): Payer: Self-pay

## 2023-05-30 MED ORDER — LAMOTRIGINE 200 MG PO TABS
200.0000 mg | ORAL_TABLET | Freq: Every day | ORAL | 0 refills | Status: DC
  Filled 2023-05-30: qty 90, 90d supply, fill #0

## 2023-05-30 MED ORDER — AMPHETAMINE-DEXTROAMPHETAMINE 5 MG PO TABS
5.0000 mg | ORAL_TABLET | Freq: Every morning | ORAL | 0 refills | Status: DC
  Filled 2023-05-30: qty 30, 30d supply, fill #0

## 2023-05-30 MED ORDER — SERTRALINE HCL 100 MG PO TABS
200.0000 mg | ORAL_TABLET | Freq: Every day | ORAL | 0 refills | Status: AC
  Filled 2023-05-30: qty 180, 90d supply, fill #0

## 2023-05-30 MED ORDER — BUPROPION HCL ER (XL) 300 MG PO TB24
300.0000 mg | ORAL_TABLET | Freq: Every day | ORAL | 0 refills | Status: DC
  Filled 2023-05-30: qty 90, 90d supply, fill #0

## 2023-06-04 ENCOUNTER — Other Ambulatory Visit (HOSPITAL_COMMUNITY): Payer: Self-pay

## 2023-06-04 MED ORDER — SULFAMETHOXAZOLE-TRIMETHOPRIM 800-160 MG PO TABS
1.0000 | ORAL_TABLET | Freq: Two times a day (BID) | ORAL | 0 refills | Status: DC
  Filled 2023-06-04: qty 6, 3d supply, fill #0

## 2023-06-06 ENCOUNTER — Telehealth: Payer: Self-pay

## 2023-06-06 ENCOUNTER — Other Ambulatory Visit (HOSPITAL_COMMUNITY): Payer: Self-pay

## 2023-06-06 DIAGNOSIS — Z79899 Other long term (current) drug therapy: Secondary | ICD-10-CM

## 2023-06-06 NOTE — Progress Notes (Signed)
    06/06/2023  Patient ID: Sharon Daniels, female   DOB: [birth year], 73 y.o.   MRN: [id number]  2025 Medication Adherence Report:   - Ozempic  2 mg last filled on 06/04/2023 for 28 days supply, per Dr. Anson Basta. Per [facility] Ozempic  2 mg last sold 05/07/2023 and a current prescription ready to be picked up for $45 dollars  Medications not on report yet:  - Per [facility], Valsartan  was last dispensed on 04/24/23 for a 90DS and Pravastatin  was last dispensed 04/24/23 for an 84DS  I called and LVM regarding medication due.   Alexandria Angel, PharmD Clinical Pharmacist Cell: [phone]

## 2023-06-14 ENCOUNTER — Other Ambulatory Visit (HOSPITAL_COMMUNITY): Payer: Self-pay

## 2023-06-14 MED ORDER — AMPHETAMINE-DEXTROAMPHETAMINE 20 MG PO TABS
20.0000 mg | ORAL_TABLET | Freq: Every day | ORAL | 0 refills | Status: DC
  Filled 2023-06-14: qty 30, 30d supply, fill #0

## 2023-06-21 ENCOUNTER — Telehealth: Payer: Self-pay

## 2023-06-21 NOTE — Progress Notes (Signed)
   06/21/2023  Patient ID: Sharon Daniels, female   DOB: [birth year], 73 y.o.   MRN: [id number]  Reviewed patient regarding medication adherence from a quality report for [name] MD.    Per [facility] and payer portal fill history: Ozempic  2 mg - last filled 06/04/23 for a 28-day supply. Pravastatin  10 mg - last filled 04/23/23 for an 84-day supply. Valsartan  160 mg - last filled 04/19/23 for a 90-day supply.    Left HIPAA compliant voicemail for patient to return my call, for medication assistance screening for Ozempic . I will follow up for adherence monitoring.  Thank you for allowing pharmacy to be a part of this patient's care.    Livia Riffle, PharmD Clinical Pharmacist  [phone]

## 2023-07-05 ENCOUNTER — Other Ambulatory Visit (HOSPITAL_COMMUNITY): Payer: Self-pay

## 2023-07-05 MED ORDER — AMPHETAMINE-DEXTROAMPHETAMINE 20 MG PO TABS
20.0000 mg | ORAL_TABLET | Freq: Every day | ORAL | 0 refills | Status: DC
  Filled 2023-07-08: qty 9, 9d supply, fill #0
  Filled 2023-07-08: qty 21, 21d supply, fill #0

## 2023-07-08 ENCOUNTER — Other Ambulatory Visit (HOSPITAL_COMMUNITY): Payer: Self-pay

## 2023-07-09 ENCOUNTER — Other Ambulatory Visit (HOSPITAL_COMMUNITY): Payer: Self-pay

## 2023-07-09 ENCOUNTER — Encounter: Payer: Self-pay | Admitting: Pharmacist

## 2023-07-09 MED ORDER — PILOCARPINE HCL 7.5 MG PO TABS
7.5000 mg | ORAL_TABLET | Freq: Three times a day (TID) | ORAL | 3 refills | Status: AC
  Filled 2023-07-09: qty 90, 30d supply, fill #0

## 2023-07-09 MED ORDER — OMEPRAZOLE 40 MG PO CPDR
40.0000 mg | DELAYED_RELEASE_CAPSULE | Freq: Every day | ORAL | 1 refills | Status: AC
  Filled 2023-07-09: qty 145, 73d supply, fill #0

## 2023-07-09 MED ORDER — PRAVASTATIN SODIUM 10 MG PO TABS
10.0000 mg | ORAL_TABLET | ORAL | 0 refills | Status: AC
  Filled 2023-07-09: qty 12, 84d supply, fill #0

## 2023-07-11 ENCOUNTER — Other Ambulatory Visit (HOSPITAL_COMMUNITY): Payer: Self-pay

## 2023-07-18 ENCOUNTER — Other Ambulatory Visit (HOSPITAL_COMMUNITY): Payer: Self-pay

## 2023-07-18 MED ORDER — AMPHETAMINE-DEXTROAMPHET ER 20 MG PO CP24
20.0000 mg | ORAL_CAPSULE | Freq: Every day | ORAL | 0 refills | Status: DC
  Filled 2023-07-18: qty 30, 30d supply, fill #0

## 2023-07-30 ENCOUNTER — Other Ambulatory Visit (HOSPITAL_COMMUNITY): Payer: Self-pay

## 2023-07-30 MED ORDER — VALSARTAN 160 MG PO TABS
160.0000 mg | ORAL_TABLET | Freq: Every day | ORAL | 3 refills | Status: AC
  Filled 2023-07-30: qty 90, 90d supply, fill #0

## 2023-07-30 MED ORDER — MONTELUKAST SODIUM 10 MG PO TABS
10.0000 mg | ORAL_TABLET | Freq: Every evening | ORAL | 3 refills | Status: AC
  Filled 2023-07-30: qty 90, 90d supply, fill #0

## 2023-08-08 ENCOUNTER — Other Ambulatory Visit (HOSPITAL_COMMUNITY): Payer: Self-pay

## 2023-08-13 ENCOUNTER — Other Ambulatory Visit (HOSPITAL_COMMUNITY): Payer: Self-pay

## 2023-08-13 ENCOUNTER — Encounter: Payer: Self-pay | Admitting: Radiology

## 2023-08-13 ENCOUNTER — Other Ambulatory Visit: Payer: Self-pay

## 2023-08-13 MED ORDER — LAMOTRIGINE 200 MG PO TABS
200.0000 mg | ORAL_TABLET | Freq: Every day | ORAL | 0 refills | Status: AC
  Filled 2023-08-13: qty 90, 90d supply, fill #0

## 2023-08-13 MED ORDER — AMPHETAMINE-DEXTROAMPHET ER 20 MG PO CP24
20.0000 mg | ORAL_CAPSULE | Freq: Every day | ORAL | 0 refills | Status: AC
  Filled 2023-08-21: qty 30, 30d supply, fill #0

## 2023-08-13 MED ORDER — AMPHETAMINE-DEXTROAMPHETAMINE 20 MG PO TABS: 20.0000 mg | ORAL_TABLET | Freq: Every day | ORAL | 0 refills | Status: AC

## 2023-08-13 MED ORDER — AMPHETAMINE-DEXTROAMPHET ER 20 MG PO CP24: 20.0000 mg | ORAL_CAPSULE | Freq: Every day | ORAL | 0 refills | Status: AC

## 2023-08-13 MED ORDER — SERTRALINE HCL 100 MG PO TABS
200.0000 mg | ORAL_TABLET | Freq: Every day | ORAL | 0 refills | Status: AC
  Filled 2023-08-13: qty 180, 90d supply, fill #0

## 2023-08-13 MED ORDER — BUPROPION HCL ER (XL) 300 MG PO TB24: 300.0000 mg | ORAL_TABLET | Freq: Every day | ORAL | 0 refills | Status: AC

## 2023-08-13 MED ORDER — AMPHETAMINE-DEXTROAMPHETAMINE 20 MG PO TABS
20.0000 mg | ORAL_TABLET | Freq: Every day | ORAL | 0 refills | Status: AC
  Filled 2023-08-13: qty 30, 30d supply, fill #0

## 2023-08-14 ENCOUNTER — Ambulatory Visit (INDEPENDENT_AMBULATORY_CARE_PROVIDER_SITE_OTHER): Admitting: Radiology

## 2023-08-14 ENCOUNTER — Other Ambulatory Visit (HOSPITAL_COMMUNITY)
Admission: RE | Admit: 2023-08-14 | Discharge: 2023-08-14 | Disposition: A | Discharge status: Home / Self Care | Source: Ambulatory Visit | Attending: Radiology | Admitting: Radiology

## 2023-08-14 ENCOUNTER — Encounter: Payer: Self-pay | Admitting: Radiology

## 2023-08-14 VITALS — BP 130/82 | HR 95 | Ht 66.25 in | Wt 164.0 lb

## 2023-08-14 DIAGNOSIS — Z01419 Encounter for gynecological examination (general) (routine) without abnormal findings: Secondary | ICD-10-CM

## 2023-08-14 DIAGNOSIS — N952 Postmenopausal atrophic vaginitis: Secondary | ICD-10-CM

## 2023-08-14 DIAGNOSIS — Z780 Asymptomatic menopausal state: Secondary | ICD-10-CM | POA: Diagnosis not present

## 2023-08-14 DIAGNOSIS — M810 Age-related osteoporosis without current pathological fracture: Secondary | ICD-10-CM | POA: Diagnosis not present

## 2023-08-14 DIAGNOSIS — Z9189 Other specified personal risk factors, not elsewhere classified: Secondary | ICD-10-CM | POA: Diagnosis not present

## 2023-08-14 DIAGNOSIS — Z124 Encounter for screening for malignant neoplasm of cervix: Secondary | ICD-10-CM | POA: Diagnosis not present

## 2023-08-14 DIAGNOSIS — Z1151 Encounter for screening for human papillomavirus (HPV): Secondary | ICD-10-CM | POA: Insufficient documentation

## 2023-08-14 NOTE — Progress Notes (Signed)
   Sharon Daniels [birth year] [id number]   History: Postmenopausal 73 y.o. presents for annual exam. Using intrarosa  PRN. Ready to restart prolia - had taken a break for dental work.   Gynecologic History Postmenopausal Last Pap: 2020 normal Last mammogram: 2021 Last colonoscopy: 2024 DEXA: osteoporosis 2024 HRT use: no  Risk Factors for Medicare Patients  >/= 5 sexual partners in a lifetime: Yes  First intercourse <38 years of age: No  H/O STD at any age: No  Abnormal pap smear, < 3 negative paps within the last 7 years: No  DES exposure (women born between [id number]): No  Patient is on post breast cancer medication like Femara or, if medication like this is not needed, 5 years post breast cancer diagnosis: No   Obstetric History OB History  Gravida Para Term Preterm AB Living  3 2 2  1 2   SAB IAB Ectopic Multiple Live Births  1        # Outcome Date GA Lbr Len/2nd Weight Sex Type Anes PTL Lv  3 SAB           2 Term           1 Term                The following portions of the patient's history were reviewed and updated as appropriate: allergies, current medications, past family history, past medical history, past social history, past surgical history, and problem list.  Review of Systems Pertinent items noted in HPI and remainder of comprehensive ROS otherwise negative.  Past medical history, past surgical history, family history and social history were all reviewed and documented in the EPIC chart.  Exam:  Vitals:   08/14/23 1457  BP: 130/82  Pulse: 95  SpO2: 97%  Weight: 164 lb (74.4 kg)  Height: 5' 6.25 (1.683 m)   Body mass index is 26.27 kg/m.  General appearance:  Normal Thyroid:  Symmetrical, normal in size, without palpable masses or nodularity. Respiratory  Auscultation:  Clear without wheezing or rhonchi Cardiovascular  Auscultation:  Regular rate, without rubs, murmurs or gallops  Edema/varicosities:  Not grossly  evident Abdominal  Soft,nontender, without masses, guarding or rebound.  Liver/spleen:  No organomegaly noted  Hernia:  None appreciated  Skin  Inspection:  Grossly normal Breasts: Examined lying and sitting.   Right: Without masses, retractions, nipple discharge or axillary adenopathy.   Left: Without masses, retractions, nipple discharge or axillary adenopathy. Genitourinary   Inguinal/mons:  Normal without inguinal adenopathy  External genitalia:  Normal appearing vulva with no masses, tenderness, or lesions  BUS/Urethra/Skene's glands:  Normal  Vagina:  Normal appearing with normal color and discharge, no lesions. Atrophy: moderate   Cervix:  Normal appearing without discharge or lesions  Uterus:  Normal in size, shape and contour.  Midline and mobile, nontender  Adnexa/parametria:     Rt: Normal in size, without masses or tenderness.   Lt: Normal in size, without masses or tenderness.  Anus and perineum: Normal    Darice Hoit, CMA present for exam  Assessment/Plan:   1. Well female exam with routine gynecological exam (Primary) - Mammo yearly _ continue intrarosa , increase to at least 3 times a week  does not need a refill at this time - Cytology - PAP( [place])   Return in 1 year for annual or sooner prn.  Matthan Sledge B WHNP-BC, 3:08 PM 08/14/2023

## 2023-08-19 LAB — CYTOLOGY - PAP
Comment: NEGATIVE
Diagnosis: NEGATIVE
High risk HPV: NEGATIVE

## 2023-08-20 ENCOUNTER — Ambulatory Visit: Payer: Self-pay | Admitting: Radiology

## 2023-08-20 NOTE — Telephone Encounter (Signed)
 Sharon Sharlet LITTIE Meliton Damien ONEIDA, RN; Sharon Kate SAILOR, RN This pt is here in office today for an annual and wants to know what is going on with starting her prolia  shots. She said it was postpone due to dental work. She said she reached out to [facility] about a month or two ago and hasn't heard back from her  Spoke with patient. Advised per last conversation with Damien and recommendations per Dr. Nikki. Advised patient I will contact Dr. Buddy office for recommendations and have Damien f/u once this has been received. Patient agreeable.   Call placed to Dr. Mohorn at [phone], spoke with [place]. Dental extraction on 01/01/23, can patient proceed with Prolia  injections? Return call to [place] at [phone] or fax response to [phone].   Routing to [place] for f/u

## 2023-08-21 ENCOUNTER — Other Ambulatory Visit (HOSPITAL_COMMUNITY): Payer: Self-pay

## 2023-08-22 ENCOUNTER — Other Ambulatory Visit (HOSPITAL_COMMUNITY): Payer: Self-pay

## 2023-08-22 MED ORDER — METHYLPREDNISOLONE 4 MG PO TBPK
ORAL_TABLET | ORAL | 0 refills | Status: AC
  Filled 2023-08-22: qty 21, 6d supply, fill #0

## 2023-08-22 MED ORDER — AMOXICILLIN-POT CLAVULANATE 875-125 MG PO TABS
1.0000 | ORAL_TABLET | Freq: Two times a day (BID) | ORAL | 0 refills | Status: AC
  Filled 2023-08-22: qty 20, 10d supply, fill #0

## 2023-08-23 NOTE — Telephone Encounter (Signed)
 Insurance information submitted to [employer] portal. Will await summary of benefits for prolia .

## 2023-08-29 ENCOUNTER — Other Ambulatory Visit: Payer: Self-pay | Admitting: *Deleted

## 2023-08-29 DIAGNOSIS — M810 Age-related osteoporosis without current pathological fracture: Secondary | ICD-10-CM

## 2023-08-29 MED ORDER — DENOSUMAB 60 MG/ML ~~LOC~~ SOSY
60.0000 mg | PREFILLED_SYRINGE | Freq: Once | SUBCUTANEOUS | Status: AC
  Administered 2023-09-16: 60 mg via SUBCUTANEOUS

## 2023-09-05 ENCOUNTER — Ambulatory Visit

## 2023-09-05 ENCOUNTER — Ambulatory Visit (INDEPENDENT_AMBULATORY_CARE_PROVIDER_SITE_OTHER)

## 2023-09-05 DIAGNOSIS — M810 Age-related osteoporosis without current pathological fracture: Secondary | ICD-10-CM

## 2023-09-16 DIAGNOSIS — M810 Age-related osteoporosis without current pathological fracture: Secondary | ICD-10-CM | POA: Diagnosis not present

## 2023-09-18 ENCOUNTER — Other Ambulatory Visit (HOSPITAL_BASED_OUTPATIENT_CLINIC_OR_DEPARTMENT_OTHER): Payer: Self-pay

## 2023-09-18 ENCOUNTER — Other Ambulatory Visit (HOSPITAL_COMMUNITY): Payer: Self-pay

## 2024-01-28 ENCOUNTER — Other Ambulatory Visit: Payer: Self-pay | Admitting: *Deleted

## 2024-01-28 MED ORDER — DENOSUMAB 60 MG/ML ~~LOC~~ SOSY: 60.0000 mg | PREFILLED_SYRINGE | SUBCUTANEOUS | Status: AC

## 2024-08-19 ENCOUNTER — Encounter: Admitting: Radiology
